# Patient Record
Sex: Male | Born: 1960 | Hispanic: No | Marital: Married | Smoking: Current every day smoker
Health system: Southern US, Community
[De-identification: ages and names within clinical notes are randomized; demographics above are authoritative.]

## PROBLEM LIST (undated history)

## (undated) DIAGNOSIS — M1990 Unspecified osteoarthritis, unspecified site: Secondary | ICD-10-CM

## (undated) DIAGNOSIS — G4733 Obstructive sleep apnea (adult) (pediatric): Secondary | ICD-10-CM

## (undated) DIAGNOSIS — M25569 Pain in unspecified knee: Secondary | ICD-10-CM

## (undated) DIAGNOSIS — E785 Hyperlipidemia, unspecified: Secondary | ICD-10-CM

## (undated) DIAGNOSIS — M48062 Spinal stenosis, lumbar region with neurogenic claudication: Secondary | ICD-10-CM

## (undated) DIAGNOSIS — M5137 Other intervertebral disc degeneration, lumbosacral region: Secondary | ICD-10-CM

## (undated) DIAGNOSIS — R7303 Prediabetes: Secondary | ICD-10-CM

## (undated) DIAGNOSIS — N200 Calculus of kidney: Secondary | ICD-10-CM

## (undated) DIAGNOSIS — G4730 Sleep apnea, unspecified: Secondary | ICD-10-CM

## (undated) DIAGNOSIS — I10 Essential (primary) hypertension: Secondary | ICD-10-CM

## (undated) DIAGNOSIS — G8929 Other chronic pain: Secondary | ICD-10-CM

## (undated) DIAGNOSIS — M51379 Other intervertebral disc degeneration, lumbosacral region without mention of lumbar back pain or lower extremity pain: Secondary | ICD-10-CM

## (undated) DIAGNOSIS — M549 Dorsalgia, unspecified: Secondary | ICD-10-CM

## (undated) DIAGNOSIS — R29898 Other symptoms and signs involving the musculoskeletal system: Secondary | ICD-10-CM

## (undated) DIAGNOSIS — F329 Major depressive disorder, single episode, unspecified: Secondary | ICD-10-CM

## (undated) DIAGNOSIS — F411 Generalized anxiety disorder: Secondary | ICD-10-CM

## (undated) DIAGNOSIS — F419 Anxiety disorder, unspecified: Secondary | ICD-10-CM

## (undated) DIAGNOSIS — Z87442 Personal history of urinary calculi: Secondary | ICD-10-CM

---

## 2006-09-02 ENCOUNTER — Ambulatory Visit: Payer: Self-pay | Admitting: Internal Medicine

## 2006-09-02 ENCOUNTER — Ambulatory Visit: Payer: Self-pay | Admitting: *Deleted

## 2011-10-18 ENCOUNTER — Emergency Department (HOSPITAL_COMMUNITY)
Admission: EM | Admit: 2011-10-18 | Discharge: 2011-10-19 | Disposition: A | Discharge status: Home / Self Care | Payer: Self-pay | Attending: Emergency Medicine | Admitting: Emergency Medicine

## 2011-10-18 ENCOUNTER — Emergency Department (HOSPITAL_COMMUNITY): Payer: Self-pay

## 2011-10-18 ENCOUNTER — Encounter (HOSPITAL_COMMUNITY): Payer: Self-pay | Admitting: *Deleted

## 2011-10-18 DIAGNOSIS — N200 Calculus of kidney: Secondary | ICD-10-CM | POA: Insufficient documentation

## 2011-10-18 DIAGNOSIS — R109 Unspecified abdominal pain: Secondary | ICD-10-CM | POA: Insufficient documentation

## 2011-10-18 DIAGNOSIS — F17200 Nicotine dependence, unspecified, uncomplicated: Secondary | ICD-10-CM | POA: Insufficient documentation

## 2011-10-18 DIAGNOSIS — I10 Essential (primary) hypertension: Secondary | ICD-10-CM | POA: Insufficient documentation

## 2011-10-18 DIAGNOSIS — Z79899 Other long term (current) drug therapy: Secondary | ICD-10-CM | POA: Insufficient documentation

## 2011-10-18 HISTORY — DX: Essential (primary) hypertension: I10

## 2011-10-18 HISTORY — DX: Pain in unspecified knee: M25.569

## 2011-10-18 HISTORY — DX: Other chronic pain: G89.29

## 2011-10-18 HISTORY — DX: Calculus of kidney: N20.0

## 2011-10-18 LAB — URINALYSIS, ROUTINE W REFLEX MICROSCOPIC
Ketones, ur: NEGATIVE mg/dL
Nitrite: NEGATIVE
Protein, ur: NEGATIVE mg/dL
pH: 5.5 (ref 5.0–8.0)

## 2011-10-18 LAB — URINE MICROSCOPIC-ADD ON

## 2011-10-18 NOTE — ED Notes (Signed)
Pt c/o R flank pain starting this morning. Pt states vomiting x 1 today. Pt denies hematuria at this time. Pt took ibuprofen at noon w/ slight relief.

## 2011-10-18 NOTE — ED Provider Notes (Signed)
History     CSN: [id number]  Arrival date & time 10/18/11  2145   First MD Initiated Contact with Patient 10/18/11 2301      Chief Complaint  Patient presents with  . Flank Pain    (Consider location/radiation/quality/duration/timing/severity/associated sxs/prior treatment) HPI Comments: Patient noticed, right flank pain.  That developed this morning.  It has waxed and waned in nature.  Since, then, at 9 PM tonight, approximately 2 hours ago.  Pain increased in severity.  Patient became nauseated and vomited times once at this time.  The pain is minimal.  He does have a history of kidney stands approximately 10 years ago.  He was hospitalized for this.  At that time.  He is unsure, if he passed the stone, but he's had no difficulty since he does have a history of high blood pressure, for which he takes atenolol and lisinopril.  He recently moved to [facility], and is looking to establish with a primary care physician  Patient is a 51 y.o. male presenting with flank pain. The history is provided by the patient.  Flank Pain This is a new problem. The current episode started today. The problem has been waxing and waning. Associated symptoms include nausea and vomiting. Pertinent negatives include no abdominal pain, change in bowel habit, coughing, fever or weakness. Nothing aggravates the symptoms. He has tried nothing for the symptoms.    Past Medical History  Diagnosis Date  . Kidney stone   . Hypertension   . Chronic knee pain     History reviewed. No pertinent past surgical history.  History reviewed. No pertinent family history.  History  Substance Use Topics  . Smoking status: Current Everyday Smoker -- 0.5 packs/day    Types: Cigarettes  . Smokeless tobacco: Not on file  . Alcohol Use: No      Review of Systems  Constitutional: Negative for fever.  Respiratory: Negative for cough.   Gastrointestinal: Positive for nausea and vomiting. Negative for abdominal pain and  change in bowel habit.  Genitourinary: Positive for flank pain. Negative for dysuria, hematuria and testicular pain.  Neurological: Negative for weakness.    Allergies  Review of patient's allergies indicates no known allergies.  Home Medications   Current Outpatient Rx  Name Route Sig Dispense Refill  . ATENOLOL 25 MG PO TABS Oral Take 25 mg by mouth 2 (two) times daily.    Marland Kitchen LISINOPRIL 20 MG PO TABS Oral Take 20 mg by mouth daily.    . MELOXICAM 15 MG PO TABS Oral Take 15 mg by mouth daily.    Marland Kitchen HYDROCODONE-ACETAMINOPHEN 5-500 MG PO TABS Oral Take 1-2 tablets by mouth every 6 (six) hours as needed for pain. 15 tablet 0  . TAMSULOSIN HCL 0.4 MG PO CAPS Oral Take 1 capsule (0.4 mg total) by mouth daily. 20 capsule 0    BP 188/74  Pulse 66  Temp 98.7 F (37.1 C) (Oral)  Resp 16  SpO2 100%  Physical Exam  Constitutional: He is oriented to person, place, and time. He appears well-developed and well-nourished.  HENT:  Head: Normocephalic.  Eyes: Pupils are equal, round, and reactive to light.  Neck: Normal range of motion.  Cardiovascular: Normal rate.   Pulmonary/Chest: Effort normal.  Abdominal: Soft. He exhibits no distension.  Genitourinary: Penis normal.  Musculoskeletal: Normal range of motion.       Arms: Neurological: He is alert and oriented to person, place, and time.  Skin: Skin is warm.  ED Course  Procedures (including critical care time)  Labs Reviewed  URINALYSIS, ROUTINE W REFLEX MICROSCOPIC - Abnormal; Notable for the following:    APPearance TURBID (*)     Hgb urine dipstick LARGE (*)     Leukocytes, UA SMALL (*)     All other components within normal limits  POCT I-STAT, CHEM 8 - Abnormal; Notable for the following:    Glucose, Bld 112 (*)     All other components within normal limits  URINE MICROSCOPIC-ADD ON   Ct Abdomen Pelvis Wo Contrast  10/18/2011  *RADIOLOGY REPORT*  Clinical Data: Right flank pain  CT ABDOMEN AND PELVIS WITHOUT  CONTRAST  Technique:  Multidetector CT imaging of the abdomen and pelvis was performed following the standard protocol without intravenous contrast.  Comparison: None.  Findings: Moderate right hydronephrosis and perinephric stranding. 3 mm calculus in the lower pole of the left kidney.  2 mm calculus at the right ureteral vesicle junction.  Normal appendix.  Decompressed gallbladder.  Liver, spleen, pancreas, adrenal glands are within normal limits.  No acute bony deformity.  IMPRESSION: Right hydronephrosis associated with a 2 mm right ureteral vesicle junction calculus.  Left nephrolithiasis.  Original Report Authenticated By: Donavan Burnet, M.D.     1. Kidney stone       MDM  Flank pain         Arman Filter, NP 10/19/11 1610  Arman Filter, NP 10/19/11 [phone]

## 2011-10-18 NOTE — ED Notes (Signed)
Report received from previous RN. Family at bedside. Pt gone to radiology.

## 2011-10-19 LAB — POCT I-STAT, CHEM 8
BUN: 21 mg/dL (ref 6–23)
Calcium, Ion: 1.16 mmol/L (ref 1.12–1.23)
Creatinine, Ser: 1.1 mg/dL (ref 0.50–1.35)
Glucose, Bld: 112 mg/dL — ABNORMAL HIGH (ref 70–99)
TCO2: 23 mmol/L (ref 0–100)

## 2011-10-19 MED ORDER — HYDROCODONE-ACETAMINOPHEN 5-500 MG PO TABS: 1.0000 | ORAL_TABLET | Freq: Four times a day (QID) | ORAL | Status: AC | PRN

## 2011-10-19 MED ORDER — TAMSULOSIN HCL 0.4 MG PO CAPS: 0.4000 mg | ORAL_CAPSULE | Freq: Every day | ORAL | Status: DC

## 2011-10-19 MED ORDER — TAMSULOSIN HCL 0.4 MG PO CAPS
0.4000 mg | ORAL_CAPSULE | Freq: Once | ORAL | Status: AC
  Administered 2011-10-19: 0.4 mg via ORAL
  Filled 2011-10-19: qty 1

## 2011-10-19 MED ORDER — KETOROLAC TROMETHAMINE 30 MG/ML IJ SOLN
30.0000 mg | Freq: Once | INTRAMUSCULAR | Status: AC
  Administered 2011-10-19: 30 mg via INTRAVENOUS
  Filled 2011-10-19: qty 1

## 2011-10-19 NOTE — ED Notes (Signed)
Pt becoming increasingly aggravated and wants to leave the ED. States he "has to be at work at [employer]." Have informed ERP of the pts status.

## 2011-10-19 NOTE — ED Notes (Signed)
Pt requesting update from ERP regarding CT scan results

## 2011-10-19 NOTE — ED Provider Notes (Signed)
Medical screening examination/treatment/procedure(s) were performed by non-physician practitioner and as supervising physician I was immediately available for consultation/collaboration.   Celene Kras, MD 10/19/11 [phone]

## 2011-10-31 ENCOUNTER — Ambulatory Visit: Payer: Self-pay | Admitting: Family Medicine

## 2011-11-07 ENCOUNTER — Ambulatory Visit (INDEPENDENT_AMBULATORY_CARE_PROVIDER_SITE_OTHER): Payer: Self-pay | Admitting: Family Medicine

## 2011-11-07 ENCOUNTER — Encounter: Payer: Self-pay | Admitting: Family Medicine

## 2011-11-07 VITALS — BP 190/83 | HR 60 | Ht 66.0 in | Wt 217.0 lb

## 2011-11-07 DIAGNOSIS — I10 Essential (primary) hypertension: Secondary | ICD-10-CM | POA: Insufficient documentation

## 2011-11-07 DIAGNOSIS — Z87442 Personal history of urinary calculi: Secondary | ICD-10-CM | POA: Insufficient documentation

## 2011-11-07 NOTE — Assessment & Plan Note (Signed)
Patient awaiting orange card - will increase Lisinopril to 40 mg daily. Continue Atenolol 25 BID. Red flags reviewed, warning signs discussed. Return to clinic after patient receives orange card.

## 2011-11-07 NOTE — Progress Notes (Signed)
  Subjective:    Christian [name] is a 51 y.o. male new patient to our practice, who presents for evaluation of elevated blood pressures.   Patient has not seen a primary care physician in over one year.  He has been taking blood pressure medications daily, goes to urgent care centers for refills. Cardiac symptoms: none. Patient denies: chest pain, chest pressure/discomfort, dyspnea, fatigue, irregular heart beat, lower extremity edema, near-syncope and syncope. Cardiovascular risk factors: male gender and smoking/ tobacco exposure. Use of agents associated with hypertension: none.  He does not adhere to a low-sodium diet, he does not exercise daily but works at [employer] and is on his feet most of the time.  Patient smokes about half a pack per day. He is interested in quitting.  I have reviewed patient's past medical history, family history, social history.   Review of Systems  Per history of present illness  Objective:    BP 190/83  Pulse 60  Ht 5\' 6"  (1.676 m)  Wt 217 lb (98.431 kg)  BMI 35.02 kg/m2 General appearance: alert, cooperative and no distress Lungs: clear to auscultation bilaterally Heart: regular rate and rhythm, S1, S2 normal, no murmur, click, rub or gallop Neuro:  no focal deficit  Assessment:     hypertension, poorly controlled.   Plan:    see problem list

## 2011-11-07 NOTE — Patient Instructions (Signed)
Take Lisinopril 2 tablets daily. Once you get orange card, schedule follow up appointment with PCP at your earliest convenience.

## 2012-11-03 ENCOUNTER — Encounter: Payer: Self-pay | Admitting: Diagnostic Neuroimaging

## 2012-11-03 ENCOUNTER — Ambulatory Visit (INDEPENDENT_AMBULATORY_CARE_PROVIDER_SITE_OTHER): Payer: No Typology Code available for payment source | Admitting: Diagnostic Neuroimaging

## 2012-11-03 VITALS — BP 131/74 | HR 57 | Temp 97.8°F | Ht 65.5 in | Wt 225.0 lb

## 2012-11-03 DIAGNOSIS — M4802 Spinal stenosis, cervical region: Secondary | ICD-10-CM | POA: Insufficient documentation

## 2012-11-03 DIAGNOSIS — M5010 Cervical disc disorder with radiculopathy, unspecified cervical region: Secondary | ICD-10-CM

## 2012-11-03 DIAGNOSIS — M5412 Radiculopathy, cervical region: Secondary | ICD-10-CM

## 2012-11-03 NOTE — Progress Notes (Signed)
[facility]  PATIENT: Christian [name] DOB: [birth year]  REFERRING CLINICIAN: Cephus Richer / Williams HISTORY FROM: patient (accompanied by wife and son) REASON FOR VISIT: new consult   HISTORICAL  CHIEF COMPLAINT:  Chief Complaint  Patient presents with  . Numbness    arms when sleeping, burning sensation  . Shaking    R hand    HISTORY OF PRESENT ILLNESS:   52 year old right-handed male here for evaluation of bilateral upper extremity numbness, weakness, burning sensation in the neck and abnormal MRI.  For past 2 years patient has developed intermittent numbness in his right and left arms, forearm region. He has noticed some progression of symptoms over time. He also feels some mild weakness in his arms. Also he notes a burning shooting pain from his mid cervical region down the length of his spine down to his feet when he tilts his head back and looks up. He had a similar sensation when he reaches his arms overhead. He denies any bowel or bladder dysfunction. No numbness or weakness in his abdomen, chest, lower extremities. No numbness or tingling in his feet or toes.  Patient was evaluated by PCP, who ordered MRI cervical, thoracic and lumbar spine, which were performed in each. Patient returns with films and reports, which mention multilevel degenerative changes with spinal cord compression in the cervical spine region mainly at C4-5, C5-6 and C6-7, with some cord signal abnormalities. Disc protrusion is also noted in the thoracic spine at T5-6, T6-7. Degenerative changes and lumbar spine also noted with multilevel lumbar spinal stenosis from L2 through the L4-5. Multilevel foraminal stenosis also noted cervical and lumbar regions.  REVIEW OF SYSTEMS: Full 14 system review of systems performed and notable only for snoring, numbness.  ALLERGIES: No Known Allergies  HOME MEDICATIONS: Prior to Admission medications   Medication Sig Start Date End Date Taking?  Authorizing Provider  atenolol (TENORMIN) 25 MG tablet Take 25 mg by mouth 2 (two) times daily.   Yes Historical Provider, MD  celecoxib (CELEBREX) 200 MG capsule Take 200 mg by mouth daily.   Yes Historical Provider, MD  lisinopril (PRINIVIL,ZESTRIL) 20 MG tablet Take 20 mg by mouth daily.   Yes Historical Provider, MD  lovastatin (MEVACOR) 40 MG tablet Take 40 mg by mouth at bedtime.   Yes Historical Provider, MD   Outpatient Prescriptions Prior to Visit  Medication Sig Dispense Refill  . atenolol (TENORMIN) 25 MG tablet Take 25 mg by mouth 2 (two) times daily.      Marland Kitchen lisinopril (PRINIVIL,ZESTRIL) 20 MG tablet Take 20 mg by mouth daily.      . meloxicam (MOBIC) 15 MG tablet Take 15 mg by mouth daily.      . Tamsulosin HCl (FLOMAX) 0.4 MG CAPS Take 1 capsule (0.4 mg total) by mouth daily.  20 capsule  0   No facility-administered medications prior to visit.    PAST MEDICAL HISTORY: Past Medical History  Diagnosis Date  . Hypertension   . Chronic knee pain   . Kidney stone August 2013    PAST SURGICAL HISTORY: History reviewed. No pertinent past surgical history.  FAMILY HISTORY: History reviewed. No pertinent family history.  SOCIAL HISTORY:  History   Social History  . Marital Status: Married    Spouse Name: [name]    Number of Children: 4  . Years of Education: College   Occupational History  .  [employer]   Social History Main Topics  . Smoking status: Current Every Day  Smoker -- 0.50 packs/day for 22 years    Types: Cigarettes  . Smokeless tobacco: Never Used  . Alcohol Use: No  . Drug Use: No  . Sexual Activity: Yes    Birth Control/ Protection: None   Other Topics Concern  . Not on file   Social History Narrative   Lives at home with family.   Caffeine Use: 1 cup daily     PHYSICAL EXAM  Filed Vitals:   11/03/12 1008  BP: 131/74  Pulse: 57  Temp: 97.8 F (36.6 C)  TempSrc: Oral  Height: 5' 5.5" (1.664 m)  Weight: 225 lb (102.059 kg)     Not recorded    Body mass index is 36.86 kg/(m^2).  GENERAL EXAM: Patient is in no distress  CARDIOVASCULAR: Regular rate and rhythm, no murmurs, no carotid bruits  NEUROLOGIC: MENTAL STATUS: awake, alert, language fluent, comprehension intact, naming intact CRANIAL NERVE: no papilledema on fundoscopic exam, pupils equal and reactive to light, visual fields full to confrontation, extraocular muscles intact, no nystagmus, facial sensation and strength symmetric, uvula midline, shoulder shrug symmetric, tongue midline. MOTOR: normal bulk and tone, BUE (TRICEPS 3-4, OTHERWISE 5). BLE 5.  SENSORY: normal and symmetric to light touch, pinprick, temperature, vibration COORDINATION: finger-nose-finger, fine finger movements normal REFLEXES: BUE 2, KNEES 3, ANKLES 2. UPGOING TOES BILATERALLY GAIT/STATION: narrow based gait; able to walk on toes, heels and tandem; romberg is negative   DIAGNOSTIC DATA (LABS, IMAGING, TESTING) - I reviewed patient records, labs, notes, testing and imaging myself where available.  Lab Results  Component Value Date   HGB 16.7 10/19/2011   HCT 49.0 10/19/2011      Component Value Date/Time   NA 138 10/19/2011 0029   K 3.5 10/19/2011 0029   CL 105 10/19/2011 0029   GLUCOSE 112* 10/19/2011 0029   BUN 21 10/19/2011 0029   CREATININE 1.10 10/19/2011 0029   No results found for this basename: CHOL, HDL, LDLCALC, LDLDIRECT, TRIG, CHOLHDL   No results found for this basename: HGBA1C   No results found for this basename: VITAMINB12   No results found for this basename: TSH    MRI cervical, thoracic and lumbar spine studies ([place], August 26, 2012): I reviewed imaging and reports myself. Posterior central disc herniations at C3-4, C4-5 and C5-6. Multilevel spinal stenosis and foraminal stenosis, most severe at C5-6 and C6-7. Cord signal abnormality is noted at C5-6. Focal disc protrusions at T5-6, T6-7, with mild distortion of the spinal cord without cord signal  abnormality. Multilevel degenerative changes in the lumbar spine L2-3 to L4-5 with mild to moderate spinal stenosis.   ASSESSMENT AND PLAN  52 y.o. year old male here with two-year history of bilateral upper extremity numbness and tingling, and one year history of intermittent Lhermitte's sign. Exam notable for bilateral triceps weakness and hyperreflexia in the knees with upgoing toes bilaterally. Imaging study confirms cervical spinal stenosis, cord compression and cord signal abnormalities, mainly at C5-6 level.   PLAN: 1. Recommend patient be evaluated by neurosurgery for possible decompression surgery 2. I reviewed imaging with patient and he agrees with plan  Orders Placed This Encounter  Procedures  . Ambulatory referral to Neurosurgery    Return in about 6 months (around 05/06/2013) for with Heide Guile or Ilaria Much.    Suanne Marker, MD 11/03/2012, 10:58 AM Certified in Neurology, Neurophysiology and Neuroimaging  [facility] [address], [address] [place], [place] [address] [phone]

## 2012-11-03 NOTE — Patient Instructions (Signed)
I will refer you to neurosurgery.

## 2012-11-15 ENCOUNTER — Other Ambulatory Visit: Payer: Self-pay | Admitting: Neurosurgery

## 2012-11-29 ENCOUNTER — Encounter (HOSPITAL_COMMUNITY): Payer: Self-pay

## 2012-11-29 ENCOUNTER — Ambulatory Visit (HOSPITAL_COMMUNITY)
Admission: RE | Admit: 2012-11-29 | Discharge: 2012-11-29 | Disposition: A | Discharge status: Home / Self Care | Payer: No Typology Code available for payment source | Source: Ambulatory Visit | Attending: Anesthesiology | Admitting: Anesthesiology

## 2012-11-29 ENCOUNTER — Encounter (HOSPITAL_COMMUNITY)
Admission: RE | Admit: 2012-11-29 | Discharge: 2012-11-29 | Disposition: A | Discharge status: Home / Self Care | Payer: No Typology Code available for payment source | Source: Ambulatory Visit | Attending: Neurosurgery | Admitting: Neurosurgery

## 2012-11-29 DIAGNOSIS — I498 Other specified cardiac arrhythmias: Secondary | ICD-10-CM | POA: Insufficient documentation

## 2012-11-29 DIAGNOSIS — Z01812 Encounter for preprocedural laboratory examination: Secondary | ICD-10-CM | POA: Insufficient documentation

## 2012-11-29 DIAGNOSIS — Z01810 Encounter for preprocedural cardiovascular examination: Secondary | ICD-10-CM | POA: Insufficient documentation

## 2012-11-29 DIAGNOSIS — I10 Essential (primary) hypertension: Secondary | ICD-10-CM | POA: Insufficient documentation

## 2012-11-29 DIAGNOSIS — Z01818 Encounter for other preprocedural examination: Secondary | ICD-10-CM | POA: Insufficient documentation

## 2012-11-29 HISTORY — DX: Personal history of urinary calculi: Z87.442

## 2012-11-29 HISTORY — DX: Anxiety disorder, unspecified: F41.9

## 2012-11-29 LAB — SURGICAL PCR SCREEN
MRSA, PCR: NEGATIVE
Staphylococcus aureus: NEGATIVE

## 2012-11-29 LAB — CBC
Hemoglobin: 15.6 g/dL (ref 13.0–17.0)
MCH: 30.4 pg (ref 26.0–34.0)
MCV: 83.5 fL (ref 78.0–100.0)
RBC: 5.14 MIL/uL (ref 4.22–5.81)
WBC: 4.8 10*3/uL (ref 4.0–10.5)

## 2012-11-29 LAB — BASIC METABOLIC PANEL: BUN: 12 mg/dL (ref 6–23)

## 2012-11-29 NOTE — Progress Notes (Signed)
11/29/12 1359  OBSTRUCTIVE SLEEP APNEA  Score 4 or greater  Results sent to PCP

## 2012-11-29 NOTE — Progress Notes (Signed)
11/29/12 1339  OBSTRUCTIVE SLEEP APNEA  Have you ever been diagnosed with sleep apnea through a sleep study? No  Do you snore loudly (loud enough to be heard through closed doors)?  0  Do you often feel tired, fatigued, or sleepy during the daytime? 0  Has anyone observed you stop breathing during your sleep? 0  Do you have, or are you being treated for high blood pressure? 1  BMI more than 35 kg/m2? 1  Age over 53 years old? 1  Neck circumference greater than 40 cm/18 inches? 0  Gender: 1  Obstructive Sleep Apnea Score 4  Score 4 or greater  Results sent to PCP

## 2012-11-29 NOTE — Progress Notes (Signed)
11/29/12 1359  OBSTRUCTIVE SLEEP APNEA  Have you ever been diagnosed with sleep apnea through a sleep study? No  Do you snore loudly (loud enough to be heard through closed doors)?  0  Do you often feel tired, fatigued, or sleepy during the daytime? 0  Has anyone observed you stop breathing during your sleep? 0  Do you have, or are you being treated for high blood pressure? 1  BMI more than 35 kg/m2? 1  Age over 53 years old? 1  Neck circumference greater than 40 cm/18 inches? 0  Gender: 1  Obstructive Sleep Apnea Score 4

## 2012-11-29 NOTE — Pre-Procedure Instructions (Addendum)
Christian [name]  11/29/2012   Your procedure is scheduled on: 12/02/12  Report to [facility] at 700AM.  Call this number if you have problems the morning of surgery: [phone]   Remember:   Do not eat food or drink liquids after midnight.   Take these medicines the morning of surgery with A SIP OF WATER: atenolol               STOP celebrex, any nsaids, aspirin, blood thinners, herbal meds now   Do not wear jewelry, make-up or nail polish.  Do not wear lotions, powders, or perfumes. You may wear deodorant.  Do not shave 48 hours prior to surgery. Men may shave face and neck.  Do not bring valuables to the hospital.  [facility] is not responsible                   for any belongings or valuables.  Contacts, dentures or bridgework may not be worn into surgery.  Leave suitcase in the car. After surgery it may be brought to your room.  For patients admitted to the hospital, checkout time is 11:00 AM the day of  discharge.   Patients discharged the day of surgery will not be allowed to drive  home.  Name and phone number of your driver:   Special Instructions: Shower using CHG 2 nights before surgery and the night before surgery.  If you shower the day of surgery use CHG.  Use special wash - you have one bottle of CHG for all showers.  You should use approximately 1/3 of the bottle for each shower.   Please read over the following fact sheets that you were given: Pain Booklet, Coughing and Deep Breathing and Surgical Site Infection Prevention

## 2012-12-01 MED ORDER — CEFAZOLIN SODIUM-DEXTROSE 2-3 GM-% IV SOLR
2.0000 g | INTRAVENOUS | Status: DC
  Filled 2012-12-01: qty 50

## 2012-12-02 ENCOUNTER — Observation Stay (HOSPITAL_COMMUNITY)
Admission: RE | Admit: 2012-12-02 | Discharge: 2012-12-03 | Disposition: A | Discharge status: Home / Self Care | Payer: No Typology Code available for payment source | Source: Ambulatory Visit | Attending: Neurosurgery | Admitting: Neurosurgery

## 2012-12-02 ENCOUNTER — Encounter (HOSPITAL_COMMUNITY): Payer: Self-pay | Admitting: Pharmacy Technician

## 2012-12-02 ENCOUNTER — Encounter (HOSPITAL_COMMUNITY)
Admission: RE | Disposition: A | Discharge status: Home / Self Care | Payer: Self-pay | Source: Ambulatory Visit | Attending: Neurosurgery

## 2012-12-02 ENCOUNTER — Encounter (HOSPITAL_COMMUNITY): Payer: Self-pay | Admitting: Anesthesiology

## 2012-12-02 ENCOUNTER — Encounter (HOSPITAL_COMMUNITY): Payer: Self-pay | Admitting: *Deleted

## 2012-12-02 ENCOUNTER — Inpatient Hospital Stay (HOSPITAL_COMMUNITY): Payer: No Typology Code available for payment source

## 2012-12-02 ENCOUNTER — Inpatient Hospital Stay (HOSPITAL_COMMUNITY): Payer: No Typology Code available for payment source | Admitting: Anesthesiology

## 2012-12-02 DIAGNOSIS — M5000 Cervical disc disorder with myelopathy, unspecified cervical region: Secondary | ICD-10-CM | POA: Insufficient documentation

## 2012-12-02 DIAGNOSIS — M4712 Other spondylosis with myelopathy, cervical region: Principal | ICD-10-CM | POA: Insufficient documentation

## 2012-12-02 DIAGNOSIS — I10 Essential (primary) hypertension: Secondary | ICD-10-CM | POA: Insufficient documentation

## 2012-12-02 HISTORY — PX: ANTERIOR CERVICAL DECOMPRESSION/DISCECTOMY FUSION 4 LEVELS: SHX5556

## 2012-12-02 SURGERY — ANTERIOR CERVICAL DECOMPRESSION/DISCECTOMY FUSION 4 LEVELS
Anesthesia: General | Site: Neck | Wound class: Clean

## 2012-12-02 MED ORDER — ZOLPIDEM TARTRATE 5 MG PO TABS: 5.0000 mg | ORAL_TABLET | Freq: Every evening | ORAL | Status: DC | PRN

## 2012-12-02 MED ORDER — PHENOL 1.4 % MT LIQD: 1.0000 | OROMUCOSAL | Status: DC | PRN

## 2012-12-02 MED ORDER — AMLODIPINE BESYLATE 10 MG PO TABS
10.0000 mg | ORAL_TABLET | Freq: Every day | ORAL | Status: DC
  Administered 2012-12-02 – 2012-12-03 (×2): 10 mg via ORAL
  Filled 2012-12-02 (×2): qty 1

## 2012-12-02 MED ORDER — ALUM & MAG HYDROXIDE-SIMETH 200-200-20 MG/5ML PO SUSP: 30.0000 mL | Freq: Four times a day (QID) | ORAL | Status: DC | PRN

## 2012-12-02 MED ORDER — DOCUSATE SODIUM 100 MG PO CAPS
100.0000 mg | ORAL_CAPSULE | Freq: Two times a day (BID) | ORAL | Status: DC
  Administered 2012-12-02 – 2012-12-03 (×2): 100 mg via ORAL
  Filled 2012-12-02 (×2): qty 1

## 2012-12-02 MED ORDER — ROCURONIUM BROMIDE 100 MG/10ML IV SOLN
INTRAVENOUS | Status: DC | PRN
  Administered 2012-12-02: 50 mg via INTRAVENOUS

## 2012-12-02 MED ORDER — NEOSTIGMINE METHYLSULFATE 1 MG/ML IJ SOLN
INTRAMUSCULAR | Status: DC | PRN
  Administered 2012-12-02: 3 mg via INTRAVENOUS

## 2012-12-02 MED ORDER — SENNA 8.6 MG PO TABS
1.0000 | ORAL_TABLET | Freq: Two times a day (BID) | ORAL | Status: DC
  Administered 2012-12-02 – 2012-12-03 (×2): 8.6 mg via ORAL
  Filled 2012-12-02 (×3): qty 1

## 2012-12-02 MED ORDER — THROMBIN 20000 UNITS EX SOLR
CUTANEOUS | Status: DC | PRN
  Administered 2012-12-02: 11:00:00 via TOPICAL

## 2012-12-02 MED ORDER — ARTIFICIAL TEARS OP OINT
TOPICAL_OINTMENT | OPHTHALMIC | Status: DC | PRN
  Administered 2012-12-02: 1 via OPHTHALMIC

## 2012-12-02 MED ORDER — SODIUM CHLORIDE 0.9 % IJ SOLN
3.0000 mL | Freq: Two times a day (BID) | INTRAMUSCULAR | Status: DC
  Administered 2012-12-02 – 2012-12-03 (×2): 3 mL via INTRAVENOUS

## 2012-12-02 MED ORDER — CEFAZOLIN SODIUM 1-5 GM-% IV SOLN
1.0000 g | Freq: Three times a day (TID) | INTRAVENOUS | Status: AC
  Administered 2012-12-02 – 2012-12-03 (×2): 1 g via INTRAVENOUS
  Filled 2012-12-02 (×2): qty 50

## 2012-12-02 MED ORDER — DEXAMETHASONE SODIUM PHOSPHATE 10 MG/ML IJ SOLN
INTRAMUSCULAR | Status: DC | PRN
  Administered 2012-12-02: 10 mg via INTRAVENOUS

## 2012-12-02 MED ORDER — FENTANYL CITRATE 0.05 MG/ML IJ SOLN
INTRAMUSCULAR | Status: DC | PRN
  Administered 2012-12-02: 25 ug via INTRAVENOUS
  Administered 2012-12-02: 125 ug via INTRAVENOUS
  Administered 2012-12-02: 50 ug via INTRAVENOUS

## 2012-12-02 MED ORDER — LIDOCAINE HCL (CARDIAC) 20 MG/ML IV SOLN
INTRAVENOUS | Status: DC | PRN
  Administered 2012-12-02: 100 mg via INTRAVENOUS

## 2012-12-02 MED ORDER — HYDROCODONE-ACETAMINOPHEN 5-325 MG PO TABS: 1.0000 | ORAL_TABLET | ORAL | Status: DC | PRN

## 2012-12-02 MED ORDER — EPHEDRINE SULFATE 50 MG/ML IJ SOLN
INTRAMUSCULAR | Status: DC | PRN
  Administered 2012-12-02 (×2): 15 mg via INTRAVENOUS
  Administered 2012-12-02: 10 mg via INTRAVENOUS

## 2012-12-02 MED ORDER — ACETAMINOPHEN 650 MG RE SUPP: 650.0000 mg | RECTAL | Status: DC | PRN

## 2012-12-02 MED ORDER — ONDANSETRON HCL 4 MG/2ML IJ SOLN
INTRAMUSCULAR | Status: DC | PRN
  Administered 2012-12-02: 4 mg via INTRAVENOUS

## 2012-12-02 MED ORDER — GLYCOPYRROLATE 0.2 MG/ML IJ SOLN
INTRAMUSCULAR | Status: DC | PRN
  Administered 2012-12-02: 0.2 mg via INTRAVENOUS
  Administered 2012-12-02: .6 mg via INTRAVENOUS

## 2012-12-02 MED ORDER — KCL IN DEXTROSE-NACL 20-5-0.45 MEQ/L-%-% IV SOLN
INTRAVENOUS | Status: DC
  Administered 2012-12-02: 19:00:00 via INTRAVENOUS
  Filled 2012-12-02 (×3): qty 1000

## 2012-12-02 MED ORDER — MENTHOL 3 MG MT LOZG
1.0000 | LOZENGE | OROMUCOSAL | Status: DC | PRN
  Administered 2012-12-03: 3 mg via ORAL
  Filled 2012-12-02: qty 9

## 2012-12-02 MED ORDER — ACETAMINOPHEN 325 MG PO TABS: 650.0000 mg | ORAL_TABLET | ORAL | Status: DC | PRN

## 2012-12-02 MED ORDER — FLEET ENEMA 7-19 GM/118ML RE ENEM
1.0000 | ENEMA | Freq: Once | RECTAL | Status: AC | PRN
  Filled 2012-12-02: qty 1

## 2012-12-02 MED ORDER — LISINOPRIL 20 MG PO TABS
20.0000 mg | ORAL_TABLET | Freq: Two times a day (BID) | ORAL | Status: DC
  Administered 2012-12-02 – 2012-12-03 (×2): 20 mg via ORAL
  Filled 2012-12-02 (×3): qty 1

## 2012-12-02 MED ORDER — SODIUM CHLORIDE 0.9 % IJ SOLN: 3.0000 mL | INTRAMUSCULAR | Status: DC | PRN

## 2012-12-02 MED ORDER — DIAZEPAM 5 MG/ML IJ SOLN
2.5000 mg | Freq: Once | INTRAMUSCULAR | Status: AC
  Administered 2012-12-02 (×2): 2.5 mg via INTRAVENOUS

## 2012-12-02 MED ORDER — LACTATED RINGERS IV SOLN
INTRAVENOUS | Status: DC
  Administered 2012-12-02: 08:00:00 via INTRAVENOUS

## 2012-12-02 MED ORDER — MIDAZOLAM HCL 5 MG/5ML IJ SOLN
INTRAMUSCULAR | Status: DC | PRN
  Administered 2012-12-02: 2 mg via INTRAVENOUS

## 2012-12-02 MED ORDER — PHENYLEPHRINE HCL 10 MG/ML IJ SOLN
INTRAMUSCULAR | Status: DC | PRN
  Administered 2012-12-02: 80 ug via INTRAVENOUS
  Administered 2012-12-02: 40 ug via INTRAVENOUS
  Administered 2012-12-02 (×4): 80 ug via INTRAVENOUS
  Administered 2012-12-02: 120 ug via INTRAVENOUS

## 2012-12-02 MED ORDER — LIDOCAINE-EPINEPHRINE 1 %-1:100000 IJ SOLN
INTRAMUSCULAR | Status: DC | PRN
  Administered 2012-12-02: 5 mL

## 2012-12-02 MED ORDER — ONDANSETRON HCL 4 MG/2ML IJ SOLN: 4.0000 mg | Freq: Once | INTRAMUSCULAR | Status: DC | PRN

## 2012-12-02 MED ORDER — ONDANSETRON HCL 4 MG/2ML IJ SOLN
4.0000 mg | INTRAMUSCULAR | Status: DC | PRN
  Administered 2012-12-02: 4 mg via INTRAVENOUS
  Filled 2012-12-02: qty 2

## 2012-12-02 MED ORDER — CEFAZOLIN SODIUM 1-5 GM-% IV SOLN
INTRAVENOUS | Status: AC
  Administered 2012-12-02: 2 g via INTRAVENOUS
  Filled 2012-12-02: qty 100

## 2012-12-02 MED ORDER — DIAZEPAM 5 MG/ML IJ SOLN
INTRAMUSCULAR | Status: AC
  Filled 2012-12-02: qty 2

## 2012-12-02 MED ORDER — DIAZEPAM 5 MG PO TABS
5.0000 mg | ORAL_TABLET | Freq: Four times a day (QID) | ORAL | Status: DC | PRN
  Administered 2012-12-03 (×2): 5 mg via ORAL
  Filled 2012-12-02 (×2): qty 1

## 2012-12-02 MED ORDER — LACTATED RINGERS IV SOLN
INTRAVENOUS | Status: DC | PRN
  Administered 2012-12-02 (×3): via INTRAVENOUS

## 2012-12-02 MED ORDER — ATENOLOL 50 MG PO TABS
50.0000 mg | ORAL_TABLET | Freq: Every day | ORAL | Status: DC
  Administered 2012-12-03: 50 mg via ORAL
  Filled 2012-12-02 (×2): qty 1

## 2012-12-02 MED ORDER — BISACODYL 10 MG RE SUPP: 10.0000 mg | Freq: Every day | RECTAL | Status: DC | PRN

## 2012-12-02 MED ORDER — BUPIVACAINE HCL (PF) 0.5 % IJ SOLN
INTRAMUSCULAR | Status: DC | PRN
  Administered 2012-12-02: 5 mL

## 2012-12-02 MED ORDER — PROPOFOL 10 MG/ML IV BOLUS
INTRAVENOUS | Status: DC | PRN
  Administered 2012-12-02: 200 mg via INTRAVENOUS

## 2012-12-02 MED ORDER — LORCASERIN HCL 10 MG PO TABS: 10.0000 mg | ORAL_TABLET | Freq: Two times a day (BID) | ORAL | Status: DC

## 2012-12-02 MED ORDER — HYDROMORPHONE HCL PF 1 MG/ML IJ SOLN
0.2500 mg | INTRAMUSCULAR | Status: DC | PRN
  Administered 2012-12-02: 0.5 mg via INTRAVENOUS

## 2012-12-02 MED ORDER — HYDROMORPHONE HCL PF 1 MG/ML IJ SOLN
INTRAMUSCULAR | Status: AC
  Administered 2012-12-02: 0.5 mg
  Filled 2012-12-02: qty 1

## 2012-12-02 MED ORDER — POLYETHYLENE GLYCOL 3350 17 G PO PACK
17.0000 g | PACK | Freq: Every day | ORAL | Status: DC | PRN
  Filled 2012-12-02: qty 1

## 2012-12-02 MED ORDER — MORPHINE SULFATE 2 MG/ML IJ SOLN
1.0000 mg | INTRAMUSCULAR | Status: DC | PRN
  Administered 2012-12-02: 4 mg via INTRAVENOUS
  Filled 2012-12-02: qty 2

## 2012-12-02 MED ORDER — 0.9 % SODIUM CHLORIDE (POUR BTL) OPTIME
TOPICAL | Status: DC | PRN
  Administered 2012-12-02: 1000 mL

## 2012-12-02 MED ORDER — SIMVASTATIN 20 MG PO TABS
20.0000 mg | ORAL_TABLET | Freq: Every day | ORAL | Status: DC
  Administered 2012-12-02: 20 mg via ORAL
  Filled 2012-12-02 (×2): qty 1

## 2012-12-02 MED ORDER — CITALOPRAM HYDROBROMIDE 20 MG PO TABS
20.0000 mg | ORAL_TABLET | Freq: Every day | ORAL | Status: DC
  Administered 2012-12-02 – 2012-12-03 (×2): 20 mg via ORAL
  Filled 2012-12-02 (×2): qty 1

## 2012-12-02 MED ORDER — PANTOPRAZOLE SODIUM 40 MG IV SOLR
40.0000 mg | Freq: Every day | INTRAVENOUS | Status: DC
  Administered 2012-12-02: 40 mg via INTRAVENOUS
  Filled 2012-12-02 (×2): qty 40

## 2012-12-02 MED ORDER — OXYCODONE-ACETAMINOPHEN 5-325 MG PO TABS
1.0000 | ORAL_TABLET | ORAL | Status: DC | PRN
  Administered 2012-12-03 (×3): 2 via ORAL
  Filled 2012-12-02 (×3): qty 2

## 2012-12-02 SURGICAL SUPPLY — 79 items
ADH SKN CLS APL DERMABOND .7 (GAUZE/BANDAGES/DRESSINGS)
ADH SKN CLS LQ APL DERMABOND (GAUZE/BANDAGES/DRESSINGS) ×1
APL SKNCLS STERI-STRIP NONHPOA (GAUZE/BANDAGES/DRESSINGS)
BAG DECANTER FOR FLEXI CONT (MISCELLANEOUS) ×2 IMPLANT
BANDAGE GAUZE ELAST BULKY 4 IN (GAUZE/BANDAGES/DRESSINGS) ×2 IMPLANT
BENZOIN TINCTURE PRP APPL 2/3 (GAUZE/BANDAGES/DRESSINGS) IMPLANT
BIT DRILL 14MM (INSTRUMENTS) IMPLANT
BIT DRILL NEURO 2X3.1 SFT TUCH (MISCELLANEOUS) ×1 IMPLANT
BLADE ULTRA TIP 2M (BLADE) ×1 IMPLANT
BUR BARREL STRAIGHT FLUTE 4.0 (BURR) ×2 IMPLANT
CANISTER SUCTION 2500CC (MISCELLANEOUS) ×2 IMPLANT
CLOTH BEACON ORANGE TIMEOUT ST (SAFETY) ×2 IMPLANT
CONT SPEC 4OZ CLIKSEAL STRL BL (MISCELLANEOUS) ×2 IMPLANT
COVER MAYO STAND STRL (DRAPES) ×2 IMPLANT
DERMABOND ADHESIVE PROPEN (GAUZE/BANDAGES/DRESSINGS) ×1
DERMABOND ADVANCED (GAUZE/BANDAGES/DRESSINGS)
DERMABOND ADVANCED .7 DNX12 (GAUZE/BANDAGES/DRESSINGS) ×1 IMPLANT
DERMABOND ADVANCED .7 DNX6 (GAUZE/BANDAGES/DRESSINGS) IMPLANT
DRAIN JACKSON PRATT 10MM FLAT (MISCELLANEOUS) ×1 IMPLANT
DRAPE LAPAROTOMY 100X72 PEDS (DRAPES) ×2 IMPLANT
DRAPE MICROSCOPE LEICA (MISCELLANEOUS) ×2 IMPLANT
DRAPE POUCH INSTRU U-SHP 10X18 (DRAPES) ×2 IMPLANT
DRAPE PROXIMA HALF (DRAPES) IMPLANT
DRESSING TELFA 8X3 (GAUZE/BANDAGES/DRESSINGS) IMPLANT
DRILL 14MM (INSTRUMENTS) ×2
DRILL NEURO 2X3.1 SOFT TOUCH (MISCELLANEOUS) ×2
DRSG OPSITE POSTOP 4X6 (GAUZE/BANDAGES/DRESSINGS) ×1 IMPLANT
DURAPREP 6ML APPLICATOR 50/CS (WOUND CARE) ×2 IMPLANT
ELECT COATED BLADE 2.86 ST (ELECTRODE) ×2 IMPLANT
ELECT REM PT RETURN 9FT ADLT (ELECTROSURGICAL) ×2
ELECTRODE REM PT RTRN 9FT ADLT (ELECTROSURGICAL) ×1 IMPLANT
EVACUATOR SILICONE 100CC (DRAIN) ×1 IMPLANT
GAUZE SPONGE 4X4 16PLY XRAY LF (GAUZE/BANDAGES/DRESSINGS) IMPLANT
GLOVE BIO SURGEON STRL SZ8 (GLOVE) ×2 IMPLANT
GLOVE BIOGEL PI IND STRL 7.0 (GLOVE) IMPLANT
GLOVE BIOGEL PI IND STRL 8 (GLOVE) ×1 IMPLANT
GLOVE BIOGEL PI IND STRL 8.5 (GLOVE) ×1 IMPLANT
GLOVE BIOGEL PI INDICATOR 7.0 (GLOVE) ×1
GLOVE BIOGEL PI INDICATOR 8 (GLOVE) ×1
GLOVE BIOGEL PI INDICATOR 8.5 (GLOVE) ×1
GLOVE ECLIPSE 7.5 STRL STRAW (GLOVE) ×6 IMPLANT
GLOVE ECLIPSE 8.0 STRL XLNG CF (GLOVE) ×1 IMPLANT
GLOVE EXAM NITRILE LRG STRL (GLOVE) IMPLANT
GLOVE EXAM NITRILE MD LF STRL (GLOVE) IMPLANT
GLOVE EXAM NITRILE XL STR (GLOVE) IMPLANT
GLOVE EXAM NITRILE XS STR PU (GLOVE) IMPLANT
GOWN BRE IMP SLV AUR LG STRL (GOWN DISPOSABLE) ×1 IMPLANT
GOWN BRE IMP SLV AUR XL STRL (GOWN DISPOSABLE) ×1 IMPLANT
GOWN STRL REIN 2XL LVL4 (GOWN DISPOSABLE) ×2 IMPLANT
HEAD HALTER (SOFTGOODS) ×2 IMPLANT
KIT BASIN OR (CUSTOM PROCEDURE TRAY) ×2 IMPLANT
KIT ROOM TURNOVER OR (KITS) ×2 IMPLANT
NDL HYPO 18GX1.5 BLUNT FILL (NEEDLE) ×1 IMPLANT
NDL HYPO 25X1 1.5 SAFETY (NEEDLE) ×1 IMPLANT
NDL SPNL 22GX3.5 QUINCKE BK (NEEDLE) ×2 IMPLANT
NEEDLE HYPO 18GX1.5 BLUNT FILL (NEEDLE) IMPLANT
NEEDLE HYPO 25X1 1.5 SAFETY (NEEDLE) ×2 IMPLANT
NEEDLE SPNL 22GX3.5 QUINCKE BK (NEEDLE) ×6 IMPLANT
NS IRRIG 1000ML POUR BTL (IV SOLUTION) ×2 IMPLANT
PACK LAMINECTOMY NEURO (CUSTOM PROCEDURE TRAY) ×2 IMPLANT
PAD ARMBOARD 7.5X6 YLW CONV (MISCELLANEOUS) ×4 IMPLANT
PIN DISTRACTION 14MM (PIN) ×4 IMPLANT
PLATE 68MM SPINAL (Plate) ×1 IMPLANT
RUBBERBAND STERILE (MISCELLANEOUS) ×4 IMPLANT
SCREW 14MM (Screw) ×10 IMPLANT
SPACER ASSEM CERV LORD 7M (Spacer) ×2 IMPLANT
SPACER ASSEM CERV LORD 8M (Spacer) ×2 IMPLANT
SPONGE GAUZE 4X4 12PLY (GAUZE/BANDAGES/DRESSINGS) IMPLANT
SPONGE INTESTINAL PEANUT (DISPOSABLE) ×2 IMPLANT
SPONGE SURGIFOAM ABS GEL 100 (HEMOSTASIS) ×1 IMPLANT
STAPLER SKIN PROX WIDE 3.9 (STAPLE) IMPLANT
STRIP CLOSURE SKIN 1/2X4 (GAUZE/BANDAGES/DRESSINGS) IMPLANT
SUT VIC AB 3-0 SH 8-18 (SUTURE) ×3 IMPLANT
SYR 20ML ECCENTRIC (SYRINGE) ×2 IMPLANT
SYR 3ML LL SCALE MARK (SYRINGE) ×1 IMPLANT
TOWEL OR 17X24 6PK STRL BLUE (TOWEL DISPOSABLE) ×2 IMPLANT
TOWEL OR 17X26 10 PK STRL BLUE (TOWEL DISPOSABLE) ×2 IMPLANT
TRAP SPECIMEN MUCOUS 40CC (MISCELLANEOUS) IMPLANT
WATER STERILE IRR 1000ML POUR (IV SOLUTION) ×2 IMPLANT

## 2012-12-02 NOTE — Brief Op Note (Addendum)
12/02/2012  2:48 PM  PATIENT:  Christian [name]  52 y.o. male  PRE-OPERATIVE DIAGNOSIS:  Cervical spondylosis with myelopathy, Cervical degenerative disc disease, Cervical stenosis with cord compression, Cervicalgia, cervical radiculopathy C 34, C 45, C 56, C 67  POST-OPERATIVE DIAGNOSIS:  Cervical spondylosis with myelopathy, Cervical degenerative disc disease, Cervical stenosis with cord compression, Cervicalgia, cervical radiculopathy C 34, C 45, C 56, C 67   PROCEDURE:  Procedure(s) with comments: Cervical three-four, Cervical four-five, Cervical five-six, Cervical six-seven Anterior cervical decompression/diskectomy/fusion (N/A) - Cervical three-four, Cervical four-five, Cervical five-six, Cervical six-seven Anterior cervical decompression/diskectomy/fusion with allograft, autograft, plate  SURGEON:  Surgeon(s) and Role:    * Maeola Harman, MD - Primary  PHYSICIAN ASSISTANT: Phoebe Perch, MD  ASSISTANTS: Poteat, RN   ANESTHESIA:   general  EBL:  Total I/O In: 2250 [I.V.:2250] Out: 100 [Blood:100]  BLOOD ADMINISTERED:none  DRAINS: (#10) Jackson-Pratt drain(s) with closed bulb suction in the prevertebral space   LOCAL MEDICATIONS USED:  LIDOCAINE   SPECIMEN:  No Specimen  DISPOSITION OF SPECIMEN:  N/A  COUNTS:  YES  TOURNIQUET:  * No tourniquets in log *  DICTATION: Patient was brought to operating room and following the smooth and uncomplicated induction of general endotracheal anesthesia his head was placed on a horseshoe head holder he was placed in 5 pounds of Holter traction and his anterior neck was prepped and draped in usual sterile fashion. An incision was made on the left side of midline after infiltrating the skin and subcutaneous tissues with local lidocaine. The platysmal layer was incised and subplatysmal dissection was performed exposing the anterior border sternocleidomastoid muscle. Using blunt dissection the carotid sheath was kept lateral and trachea and esophagus  kept medial exposing the anterior cervical spine. A bent spinal needle was placed it was felt to be the C 34 level and C 45 levels and this was confirmed on intraoperative x-ray. Longus coli muscles were taken down from the anterior cervical spine using electrocautery and key elevator and self-retaining retractor was placed. The interspaces from C 34 to C 67  were incised and a thorough discectomies were performed. Large ventral osteophytes were removed. Distraction pins were placed at the C 56 level. Uncinate spurs and central spondylitic ridges were drilled down with a high-speed drill. The spinal cord dura and both C 6 nerve roots were widely decompressed. Hemostasis was assured. After trial sizing a 7 mm machined lordotic bone wedge was selected and packed with autograft. The graft was tamped into position and countersunk appropriately.  Distraction pins were placed at the C 67 level. Uncinate spurs and central spondylitic ridges were drilled down with a high-speed drill. The spinal cord dura and both C7 nerve roots were widely decompressed. Hemostasis was assured. After trial sizing a similarly sized implant was selected and packed with autograft. The graft was tamped into position and countersunk appropriately. The retractor was moved and the interspace at C 34 was incised and a thorough discectomy was performed. Distraction pins were placed. Uncinate spurs and central spondylitic ridges were drilled down with a high-speed drill. The spinal cord dura and both C 4 nerve roots were widely decompressed. Hemostasis was assured. After trial sizing an 8 mm graft was selected and packed with autograft. The graft was tamped into position and countersunk appropriately.The interspace at C 45 was incised and a thorough discectomy was performed. Distraction pins were placed. Uncinate spurs and central spondylitic ridges were drilled down with a high-speed drill.   This interspace was overgrown with  bone and it was drilled  open with the high speed drill. The spinal cord dura and both C 5 nerve roots were widely decompressed. Hemostasis was assured. After trial sizing an 8 mm graft was selected and packed with autograft. The graft was tamped into position and countersunk appropriately.  Distraction weight was removed. A 68 mm trestle luxe anterior cervical plate was affixed to the cervical spine with 14 mm variable-angle screws 2 at C3, 2 at C4, 2 at C5,  and 2 at C6, and 2 at C7.  All screws were well-positioned and locking mechanisms were engaged. Soft tissues were inspected and found to be in good repair. The wound was irrigated. A final x-ray was obtained with good visualization at C3 through C 5 with the interbody grafts well visualized. A #10 JP drain was inserted through a separate stab incision. The platysma layer was closed with 3-0 Vicryl stitches and the skin was reapproximated with 3-0 Vicryl subcuticular stitches. The wound was dressed with benzoin and steristrips. Counts were correct at the end of the case. Patient was extubated and taken to recovery in stable and satisfactory condition.   PLAN OF CARE: Admit for overnight observation  PATIENT DISPOSITION:  PACU - hemodynamically stable.   Delay start of Pharmacological VTE agent (>24hrs) due to surgical blood loss or risk of bleeding: yes

## 2012-12-02 NOTE — H&P (Signed)
Patient ID: [id number] Patient: Christian [name] Date of Birth: 04/20/1960 Visit Type: Office Visit Date: 11/15/2012 08:30 AM Provider: Danae Orleans. Venetia Maxon  Historian: self This 52 year old male presents for Follow Up of neck pain. HISTORY OF PRESENT ILLNESS: 1. Follow Up of neck pain Review xrays. Pt reports numbness/tingling BUE for 1 year, s/s worsening. Some "burning" sensations in his back. Surgery has been recommended, but pt wanted to see Dr. Venetia Maxon. Patient was concerned that surgery been recommended and he did not understand why. He notes that he is having bilateral upper shortening numbness and tingling and feels "heat" in his neck and arms and his body. He also notes numbness in both of his arms. He complains of weakness in both of his arms and he says this has been going on for about a year. He also complains of bilateral knee pain which she says relates to cartilage issues. He uses Celebrex daily and he says helps him with his knees. He runs a [employer] and is extremely active and crux. He is a smoker. Cervical MRI was reviewed which demonstrates significant multilevel stenosis with canal diameter in the AP plane at 7.4 mm at the C3-C4 level, 6.3 mm at C4-C5, 5.7 mm at C5-C6 with increased cord signal at this level and 6.3 mm at the C6-C7 level. The C3 level appears normal at 12.6 mm. Plain radiographs were reviewed which demonstrates multilevel spondylosis at C3-C4 through C6-C7 levels without evidence of subluxation. There is a left-sided foraminal stenosis at C3-C4 C5-C6 and C6-C7 levels and right-sided foraminal stenosis at C67 and C7-T1 levels. PAST MEDICAL HISTORY, SURGICAL HISTORY, FAMILY HISTORY, SOCIAL HISTORY AND REVIEW OF SYSTEMS I have reviewed the patient's past medical, surgical, family and social history as well as the comprehensive review of systems as included on the [facility] history form dated 11/15/2012, which I have signed. FAMILY  HISTORY Patient reports there is no relevant family history. Medications(added, continued or stopped this visit):   Medication Dose Prescribed Else Ind Started Stopped  amlodipine 10 mg tablet 10 mg Y    atenolol 50 mg tablet 50 mg Y    Belviq 10 mg tablet 10 mg Y    Celebrex 200 mg capsule 200 mg Y    citalopram 20 mg tablet 20 mg Y    lisinopril 20 mg tablet 20 mg Y    lovastatin 40 mg tablet 40 mg Y    Allergies: Ingredient Reaction Medication Name Comment  NO KNOWN ALLERGIES     No known allergies. REVIEW OF SYSTEMS: System Neg/Pos Details  Constitutional Negative Chills, fatigue, fever, malaise, night sweats, weight gain and weight loss.  ENMT Negative Ear drainage, hearing loss, nasal drainage, otalgia, sinus pressure and sore throat.  Eyes Negative Eye discharge, eye pain and vision changes.  Respiratory Negative Chronic cough, cough, dyspnea, known TB exposure and wheezing.  Cardio Positive High blood pressure.  Cardio Negative Chest pain, claudication, edema and irregular heartbeat/palpitations.  GI Negative Abdominal pain, blood in stool, change in stool pattern, constipation, decreased appetite, diarrhea, heartburn, nausea and vomiting.  GU Negative Dribbling, dysuria, erectile dysfunction, hematuria, polyuria, slow stream, urinary frequency, urinary incontinence and urinary retention.  Endocrine Negative Cold intolerance, heat intolerance, polydipsia and polyphagia.  Neuro Negative Dizziness, extremity weakness, gait disturbance, headache, memory impairment, numbness in extremities, seizures and tremors.  Psych Negative Anxiety, depression and insomnia.  Integumentary Negative Brittle hair, brittle nails, change in shape/size of mole(s), hair loss, hirsutism, hives, pruritus, rash and  skin lesion.  MS Positive Back pain, Joint pain, Joint swelling, Muscle weakness, Neck pain.  Hema/Lymph Negative Easy bleeding, easy bruising and lymphadenopathy.  Allergic/Immuno Negative  Contact allergy, environmental allergies, food allergies and seasonal allergies.  Reproductive Negative Penile discharge and sexual dysfunction.  Vitals Date Temp F BP Pulse Ht In Wt Lb BMI BSA Pain Score  11/15/2012    66 210 33.89    11/15/2012  122/68 70       PHYSICAL EXAM General Level of Distress: no acute distress Overall Appearance: normal Cardiovascular Cardiac: regular rate and rhythm without murmur Right Left  Carotid Pulses: normal normal Respiratory Lungs: clear to auscultation Neurological Orientation: normal Recent and Remote Memory: normal Attention Span and Concentration: normal Language: normal Fund of Knowledge: normal Right Left Sensation: normal normal Upper Extremity Coordination: normal normal  Lower Extremity Coordination: normal normal Musculoskeletal Gait and Station: normal Right Left Upper Extremity Muscle Strength: normal normal Lower Extremity Muscle Strength: normal normal Upper Extremity Muscle Tone: normal normal Lower Extremity Muscle Tone: normal normal Motor Strength Upper and lower extremity motor strength was tested in the clinically pertinent muscles .Any abnormal findings will be noted below.. Right Left Biceps: 4+/5 Triceps: 4/5 4-/5 Wrist Extensor: 4/5 4-/5 Grip: 4-/5 4-/5 Finger Extensor: 4-/5 4-/5 Deep Tendon Reflexes Right Left Biceps: increase increase Triceps: increase increase Brachiloradialis: increase increase Patellar: increase increase Achilles: increase increase Sensory Sensation was tested at C2 to S1 .Any abnormal findings will be noted below.. Right Left C8: hyperpathic Cranial Nerves II. Optic Nerve/Visual Fields: normal III. Oculomotor: normal IV. Trochlear: normal V. Trigeminal: normal VI. Abducens: normal VII. Facial: normal VIII. Acoustic/Vestibular: normal IX. Glossopharyngeal: normal X. Vagus: normal XI. Spinal Accessory: normal XII. Hypoglossal: normal Motor and other Tests Lhermittes:  positive Rhomberg: negative Pronator drift: absent Right Left Spurlings negative negative Hoffman's: present normal Clonus: normal normal Babinski: normal normal SLR: negative negative Patrick's Pearlean Brownie): negative negative Toe Walk: normal normal Toe Lift: normal normal Heel Walk: normal normal Tinels Elbow: negative negative Tinels Wrist: negative negative Phalen: negative negative Additional Findings: Patient notes hypalgesia to pinprick in upper and lower extremities except in the left index finger which has increased sensation. DIAGNOSTIC RESULTS Cervical MRI was reviewed which demonstrates significant multilevel stenosis with canal diameter in the AP plane at 7.4 mm at the C3-C4 level, 6.3 mm at C4-C5, 5.7 mm at C5-C6 with increased cord signal at this level and 6.3 mm at the C6-C7 level. The C3 level appears normal at 12.6 mm. Plain radiographs were reviewed which demonstrates multilevel spondylosis at C3-C4 through C6-C7 levels without evidence of subluxation. There is a left-sided foraminal stenosis at C3-C4 C5-C6 and C6-C7 levels and right-sided foraminal stenosis at C67 and C7-T1 levels. IMPRESSION Patient has significant symptomatic cervical myelopathy with hyperreflexia in both upper extremities and marked hyperreflexia in both lower tremors he is with positive Hawkins sign in the right upper extremity. He has marked opportunity weakness in both hands and left greater than right triceps and biceps secondary to cervical myelopathy. He has evidence of cord compression at C3-C4 C4-C5 C5-C6 and C6-C7 levels. There is increased cord signal at the C56. Assessment/Plan # Detail Type Description  1. Assessment Pain/neck (723.1).      2. Assessment Cervical disc degeneration (722.4).      3. Assessment Cervical spinal stenosis (723.0).      4. Assessment Cervical spondylosis w/ myelopathy (721.1).      I explained to the patient that there is little choice but to  have surgery given  the severity of his cervical spinal cord compression at multiple levels. I have recommended that he undergo anterior cervical decompression and fusion at the C3-C4 C4-C5 C5-C6 and C6-C7 levels. The patient is aware of the risks and benefits of surgery and understands the needs to stop smoking. He wishes to proceed. He has been fitted for a distal cervical collar. We spent extensive time (greater than 45 minutes) discussing the details of surgery and the attendant risks and benefits and answering his questions as well as going over ligature and models. Patient wishes to proceed with surgery on 12/02/2012. Orders: Diagnostic Procedures: Assessment Procedure  721.1 ACDF - C3-C4 - C4-C5 - C5-C6 - C6-C7  Provider: Danae Orleans. Venetia Maxon 11/25/2012 08:58 PM Dictation edited by: Danae Orleans. Venetia Maxon

## 2012-12-02 NOTE — Progress Notes (Signed)
Patient awake, alert, conversant.  Full strength both upper extremities.  Having posterior neck discomfort.  Otherwise doing well.

## 2012-12-02 NOTE — Anesthesia Procedure Notes (Signed)
Procedure Name: Intubation Date/Time: 12/02/2012 10:31 AM Performed by: Carmela Rima Pre-anesthesia Checklist: Patient identified, Timeout performed, Emergency Drugs available, Suction available and Patient being monitored Patient Re-evaluated:Patient Re-evaluated prior to inductionOxygen Delivery Method: Circle system utilized Preoxygenation: Pre-oxygenation with 100% oxygen Intubation Type: IV induction Ventilation: Mask ventilation without difficulty Laryngoscope Size: Mac and 4 Grade View: Grade II Tube type: Oral Tube size: 7.5 mm Number of attempts: 2 Placement Confirmation: positive ETCO2,  breath sounds checked- equal and bilateral and ETT inserted through vocal cords under direct vision Secured at: 22 cm Tube secured with: Tape Dental Injury: Teeth and Oropharynx as per pre-operative assessment

## 2012-12-02 NOTE — Transfer of Care (Signed)
Immediate Anesthesia Transfer of Care Note  Patient: Christian [name]  Procedure(s) Performed: Procedure(s) with comments: Cervical three-four, Cervical four-five, Cervical five-six, Cervical six-seven Anterior cervical decompression/diskectomy/fusion (N/A) - Cervical three-four, Cervical four-five, Cervical five-six, Cervical six-seven Anterior cervical decompression/diskectomy/fusion  Patient Location: PACU  Anesthesia Type:General  Level of Consciousness: oriented  Airway & Oxygen Therapy: Patient Spontanous Breathing and Patient connected to nasal cannula oxygen  Post-op Assessment: Report given to PACU RN, Post -op Vital signs reviewed and stable and Patient moving all extremities X 4  Post vital signs: Reviewed and stable  Complications: No apparent anesthesia complications

## 2012-12-02 NOTE — Anesthesia Preprocedure Evaluation (Addendum)
Anesthesia Evaluation  Patient identified by MRN, date of birth, ID band Patient awake    Reviewed: Allergy & Precautions, H&P , NPO status , Patient's Chart, lab work & pertinent test results  Airway Mallampati: I TM Distance: >3 FB Neck ROM: full    Dental  (+) Dental Advidsory Given and Teeth Intact   Pulmonary Current Smoker,          Cardiovascular hypertension, Rhythm:regular Rate:Normal     Neuro/Psych    GI/Hepatic   Endo/Other    Renal/GU Renal disease     Musculoskeletal   Abdominal   Peds  Hematology   Anesthesia Other Findings   Reproductive/Obstetrics                          Anesthesia Physical Anesthesia Plan  ASA: II  Anesthesia Plan: General   Post-op Pain Management:    Induction: Intravenous  Airway Management Planned: Oral ETT  Additional Equipment:   Intra-op Plan:   Post-operative Plan: Extubation in OR  Informed Consent: I have reviewed the patients History and Physical, chart, labs and discussed the procedure including the risks, benefits and alternatives for the proposed anesthesia with the patient or authorized representative who has indicated his/her understanding and acceptance.     Plan Discussed with: CRNA, Anesthesiologist and Surgeon  Anesthesia Plan Comments:         Anesthesia Quick Evaluation

## 2012-12-02 NOTE — Progress Notes (Signed)
Orthopedic Tech Progress Note Patient Details:  Christian [name] 1960/05/04 [id number]  Ortho Devices Type of Ortho Device: Soft collar Ortho Device/Splint Interventions: Application   Shawnie Pons 12/02/2012, 4:07 PM

## 2012-12-02 NOTE — Plan of Care (Signed)
Problem: Consults Goal: Diagnosis - Spinal Surgery Outcome: Completed/Met Date Met:  12/02/12 Cervical Spine Fusion

## 2012-12-02 NOTE — Op Note (Signed)
12/02/2012  2:48 PM  PATIENT:  Christian [name]  51 y.o. male  PRE-OPERATIVE DIAGNOSIS:  Cervical spondylosis with myelopathy, Cervical degenerative disc disease, Cervical stenosis with cord compression, Cervicalgia, cervical radiculopathy C 34, C 45, C 56, C 67  POST-OPERATIVE DIAGNOSIS:  Cervical spondylosis with myelopathy, Cervical degenerative disc disease, Cervical stenosis with cord compression, Cervicalgia, cervical radiculopathy C 34, C 45, C 56, C 67   PROCEDURE:  Procedure(s) with comments: Cervical three-four, Cervical four-five, Cervical five-six, Cervical six-seven Anterior cervical decompression/diskectomy/fusion (N/A) - Cervical three-four, Cervical four-five, Cervical five-six, Cervical six-seven Anterior cervical decompression/diskectomy/fusion with allograft, autograft, plate  SURGEON:  Surgeon(s) and Role:    * Xaviera Flaten, MD - Primary  PHYSICIAN ASSISTANT: Hirsch, MD  ASSISTANTS: Poteat, RN   ANESTHESIA:   general  EBL:  Total I/O In: 2250 [I.V.:2250] Out: 100 [Blood:100]  BLOOD ADMINISTERED:none  DRAINS: (#10) Jackson-Pratt drain(s) with closed bulb suction in the prevertebral space   LOCAL MEDICATIONS USED:  LIDOCAINE   SPECIMEN:  No Specimen  DISPOSITION OF SPECIMEN:  N/A  COUNTS:  YES  TOURNIQUET:  * No tourniquets in log *  DICTATION: Patient was brought to operating room and following the smooth and uncomplicated induction of general endotracheal anesthesia his head was placed on a horseshoe head holder he was placed in 5 pounds of Holter traction and his anterior neck was prepped and draped in usual sterile fashion. An incision was made on the left side of midline after infiltrating the skin and subcutaneous tissues with local lidocaine. The platysmal layer was incised and subplatysmal dissection was performed exposing the anterior border sternocleidomastoid muscle. Using blunt dissection the carotid sheath was kept lateral and trachea and esophagus  kept medial exposing the anterior cervical spine. A bent spinal needle was placed it was felt to be the C 34 level and C 45 levels and this was confirmed on intraoperative x-ray. Longus coli muscles were taken down from the anterior cervical spine using electrocautery and key elevator and self-retaining retractor was placed. The interspaces from C 34 to C 67  were incised and a thorough discectomies were performed. Large ventral osteophytes were removed. Distraction pins were placed at the C 56 level. Uncinate spurs and central spondylitic ridges were drilled down with a high-speed drill. The spinal cord dura and both C 6 nerve roots were widely decompressed. Hemostasis was assured. After trial sizing a 7 mm machined lordotic bone wedge was selected and packed with autograft. The graft was tamped into position and countersunk appropriately.  Distraction pins were placed at the C 67 level. Uncinate spurs and central spondylitic ridges were drilled down with a high-speed drill. The spinal cord dura and both C7 nerve roots were widely decompressed. Hemostasis was assured. After trial sizing a similarly sized implant was selected and packed with autograft. The graft was tamped into position and countersunk appropriately. The retractor was moved and the interspace at C 34 was incised and a thorough discectomy was performed. Distraction pins were placed. Uncinate spurs and central spondylitic ridges were drilled down with a high-speed drill. The spinal cord dura and both C 4 nerve roots were widely decompressed. Hemostasis was assured. After trial sizing an 8 mm graft was selected and packed with autograft. The graft was tamped into position and countersunk appropriately.The interspace at C 45 was incised and a thorough discectomy was performed. Distraction pins were placed. Uncinate spurs and central spondylitic ridges were drilled down with a high-speed drill.   This interspace was overgrown with   bone and it was drilled  open with the high speed drill. The spinal cord dura and both C 5 nerve roots were widely decompressed. Hemostasis was assured. After trial sizing an 8 mm graft was selected and packed with autograft. The graft was tamped into position and countersunk appropriately.  Distraction weight was removed. A 68 mm trestle luxe anterior cervical plate was affixed to the cervical spine with 14 mm variable-angle screws 2 at C3, 2 at C4, 2 at C5,  and 2 at C6, and 2 at C7.  All screws were well-positioned and locking mechanisms were engaged. Soft tissues were inspected and found to be in good repair. The wound was irrigated. A final x-ray was obtained with good visualization at C3 through C 5 with the interbody grafts well visualized. A #10 JP drain was inserted through a separate stab incision. The platysma layer was closed with 3-0 Vicryl stitches and the skin was reapproximated with 3-0 Vicryl subcuticular stitches. The wound was dressed with benzoin and steristrips. Counts were correct at the end of the case. Patient was extubated and taken to recovery in stable and satisfactory condition.   PLAN OF CARE: Admit for overnight observation  PATIENT DISPOSITION:  PACU - hemodynamically stable.   Delay start of Pharmacological VTE agent (>24hrs) due to surgical blood loss or risk of bleeding: yes  

## 2012-12-02 NOTE — Interval H&P Note (Signed)
History and Physical Interval Note:  12/02/2012 7:08 AM  Christian [name]  has presented today for surgery, with the diagnosis of Cervical spondylosis with myelopathy, Cervical degenerative disc disease, Cervical stenosis, Cervicalgia  The various methods of treatment have been discussed with the patient and family. After consideration of risks, benefits and other options for treatment, the patient has consented to  Procedure(s) with comments: ANTERIOR CERVICAL DECOMPRESSION/DISCECTOMY FUSION 4 LEVELS (N/A) - C3-4 C4-5 C5-6 C6-7 Anterior cervical decompression/diskectomy/fusion as a surgical intervention .  The patient's history has been reviewed, patient examined, no change in status, stable for surgery.  I have reviewed the patient's chart and labs.  Questions were answered to the patient's satisfaction.     Lancelot Alyea D

## 2012-12-02 NOTE — Anesthesia Postprocedure Evaluation (Signed)
  Anesthesia Post-op Note  Patient: Christian [name]  Procedure(s) Performed: Procedure(s) with comments: Cervical three-four, Cervical four-five, Cervical five-six, Cervical six-seven Anterior cervical decompression/diskectomy/fusion (N/A) - Cervical three-four, Cervical four-five, Cervical five-six, Cervical six-seven Anterior cervical decompression/diskectomy/fusion  Patient Location: PACU  Anesthesia Type:General  Level of Consciousness: awake, oriented, sedated and patient cooperative  Airway and Oxygen Therapy: Patient Spontanous Breathing  Post-op Pain: mild  Post-op Assessment: Post-op Vital signs reviewed, Patient's Cardiovascular Status Stable, Respiratory Function Stable, Patent Airway, No signs of Nausea or vomiting and Pain level controlled  Post-op Vital Signs: stable  Complications: No apparent anesthesia complications

## 2012-12-03 ENCOUNTER — Encounter (HOSPITAL_COMMUNITY): Payer: Self-pay | Admitting: Neurosurgery

## 2012-12-03 MED ORDER — DIAZEPAM 5 MG PO TABS: 5.0000 mg | ORAL_TABLET | Freq: Four times a day (QID) | ORAL | Status: DC | PRN

## 2012-12-03 MED ORDER — OXYCODONE-ACETAMINOPHEN 5-325 MG PO TABS: 1.0000 | ORAL_TABLET | ORAL | Status: DC | PRN

## 2012-12-03 NOTE — Progress Notes (Signed)
Patient doing well.  OK to D/C home. 

## 2012-12-03 NOTE — Progress Notes (Signed)
PT Cancellation Note  Patient Details Name: Christian [name] MRN: [id number] DOB: May 11, 1960   Cancelled Treatment:    Reason Eval/Treat Not Completed: PT screened, no needs identified, will sign off   Bryahna Lesko 12/03/2012, 9:02 AM

## 2012-12-03 NOTE — Progress Notes (Signed)
Pt. discharged home accompanied by family. Prescriptions and discharge instructions given with verbalization of understanding. Incision site on neck with no s/s of infection - no swelling, redness, bleeding, and/or drainage noted. Soft collar intact. Opportunity given to ask questions but no question asked. Pt. transported out of this unit in wheelchair by the volunteer.

## 2012-12-03 NOTE — Discharge Summary (Signed)
Physician Discharge Summary  Patient ID: Christian [name] MRN: [id number] DOB/AGE: [birth year] 53 y.o.  Admit date: 12/02/2012 Discharge date: 12/03/2012  Admission Diagnoses: Cervical spondylosis with myelopathy, Cervical degenerative disc disease, Cervical stenosis with cord compression, Cervicalgia, cervical radiculopathy C 34, C 45, C 56, C 67   Discharge Diagnoses: Cervical spondylosis with myelopathy, Cervical degenerative disc disease, Cervical stenosis with cord compression, Cervicalgia, cervical radiculopathy C 34, C 45, C 56, C 67 s/p Cervical three-four, Cervical four-five, Cervical five-six, Cervical six-seven Anterior cervical decompression/diskectomy/fusion (N/A) - Cervical three-four, Cervical four-five, Cervical five-six, Cervical six-seven Anterior cervical decompression/diskectomy/fusion with allograft, autograft, plate   Active Problems:   * No active hospital problems. *   Discharged Condition: good  Hospital Course: [name] was admitted for surgery with Dx cervical spondylosis with cord compression and myelopathy. Following uncomplicated ACDF C3-4, 4-5, 5-6, 6-7, he recovered nicely in Neuro PACU & transferred to 3500 for observation. He has progressed well.  Consults: None  Significant Diagnostic Studies: radiology: X-Ray: intra-operative  Treatments: surgery: Cervical three-four, Cervical four-five, Cervical five-six, Cervical six-seven Anterior cervical decompression/diskectomy/fusion (N/A) - Cervical three-four, Cervical four-five, Cervical five-six, Cervical six-seven Anterior cervical decompression/diskectomy/fusion with allograft, autograft, plate    Discharge Exam: Blood pressure 150/82, pulse 64, temperature 98.9 F (37.2 C), temperature source Oral, resp. rate 20, SpO2 92.00%. Alert, conversant. Wife present. Soft collar in use. Reports no dysphagia. Reports some posterior cervical and bilat trap pain - reassured. Good strength BUE and hand intrinsics.  Reports mild numbness in right wrist/hand. Incision with Dermabond. No erythema, swelling, or drainage. JP o/p 30ml overnight.    Disposition: 01-Home or Self Care Discharge to home. Pt verbalized understanding of d/c instructions & agrees to call office to schedule f/u appt with Dr. Venetia Maxon for 3-4 weeks.     Future Appointments Provider Department Dept Phone   05/04/2013 10:00 AM Ronal Fear, NP [facility] [phone]       Medication List    ASK your doctor about these medications       amLODipine 10 MG tablet  Commonly known as:  NORVASC  Take 10 mg by mouth daily.     atenolol 50 MG tablet  Commonly known as:  TENORMIN  Take 50 mg by mouth daily.     BELVIQ 10 MG Tabs  Generic drug:  Lorcaserin HCl  Take 10 mg by mouth 2 (two) times daily.     celecoxib 200 MG capsule  Commonly known as:  CELEBREX  Take 200 mg by mouth daily.     citalopram 20 MG tablet  Commonly known as:  CELEXA  Take 20 mg by mouth daily.     lisinopril 20 MG tablet  Commonly known as:  PRINIVIL,ZESTRIL  Take 20 mg by mouth 2 (two) times daily.     lovastatin 40 MG tablet  Commonly known as:  MEVACOR  Take 40 mg by mouth at bedtime.         Signed: Georgiann Cocker 12/03/2012, 8:25 AM

## 2012-12-03 NOTE — Evaluation (Signed)
Occupational Therapy Evaluation and Discharge Summary Patient Details Name: Sonya Pucci MRN: [id number] DOB: 10/18/1960 Today's Date: 12/03/2012 Time: 3244-0102 OT Time Calculation (min): 18 min  OT Assessment / Plan / Recommendation History of present illness Pt is a 52 yo male who underwent a C3-C7 decompression, diskectomy and fusion for cervical radiculopathy, spondylosis and myopathy.  Pt to wear soft collar for now.   Clinical Impression   Pt admitted for cervical surgery but overall is doing well with adls and ambulation.  Pt has wife at home to assist when needed but did encourage her to let pt do for himself as much as possible.  Pt with not further OT or PT needs at this time.    OT Assessment  Patient does not need any further OT services    Follow Up Recommendations  No OT follow up    Barriers to Discharge      Equipment Recommendations  None recommended by OT    Recommendations for Other Services    Frequency       Precautions / Restrictions Precautions Precautions: Cervical Required Braces or Orthoses: Cervical Brace Cervical Brace: At all times Restrictions Weight Bearing Restrictions: No   Pertinent Vitals/Pain Pt c/o 4/10 pain in neck.  Pt received pain meds this am before therapy which has helped with pain but has made pt dizzy.  Nurse aware.    ADL  Eating/Feeding: Performed;Independent Where Assessed - Eating/Feeding: Chair Grooming: Performed;Independent Where Assessed - Grooming: Unsupported standing Upper Body Bathing: Simulated;Set up Where Assessed - Upper Body Bathing: Unsupported sitting Lower Body Bathing: Simulated;Minimal assistance Where Assessed - Lower Body Bathing: Unsupported sit to stand Upper Body Dressing: Performed;Minimal assistance Where Assessed - Upper Body Dressing: Unsupported sitting Lower Body Dressing: Performed;Minimal assistance Where Assessed - Lower Body Dressing: Unsupported sit to stand Toilet Transfer:  Performed;Modified independent Toilet Transfer Method: Other (comment) (ambulated to br) Toilet Transfer Equipment: Comfort height toilet;Grab bars Toileting - Clothing Manipulation and Hygiene: Performed;Independent Where Assessed - Toileting Clothing Manipulation and Hygiene: Standing Transfers/Ambulation Related to ADLs: Pt walked down hallway with S and up and down 2 steps with rail with distant supervision.  No LOB.  Education provided to go up and down steps with assist to begin with since neck brace prevents him from seeing the steps well. ADL Comments: Pt does well with most adls.  Educated on bathing and dressing techniques.  Wife is quick to assist.  Instructed her to let pt do as much for himself as possible.  Min assist required for donning socks at this time due to pain when reaching forward with arms.    OT Diagnosis:    OT Problem List:   OT Treatment Interventions:     OT Goals(Current goals can be found in the care plan section) Acute Rehab OT Goals Patient Stated Goal: to get rid of the numbness in my R hand.  Visit Information  Last OT Received On: 12/03/12 Assistance Needed: +1 History of Present Illness: Pt is a 52 yo male who underwent a C3-C7 decompression, diskectomy and fusion for cervical radiculopathy, spondylosis and myopathy.  Pt to wear soft collar for now.       Prior Functioning     Home Living Family/patient expects to be discharged to:: Private residence Living Arrangements: Spouse/significant other Available Help at Discharge: Family;Available 24 hours/day Type of Home: House Home Access: Stairs to enter [employer] of Steps: 2 Entrance Stairs-Rails: None Home Layout: Two level;Able to live on main level with bedroom/bathroom  Alternate Level Stairs-Number of Steps: 14 Alternate Level Stairs-Rails: Left Home Equipment: None Prior Function Level of Independence: Independent Comments: pt works in [employer].  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 Communication Communication: Prefers language other than English;Other (comment) (speaks good English) Dominant Hand: Right         Vision/Perception     Cognition  Cognition Arousal/Alertness: Awake/alert Behavior During Therapy: WFL for tasks assessed/performed Overall Cognitive Status: Within Functional Limits for tasks assessed    Extremity/Trunk Assessment Upper Extremity Assessment Upper Extremity Assessment: Overall WFL for tasks assessed (limited testing due to new cervical sugery) Lower Extremity Assessment Lower Extremity Assessment: Overall WFL for tasks assessed     Mobility Bed Mobility Bed Mobility: Supine to Sit;Sitting - Scoot to Edge of Bed Supine to Sit: 7: Independent Sitting - Scoot to [place] of Bed: 7: Independent Transfers Transfers: Sit to Stand;Stand to Sit Sit to Stand: 7: Independent Stand to Sit: 7: Independent Details for Transfer Assistance: safe with ambulation.  S provided.  Pt stated that pain meds make him a bit dizzy.     Exercise     Balance Balance Balance Assessed: Yes Dynamic Standing Balance Dynamic Standing - Balance Support: Bilateral upper extremity supported Dynamic Standing - Level of Assistance: 5: Stand by assistance   End of Session OT - End of Session Equipment Utilized During Treatment: Cervical collar Activity Tolerance: Patient tolerated treatment well Patient left: in chair;with family/visitor present;with call bell/phone within reach Nurse Communication: Mobility status;Other (comment) (Pt with no PT needs at this time.)  GO Functional Assessment Tool Used: clinical judgement Functional Limitation: Self care Self Care Current Status ([id number]): At least 1 percent but less than 20 percent impaired, limited or restricted Self Care Goal Status (W2956): At least 1 percent but less than 20 percent impaired, limited or restricted Self Care Discharge Status [phone]): At least 1 percent but less than 20 percent  impaired, limited or restricted   Hope Budds 12/03/2012, 9:07 AM [phone]

## 2012-12-03 NOTE — Progress Notes (Signed)
Subjective: Patient reports "My neck and shoulders hurt some"  Objective: Vital signs in last 24 hours: Temp:  [97.1 F (36.2 C)-99.3 F (37.4 C)] 98.9 F (37.2 C) (09/12 0806) Pulse Rate:  [63-78] 64 (09/12 0806) Resp:  [18-28] 20 (09/12 0806) BP: (143-170)/(72-84) 150/82 mmHg (09/12 0806) SpO2:  [92 %-99 %] 92 % (09/12 0806)  Intake/Output from previous day: 09/11 0701 - 09/12 0700 In: 2880 [P.O.:480; I.V.:2400] Out: 165 [Drains:65; Blood:100] Intake/Output this shift: Total I/O In: -  Out: 30 [Drains:30]  Alert, conversant. Wife present. Soft collar in use. Reports no dysphagia. Reports some posterior cervical and bilat trap pain - reassured. Good strength BUE and hand intrinsics. Reports mild numbness in right wrist/hand. Incision with Dermabond. No erythema, swelling, or drainage. JP o/p 30ml overnight.  Lab Results: No results found for this basename: WBC, HGB, HCT, PLT,  in the last 72 hours BMET No results found for this basename: NA, K, CL, CO2, GLUCOSE, BUN, CREATININE, CALCIUM,  in the last 72 hours  Studies/Results: Dg Cervical Spine 2-3 Views  12/02/2012   CLINICAL DATA:  ACDF.  EXAM: CERVICAL SPINE - 2-3 VIEW  COMPARISON:  11/15/2012  FINDINGS: 1st lateral intraoperative image demonstrates anterior localizing instruments directed at the C3-4 and C4-5 level.  Second lateral intraoperative image demonstrates anterior cervical fusion seen at C3 and C4. This extends further inferiorly, but not visualized due to overlying shoulders.  IMPRESSION: Reported four level ACDF, which can only be visualized at C3 and C4.   Electronically Signed   By: Charlett Nose M.D.   On: 12/02/2012 15:32    Assessment/Plan: Improving   LOS: 1 day  Per Dr. Venetia Maxon, continue to mobilize. JP pulled, DSD applied. Pt verbalized understanding of d/c instructions & agrees to call office to schedule f/u appt with Dr. Venetia Maxon for 3-4 weeks.   Georgiann Cocker 12/03/2012, 8:19 AM

## 2013-03-18 IMAGING — CT CT ABD-PELV W/O CM
1 series · 14 of 18 positions shown, 20 images · non-contrast
Comparison: None.

CLINICAL DATA: Right flank pain

CT ABDOMEN AND PELVIS WITHOUT CONTRAST
TECHNIQUE: Multidetector CT imaging of the abdomen and pelvis was
performed following the standard protocol without intravenous
contrast.

[Series 4: lung · axial · 0.78mm/px · z∈[+1316,+1390]mm · 14 of 18 slices shown, 20 images]
[im 2/18  soft-tissue]
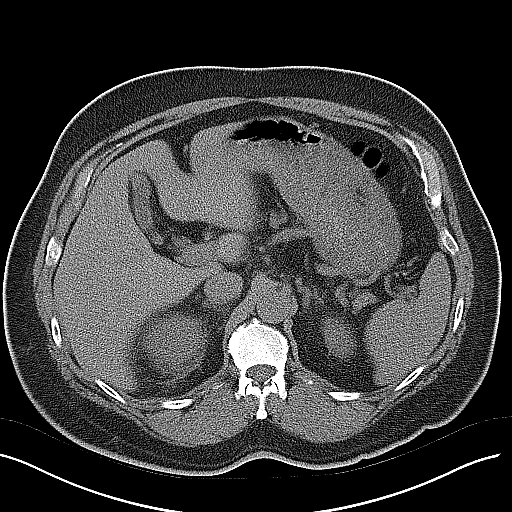
[im 2/18  bone]
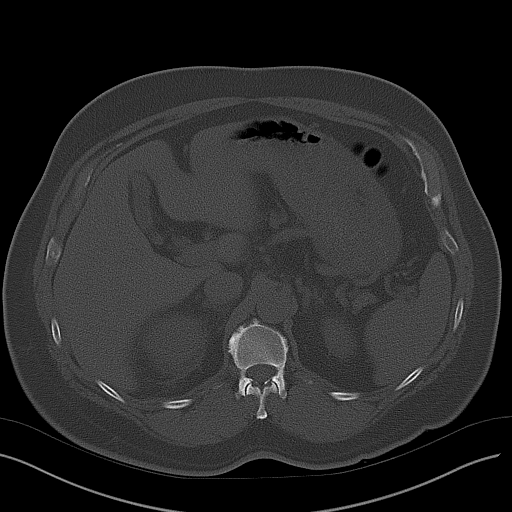
[im 3/18  soft-tissue]
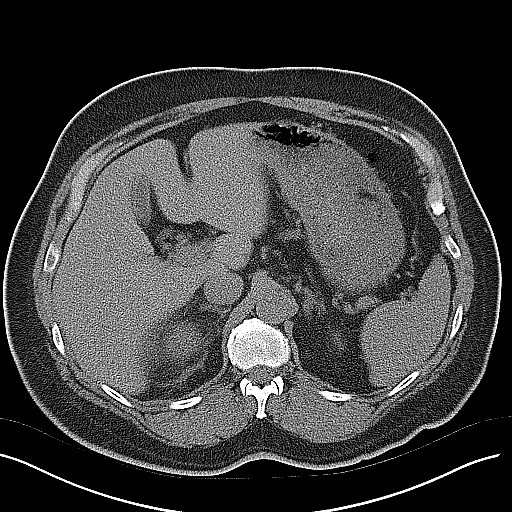
[im 4/18  soft-tissue]
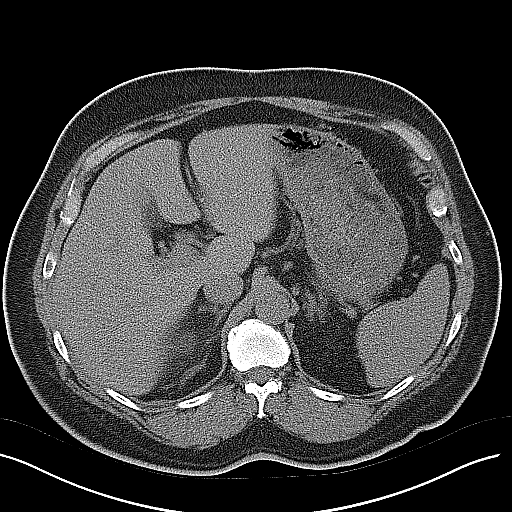
[im 6/18  soft-tissue]
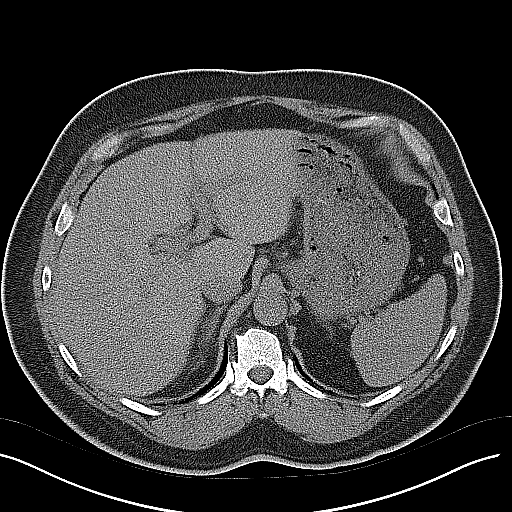
[im 7/18  soft-tissue]
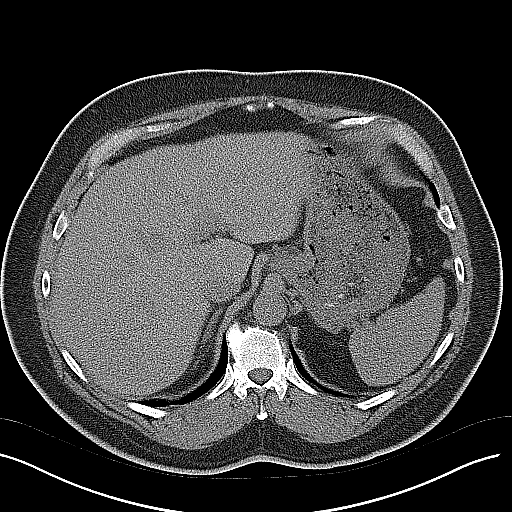
[im 8/18  soft-tissue]
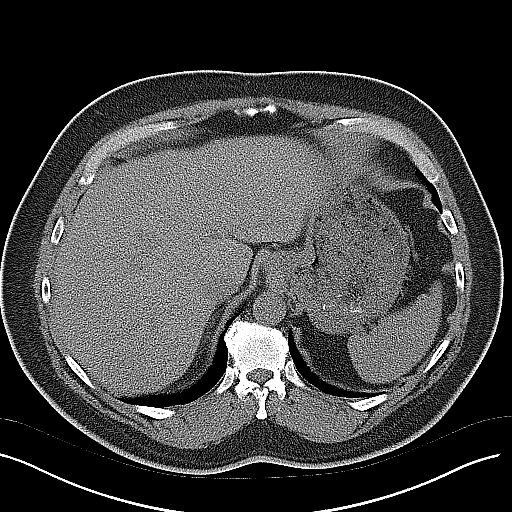
[im 9/18  soft-tissue]
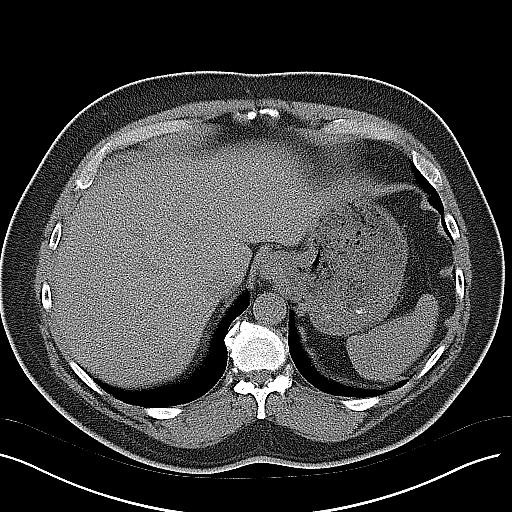
[im 10/18  soft-tissue]
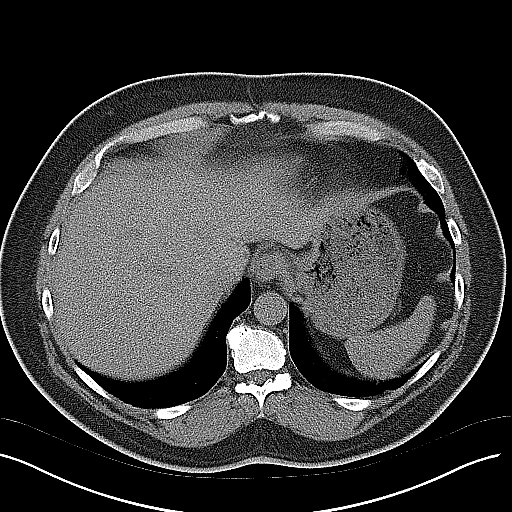
[im 11/18  soft-tissue]
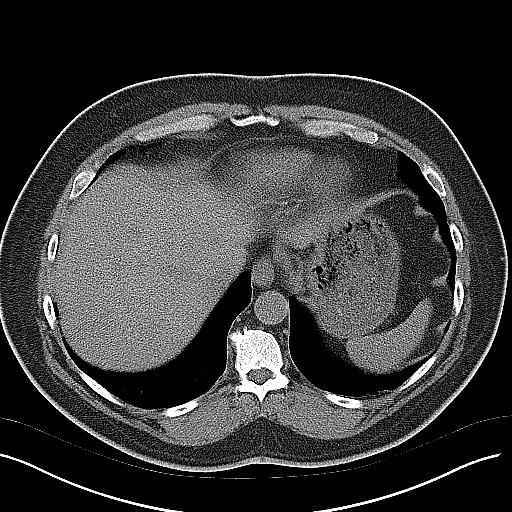
[im 11/18  bone]
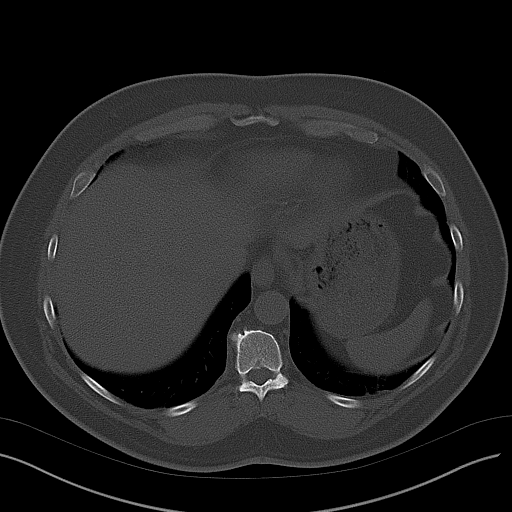
[im 12/18  soft-tissue]
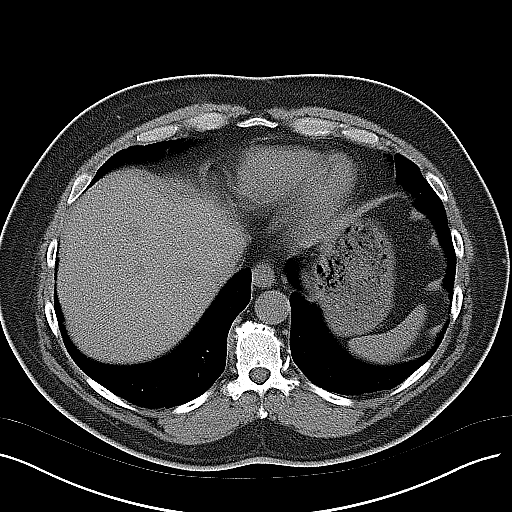
[im 14/18  soft-tissue]
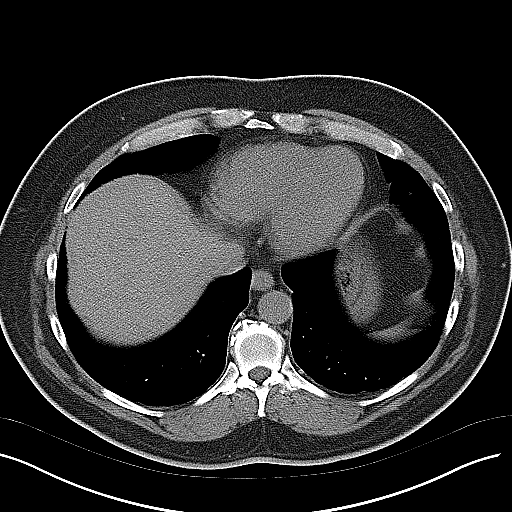
[im 14/18  lung]
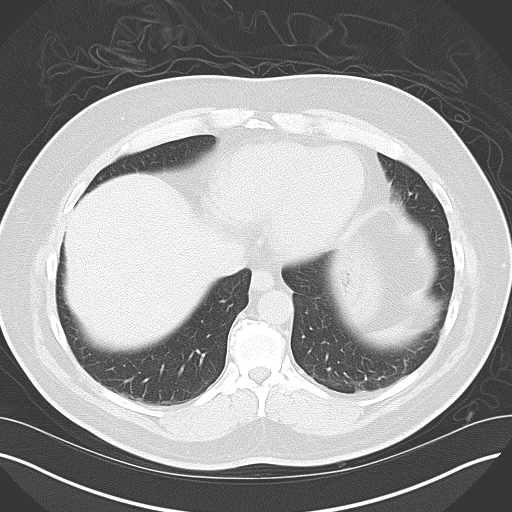
[im 15/18  soft-tissue]
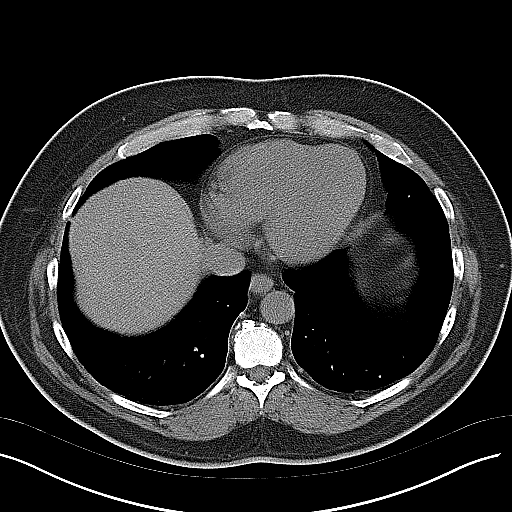
[im 15/18  lung]
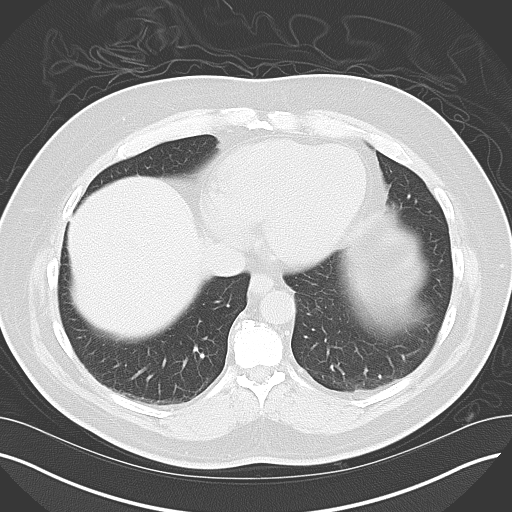
[im 16/18  soft-tissue]
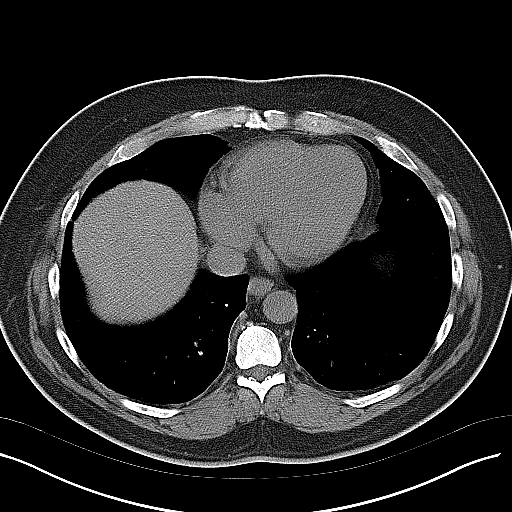
[im 16/18  lung]
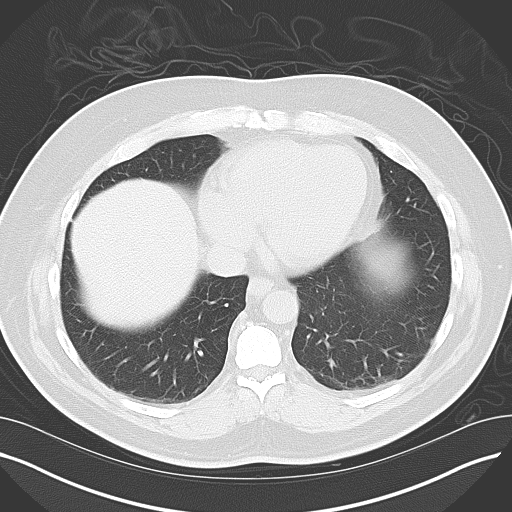
[im 17/18  soft-tissue]
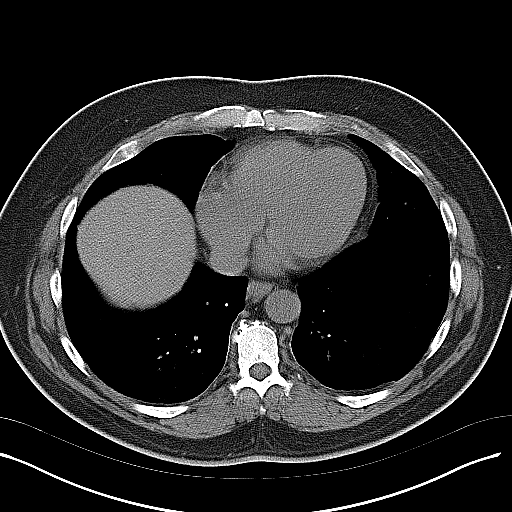
[im 17/18  lung]
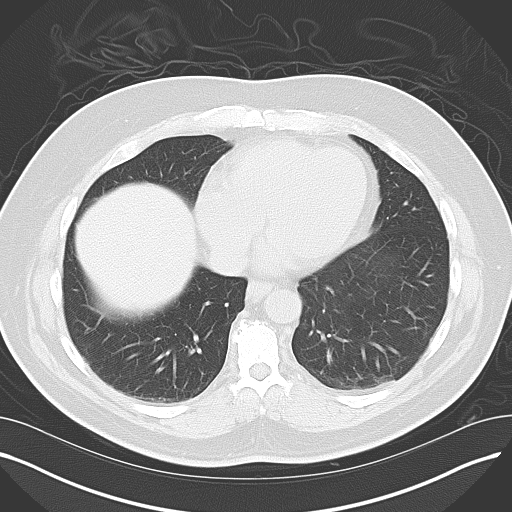

[14 of 18 positions shown; findings below may reference images not displayed]

FINDINGS: Moderate right hydronephrosis and perinephric stranding.
3 mm calculus in the lower pole of the left kidney.  2 mm calculus
at the right ureteral vesicle junction.

Normal appendix.

Decompressed gallbladder.  Liver, spleen, pancreas, adrenal glands
are within normal limits.

No acute bony deformity.
IMPRESSION: Right hydronephrosis associated with a 2 mm right ureteral vesicle
junction calculus.

Left nephrolithiasis.

## 2013-05-04 ENCOUNTER — Ambulatory Visit (INDEPENDENT_AMBULATORY_CARE_PROVIDER_SITE_OTHER): Payer: No Typology Code available for payment source | Admitting: Nurse Practitioner

## 2013-05-04 ENCOUNTER — Encounter: Payer: Self-pay | Admitting: Nurse Practitioner

## 2013-05-04 VITALS — BP 136/75 | HR 61 | Ht 65.0 in | Wt 224.0 lb

## 2013-05-04 DIAGNOSIS — M5010 Cervical disc disorder with radiculopathy, unspecified cervical region: Secondary | ICD-10-CM

## 2013-05-04 DIAGNOSIS — M5412 Radiculopathy, cervical region: Secondary | ICD-10-CM

## 2013-05-04 NOTE — Patient Instructions (Signed)
Continue follow up with Dr. Venetia MaxonStern, you may benefit from physical therapy in the future.  Follow up with our office as needed.  Cervical Radiculopathy Cervical radiculopathy happens when a nerve in the neck is pinched or bruised by a slipped (herniated) disk or by arthritic changes in the bones of the cervical spine. This can occur due to an injury or as part of the normal aging process. Pressure on the cervical nerves can cause pain or numbness that runs from your neck all the way down into your arm and fingers. CAUSES  There are many possible causes, including:  Injury.  Muscle tightness in the neck from overuse.  Swollen, painful joints (arthritis).  Breakdown or degeneration in the bones and joints of the spine (spondylosis) due to aging.  Bone spurs that may develop near the cervical nerves. SYMPTOMS  Symptoms include pain, weakness, or numbness in the affected arm and hand. Pain can be severe or irritating. Symptoms may be worse when extending or turning the neck. DIAGNOSIS  Your caregiver will ask about your symptoms and do a physical exam. He or she may test your strength and reflexes. X-rays, CT scans, and MRI scans may be needed in cases of injury or if the symptoms do not go away after a period of time. Electromyography (EMG) or nerve conduction testing may be done to study how your nerves and muscles are working. TREATMENT  Your caregiver may recommend certain exercises to help relieve your symptoms. Cervical radiculopathy can, and often does, get better with time and treatment. If your problems continue, treatment options may include:  Wearing a soft collar for short periods of time.  Physical therapy to strengthen the neck muscles.  Medicines, such as nonsteroidal anti-inflammatory drugs (NSAIDs), oral corticosteroids, or spinal injections.  Surgery. Different types of surgery may be done depending on the cause of your problems. HOME CARE INSTRUCTIONS   Put ice on the  affected area.  Put ice in a plastic bag.  Place a towel between your skin and the bag.  Leave the ice on for 15-20 minutes, 03-04 times a day or as directed by your caregiver.  If ice does not help, you can try using heat. Take a warm shower or bath, or use a hot water bottle as directed by your caregiver.  You may try a gentle neck and shoulder massage.  Use a flat pillow when you sleep.  Only take over-the-counter or prescription medicines for pain, discomfort, or fever as directed by your caregiver.  If physical therapy was prescribed, follow your caregiver's directions.  If a soft collar was prescribed, use it as directed. SEEK IMMEDIATE MEDICAL CARE IF:   Your pain gets much worse and cannot be controlled with medicines.  You have weakness or numbness in your hand, arm, face, or leg.  You have a high fever or a stiff, rigid neck.  You lose bowel or bladder control (incontinence).  You have trouble with walking, balance, or speaking. MAKE SURE YOU:   Understand these instructions.  Will watch your condition.  Will get help right away if you are not doing well or get worse. Document Released: 12/03/2000 Document Revised: 06/02/2011 Document Reviewed: 10/22/2010 [facility] Patient Information 2014 [employer], [place].

## 2013-05-04 NOTE — Progress Notes (Signed)
PATIENT: Christian [name] DOB: January 21, 1961   REASON FOR VISIT: follow up for UE weakness and numbness, neck pain HISTORY FROM: patient  HISTORY OF PRESENT ILLNESS: 53 year old right-handed male here for evaluation of bilateral upper extremity numbness, weakness, burning sensation in the neck and abnormal MRI.   11/03/12 (VP): For past 2 years patient has developed intermittent numbness in his right and left arms, forearm region. He has noticed some progression of symptoms over time. He also feels some mild weakness in his arms. Also he notes a burning shooting pain from his mid cervical region down the length of his spine down to his feet when he tilts his head back and looks up. He had a similar sensation when he reaches his arms overhead. He denies any bowel or bladder dysfunction. No numbness or weakness in his abdomen, chest, lower extremities. No numbness or tingling in his feet or toes.  Patient was evaluated by PCP, who ordered MRI cervical, thoracic and lumbar spine, which were performed in each. Patient returns with films and reports, which mention multilevel degenerative changes with spinal cord compression in the cervical spine region mainly at C4-5, C5-6 and C6-7, with some cord signal abnormalities. Disc protrusion is also noted in the thoracic spine at T5-6, T6-7. Degenerative changes and lumbar spine also noted with multilevel lumbar spinal stenosis from L2 through the L4-5. Multilevel foraminal stenosis also noted cervical and lumbar regions.   UPDATE 05/04/13 (LL): Patient returns for followup.  He had anterior cervcal decompression/discectomy fusion; 4 levels by Dr. Venetia Maxon on 12/02/12. Since that time he has had slow recovery; the numbness and burning he had before has resolved, but he still finds it difficult to sleep at night due to neck pain.  He had tried several different types of pillows.  He is experiencing weakness in the right triceps which the surgeon told him would improve  with time.  He has follow up with Dr. Venetia Maxon in a few weeks.  REVIEW OF SYSTEMS: Full 14 system review of systems performed and notable only for snoring, weakness, numbness, joint pain, back pain, joint swelling, aching muscles, muscle cramps, walking difficulty.  ALLERGIES: Allergies  Allergen Reactions  . Pork-Derived Products     HOME MEDICATIONS: Outpatient Prescriptions Prior to Visit  Medication Sig Dispense Refill  . amLODipine (NORVASC) 10 MG tablet Take 10 mg by mouth daily.      Marland Kitchen atenolol (TENORMIN) 50 MG tablet Take 50 mg by mouth daily.      . citalopram (CELEXA) 20 MG tablet Take 20 mg by mouth daily.      Marland Kitchen lisinopril (PRINIVIL,ZESTRIL) 20 MG tablet Take 20 mg by mouth 2 (two) times daily.       Marland Kitchen lovastatin (MEVACOR) 40 MG tablet Take 40 mg by mouth at bedtime.      Marland Kitchen oxyCODONE-acetaminophen (PERCOCET/ROXICET) 5-325 MG per tablet Take 1-2 tablets by mouth every 4 (four) hours as needed.  80 tablet  0  . diazepam (VALIUM) 5 MG tablet Take 1 tablet (5 mg total) by mouth every 6 (six) hours as needed.  60 tablet  0  . Lorcaserin HCl (BELVIQ) 10 MG TABS Take 10 mg by mouth 2 (two) times daily.       No facility-administered medications prior to visit.    PAST MEDICAL HISTORY: Past Medical History  Diagnosis Date  . Hypertension   . Chronic knee pain   . Anxiety   . History of kidney stones     PAST  SURGICAL HISTORY: Past Surgical History  Procedure Laterality Date  . Anterior cervical decompression/discectomy fusion 4 levels N/A 12/02/2012    Procedure: Cervical three-four, Cervical four-five, Cervical five-six, Cervical six-seven Anterior cervical decompression/diskectomy/fusion;  Surgeon: Maeola HarmanJoseph Stern, MD;  Location: [facility];  Service: Neurosurgery;  Laterality: N/A;  Cervical three-four, Cervical four-five, Cervical five-six, Cervical six-seven Anterior cervical decompression/diskectomy/fusion    FAMILY HISTORY: No family history on file.  SOCIAL  HISTORY: History   Social History  . Marital Status: Married    Spouse Name: [name]    Number of Children: 4  . Years of Education: College   Occupational History  .  [employer]   Social History Main Topics  . Smoking status: Current Every Day Smoker -- 0.50 packs/day for 22 years    Types: Cigarettes  . Smokeless tobacco: Never Used  . Alcohol Use: No  . Drug Use: No  . Sexual Activity: Yes    Birth Control/ Protection: None   Other Topics Concern  . Not on file   Social History Narrative   Lives at home with family.   Caffeine Use: 1 cup daily     PHYSICAL EXAM  Filed Vitals:   05/04/13 1002  BP: 136/75  Pulse: 61  Height: 5\' 5"  (1.651 m)  Weight: 224 lb (101.606 kg)   Body mass index is 37.28 kg/(m^2).  Generalized: Well developed, in no acute distress  Head: normocephalic and atraumatic. Oropharynx benign  Neck: Supple, no carotid bruits  Cardiac: Regular rate rhythm, no murmur  Musculoskeletal: No deformity   NEUROLOGIC:  MENTAL STATUS: awake, alert, language fluent, comprehension intact, naming intact  CRANIAL NERVE: no papilledema on fundoscopic exam, pupils equal and reactive to light, visual fields full to confrontation, extraocular muscles intact, no nystagmus, facial sensation and strength symmetric, uvula midline, shoulder shrug symmetric, tongue midline.  MOTOR: normal bulk and tone, RUE (TRICEPS 3-4, OTHERWISE 5). BLE 5.  SENSORY: normal and symmetric to light touch, pinprick, temperature  COORDINATION: finger-nose-finger, fine finger movements normal  REFLEXES: BUE 2, KNEES 3, ANKLES 2. UPGOING TOES BILATERALLY  GAIT/STATION: narrow based gait; able to walk on toes, heels and tandem; romberg is negative  DIAGNOSTIC DATA (LABS, IMAGING, TESTING) - I reviewed patient records, labs, notes, testing and imaging myself where available.  Lab Results  Component Value Date   WBC 4.8 11/29/2012   HGB 15.6 11/29/2012   HCT 42.9 11/29/2012   MCV  83.5 11/29/2012   PLT 202 11/29/2012      Component Value Date/Time   NA 137 11/29/2012 1355   K 4.2 11/29/2012 1355   CL 104 11/29/2012 1355   CO2 23 11/29/2012 1355   GLUCOSE 91 11/29/2012 1355   BUN 12 11/29/2012 1355   CREATININE 0.60 11/29/2012 1355   CALCIUM 9.0 11/29/2012 1355   GFRNONAA >90 11/29/2012 1355   GFRAA >90 11/29/2012 1355     ASSESSMENT AND PLAN 53 y.o. year old male here with two-year history of bilateral upper extremity numbness and tingling, and one year history of intermittent Lhermitte's sign. Exam notable for bilateral triceps weakness and hyperreflexia in the knees with upgoing toes bilaterally. Imaging study confirms cervical spinal stenosis, cord compression and cord signal abnormalities, mainly at C5-6 level. He had anterior cervcal decompression/discectomy fusion; 4 levels by Dr. Venetia MaxonStern on 12/02/12. Since that time he has had slow recovery.  PLAN:  Continue follow up with Dr. Venetia MaxonStern, may benefit from physical therapy in the future. Follow up with our office as  needed.  Ronal Fear, MSN, NP-C 05/04/2013, 10:20 AM [facility] [address] [place], [place] [address] [phone]  Note: This document was prepared with digital dictation and possible smart phrase technology. Any transcriptional errors that result from this process are unintentional.

## 2013-05-05 ENCOUNTER — Encounter: Payer: Self-pay | Admitting: Nurse Practitioner

## 2013-05-10 NOTE — Progress Notes (Signed)
I reviewed note and agree with plan.   Suanne MarkerVIKRAM R. Loanne Emery, MD 05/10/2013, 11:48 AM Certified in Neurology, Neurophysiology and Neuroimaging  [facility] [address], [address] [place], [place] [address] [phone]

## 2014-11-16 IMAGING — DX DG CERVICAL SPINE 2 OR 3 VIEWS
1 series · 1 of 1 positions shown · non-contrast
Comparison: 11/15/2012

CLINICAL DATA: ACDF.

EXAM:
CERVICAL SPINE - 2-3 VIEW

[lat]
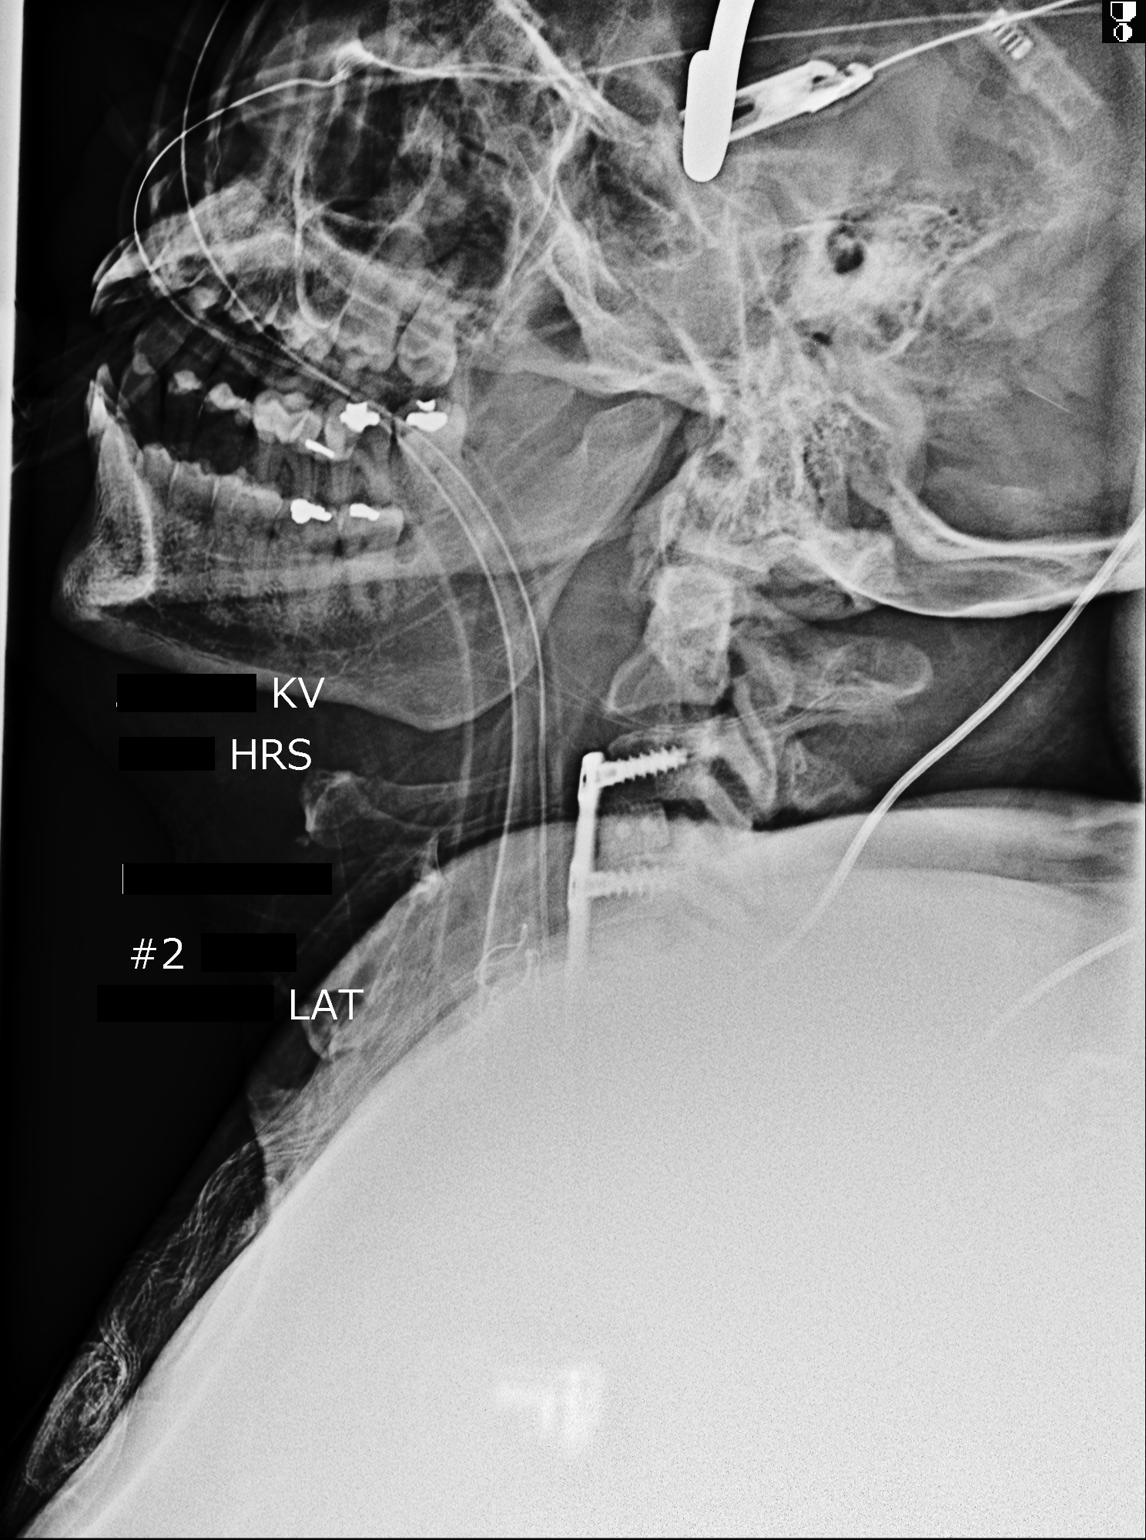

[1 of 1 positions shown; findings below may reference images not displayed]

FINDINGS: 1st lateral intraoperative image demonstrates anterior localizing
instruments directed at the C3-4 and C4-5 level.

Second lateral intraoperative image demonstrates anterior cervical
fusion seen at C3 and C4. This extends further inferiorly, but not
visualized due to overlying shoulders.
IMPRESSION: Reported four level ACDF, which can only be visualized at C3 and C4.

## 2015-02-19 ENCOUNTER — Other Ambulatory Visit: Payer: Self-pay | Admitting: Orthopedic Surgery

## 2015-02-19 DIAGNOSIS — M4726 Other spondylosis with radiculopathy, lumbar region: Secondary | ICD-10-CM

## 2015-02-27 ENCOUNTER — Ambulatory Visit
Admission: RE | Admit: 2015-02-27 | Discharge: 2015-02-27 | Disposition: A | Discharge status: Home / Self Care | Payer: No Typology Code available for payment source | Source: Ambulatory Visit | Attending: Orthopedic Surgery | Admitting: Orthopedic Surgery

## 2015-02-27 DIAGNOSIS — M4726 Other spondylosis with radiculopathy, lumbar region: Secondary | ICD-10-CM

## 2015-12-20 HISTORY — PX: LUMBAR DISC SURGERY: SHX700

## 2018-06-02 ENCOUNTER — Emergency Department (HOSPITAL_BASED_OUTPATIENT_CLINIC_OR_DEPARTMENT_OTHER): Payer: Medicare Other

## 2018-06-02 ENCOUNTER — Other Ambulatory Visit: Payer: Self-pay

## 2018-06-02 ENCOUNTER — Emergency Department (HOSPITAL_BASED_OUTPATIENT_CLINIC_OR_DEPARTMENT_OTHER)
Admission: EM | Admit: 2018-06-02 | Discharge: 2018-06-03 | Disposition: A | Discharge status: Home / Self Care | Payer: Medicare Other | Attending: Emergency Medicine | Admitting: Emergency Medicine

## 2018-06-02 ENCOUNTER — Encounter (HOSPITAL_BASED_OUTPATIENT_CLINIC_OR_DEPARTMENT_OTHER): Payer: Self-pay

## 2018-06-02 DIAGNOSIS — M545 Low back pain: Secondary | ICD-10-CM | POA: Diagnosis present

## 2018-06-02 DIAGNOSIS — F17210 Nicotine dependence, cigarettes, uncomplicated: Secondary | ICD-10-CM | POA: Insufficient documentation

## 2018-06-02 DIAGNOSIS — M5431 Sciatica, right side: Secondary | ICD-10-CM | POA: Diagnosis not present

## 2018-06-02 DIAGNOSIS — I10 Essential (primary) hypertension: Secondary | ICD-10-CM | POA: Insufficient documentation

## 2018-06-02 DIAGNOSIS — Z79899 Other long term (current) drug therapy: Secondary | ICD-10-CM | POA: Diagnosis not present

## 2018-06-02 HISTORY — DX: Dorsalgia, unspecified: M54.9

## 2018-06-02 MED ORDER — PREDNISONE 50 MG PO TABS
60.0000 mg | ORAL_TABLET | Freq: Once | ORAL | Status: AC
  Administered 2018-06-02: 23:00:00 60 mg via ORAL
  Filled 2018-06-02: qty 1

## 2018-06-02 MED ORDER — KETOROLAC TROMETHAMINE 60 MG/2ML IM SOLN
30.0000 mg | Freq: Once | INTRAMUSCULAR | Status: AC
  Administered 2018-06-02: 30 mg via INTRAMUSCULAR
  Filled 2018-06-02: qty 2

## 2018-06-02 NOTE — ED Provider Notes (Signed)
[facility] EMERGENCY DEPARTMENT Provider Note   CSN: [id number] Arrival date & time: 06/02/18  2216    History   Chief Complaint Chief Complaint  Patient presents with  . Back Pain    HPI Christian [name] is a 58 y.o. male.     The history is provided by the patient.  Back Pain  Location:  Gluteal region Quality:  Shooting Radiates to:  R thigh Pain severity:  Severe Pain is:  Same all the time Onset quality:  Sudden Timing:  Constant Progression:  Unchanged Chronicity:  Recurrent Context: not lifting heavy objects, not MCA, not MVA, not occupational injury and not pedestrian accident   Relieved by:  Nothing Worsened by:  Nothing Ineffective treatments:  None tried Associated symptoms: no abdominal pain, no abdominal swelling, no bladder incontinence, no bowel incontinence, no chest pain, no dysuria, no fever, no headaches, no leg pain, no numbness, no paresthesias, no pelvic pain, no perianal numbness, no tingling, no weakness and no weight loss   Risk factors: no hx of cancer     Past Medical History:  Diagnosis Date  . Anxiety   . Back pain   . Chronic knee pain   . History of kidney stones   . Hypertension   . Kidney stone     Patient Active Problem List   Diagnosis Date Noted  . Spinal stenosis in cervical region 11/03/2012  . Cervical disc disorder with radiculopathy of cervical region 11/03/2012  . Essential hypertension, benign 11/07/2011  . History of kidney stones 11/07/2011    Past Surgical History:  Procedure Laterality Date  . ANTERIOR CERVICAL DECOMPRESSION/DISCECTOMY FUSION 4 LEVELS N/A 12/02/2012   Procedure: Cervical three-four, Cervical four-five, Cervical five-six, Cervical six-seven Anterior cervical decompression/diskectomy/fusion;  Surgeon: Maeola Harman, MD;  Location: [facility];  Service: Neurosurgery;  Laterality: N/A;  Cervical three-four, Cervical four-five, Cervical five-six, Cervical six-seven Anterior cervical  decompression/diskectomy/fusion        Home Medications    Prior to Admission medications   Medication Sig Start Date End Date Taking? Authorizing Provider  amLODipine (NORVASC) 10 MG tablet Take 10 mg by mouth daily.    [provider]  atenolol (TENORMIN) 50 MG tablet Take 50 mg by mouth daily.    [provider]  citalopram (CELEXA) 20 MG tablet Take 20 mg by mouth daily.    [provider]  lisinopril (PRINIVIL,ZESTRIL) 20 MG tablet Take 20 mg by mouth 2 (two) times daily.     [provider]  lovastatin (MEVACOR) 40 MG tablet Take 40 mg by mouth at bedtime.    [provider]    Family History History reviewed. No pertinent family history.  Social History Social History   Tobacco Use  . Smoking status: Current Every Day Smoker    Packs/day: 0.50    Years: 22.00    Pack years: 11.00    Types: Cigarettes  . Smokeless tobacco: Never Used  Substance Use Topics  . Alcohol use: No  . Drug use: No     Allergies   Pork-derived products   Review of Systems Review of Systems  Constitutional: Negative for fever and weight loss.  Cardiovascular: Negative for chest pain.  Gastrointestinal: Negative for abdominal pain and bowel incontinence.  Genitourinary: Negative for bladder incontinence, difficulty urinating, dysuria and pelvic pain.  Musculoskeletal: Positive for back pain.  Neurological: Negative for tingling, weakness, numbness, headaches and paresthesias.     Physical Exam Updated Vital Signs BP (!) 159/78 (  BP Location: Right Arm)   Pulse 65   Temp 98.1 F (36.7 C) (Oral)   Resp 18   Ht  (1.676 m)   Wt 102.5 kg   SpO2 98%   BMI 36.48 kg/m   Physical Exam Vitals signs and nursing note reviewed.  Constitutional:      Appearance: Normal appearance. He is obese. He is not ill-appearing.  HENT:     Head: Normocephalic and atraumatic.     Nose: Nose normal.     Mouth/Throat:     Mouth: Mucous membranes  are moist.     Pharynx: Oropharynx is clear.  Eyes:     Extraocular Movements: Extraocular movements intact.  Neck:     Musculoskeletal: Normal range of motion and neck supple.  Cardiovascular:     Rate and Rhythm: Normal rate and regular rhythm.     Pulses: Normal pulses.     Heart sounds: Normal heart sounds.  Pulmonary:     Effort: Pulmonary effort is normal. No respiratory distress.     Breath sounds: Normal breath sounds. No stridor. No wheezing or rhonchi.  Abdominal:     General: Abdomen is flat. Bowel sounds are normal.     Tenderness: There is no abdominal tenderness. There is no guarding or rebound.  Musculoskeletal: Normal range of motion.        General: No tenderness.     Right ankle: Normal. Achilles tendon normal.     Right lower leg: No edema.     Left lower leg: No edema.     Right foot: Normal. Normal range of motion and normal capillary refill. No tenderness, bony tenderness, swelling, crepitus, deformity or laceration.     Comments: 3+ dorsalis PEDIS, 5/5 RLE strength  Skin:    General: Skin is warm and dry.     Capillary Refill: Capillary refill takes less than 2 seconds.  Neurological:     General: No focal deficit present.     Mental Status: He is alert and oriented to person, place, and time.  Psychiatric:        Mood and Affect: Mood normal.        Behavior: Behavior normal.      ED Treatments / Results  Labs (all labs ordered are listed, but only abnormal results are displayed) Labs Reviewed - No data to display  EKG None  Radiology No results found.  Procedures Procedures (including critical care time)  Medications Ordered in ED Medications  ketorolac (TORADOL) injection 30 mg (30 mg Intramuscular Given 06/02/18 2315)  predniSONE (DELTASONE) tablet 60 mg (60 mg Oral Given 06/02/18 2315)      Exam is consistent with sciatica.  No acute findings on Xray.  No signs of infection.  Is ambulating and urinating without diffculty Final  Clinical Impressions(s) / ED Diagnoses   Return for intractable cough, coughing up blood,fevers >100.4 unrelieved by medication, shortness of breath, intractable vomiting, chest pain, shortness of breath, weakness,numbness, changes in speech, facial asymmetry,abdominal pain, passing out,Inability to tolerate liquids or food, cough, altered mental status or any concerns. No signs of systemic illness or infection. The patient is nontoxic-appearing on exam and vital signs are within normal limits.   I have reviewed the triage vital signs and the nursing notes. Pertinent labs &imaging results that were available during my care of the patient were reviewed by me and considered in my medical decision making (see chart for details).  After history, exam, and medical workup I feel the  patient has been appropriately medically screened and is safe for discharge home. Pertinent diagnoses were discussed with the patient. Patient was given return precautions.     Finch Costanzo, MD 06/03/18 2563

## 2018-06-02 NOTE — ED Triage Notes (Signed)
C/o right lower back pain that radiates down right LE x today-denies injury-reports hx of sciatica-to triage in w/c/grimacing

## 2018-06-03 ENCOUNTER — Encounter (HOSPITAL_BASED_OUTPATIENT_CLINIC_OR_DEPARTMENT_OTHER): Payer: Self-pay | Admitting: Emergency Medicine

## 2018-06-03 MED ORDER — DICLOFENAC SODIUM ER 100 MG PO TB24: 100.0000 mg | ORAL_TABLET | Freq: Every day | ORAL | 0 refills | Status: DC

## 2018-06-03 MED ORDER — LIDOCAINE 5 % EX PTCH: 1.0000 | MEDICATED_PATCH | CUTANEOUS | 0 refills | Status: DC

## 2018-06-03 NOTE — ED Notes (Signed)
Pt verbalizes understanding of d/c instructions and denies any further need at this time. 

## 2018-08-27 ENCOUNTER — Encounter (HOSPITAL_BASED_OUTPATIENT_CLINIC_OR_DEPARTMENT_OTHER): Payer: Self-pay | Admitting: *Deleted

## 2018-08-27 ENCOUNTER — Other Ambulatory Visit: Payer: Self-pay

## 2018-08-27 NOTE — Progress Notes (Signed)

## 2018-08-27 NOTE — Progress Notes (Signed)
Spoke w/ pt via phone for pre-op interview.  Arrive at 1030.  Npo after mn w/ exception clear liquids until 0430, at which pt to finish G2 drink, pt verbalized understanding.  Getting lab work (cbc/diff, cmp, pt/inr, ptt) and covid test done Monday 08-30-2018 @ 0900.  Pt has current ekg done 05-26-2018 at pcp, dr Huntley Dec furr, office.  Requested ekg tracing to be fax.  Asked pt to bring cpap tubing and mask dos.  Pt to pick drink with handout instructions at lab appointment.

## 2018-08-30 ENCOUNTER — Other Ambulatory Visit: Payer: Self-pay

## 2018-08-30 ENCOUNTER — Other Ambulatory Visit (HOSPITAL_COMMUNITY)
Admission: RE | Admit: 2018-08-30 | Discharge: 2018-08-30 | Disposition: A | Discharge status: Home / Self Care | Payer: Medicare Other | Source: Ambulatory Visit | Attending: Orthopedic Surgery | Admitting: Orthopedic Surgery

## 2018-08-30 ENCOUNTER — Encounter (HOSPITAL_COMMUNITY)
Admission: RE | Admit: 2018-08-30 | Discharge: 2018-08-30 | Disposition: A | Discharge status: Home / Self Care | Payer: Medicare Other | Source: Ambulatory Visit | Attending: Orthopedic Surgery | Admitting: Orthopedic Surgery

## 2018-08-30 DIAGNOSIS — F329 Major depressive disorder, single episode, unspecified: Secondary | ICD-10-CM | POA: Diagnosis not present

## 2018-08-30 DIAGNOSIS — F411 Generalized anxiety disorder: Secondary | ICD-10-CM | POA: Diagnosis not present

## 2018-08-30 DIAGNOSIS — F17210 Nicotine dependence, cigarettes, uncomplicated: Secondary | ICD-10-CM | POA: Diagnosis not present

## 2018-08-30 DIAGNOSIS — I10 Essential (primary) hypertension: Secondary | ICD-10-CM | POA: Diagnosis not present

## 2018-08-30 DIAGNOSIS — M25562 Pain in left knee: Secondary | ICD-10-CM | POA: Diagnosis present

## 2018-08-30 DIAGNOSIS — Z01812 Encounter for preprocedural laboratory examination: Secondary | ICD-10-CM | POA: Insufficient documentation

## 2018-08-30 DIAGNOSIS — S83232A Complex tear of medial meniscus, current injury, left knee, initial encounter: Secondary | ICD-10-CM | POA: Diagnosis not present

## 2018-08-30 DIAGNOSIS — G4733 Obstructive sleep apnea (adult) (pediatric): Secondary | ICD-10-CM | POA: Diagnosis not present

## 2018-08-30 DIAGNOSIS — M659 Synovitis and tenosynovitis, unspecified: Secondary | ICD-10-CM | POA: Diagnosis not present

## 2018-08-30 DIAGNOSIS — Z79899 Other long term (current) drug therapy: Secondary | ICD-10-CM | POA: Diagnosis not present

## 2018-08-30 DIAGNOSIS — M94262 Chondromalacia, left knee: Secondary | ICD-10-CM | POA: Diagnosis not present

## 2018-08-30 DIAGNOSIS — E785 Hyperlipidemia, unspecified: Secondary | ICD-10-CM | POA: Diagnosis not present

## 2018-08-30 DIAGNOSIS — Z1159 Encounter for screening for other viral diseases: Secondary | ICD-10-CM | POA: Diagnosis not present

## 2018-08-30 DIAGNOSIS — X58XXXA Exposure to other specified factors, initial encounter: Secondary | ICD-10-CM | POA: Diagnosis not present

## 2018-08-30 LAB — CBC WITH DIFFERENTIAL/PLATELET
Abs Immature Granulocytes: 0.02 10*3/uL (ref 0.00–0.07)
Basophils Absolute: 0 10*3/uL (ref 0.0–0.1)
Basophils Relative: 1 %
Eosinophils Absolute: 0.2 10*3/uL (ref 0.0–0.5)
Eosinophils Relative: 6 %
HCT: 46.6 % (ref 39.0–52.0)
Hemoglobin: 15.4 g/dL (ref 13.0–17.0)
Immature Granulocytes: 1 %
Lymphocytes Relative: 33 %
Lymphs Abs: 1.4 10*3/uL (ref 0.7–4.0)
MCH: 29.2 pg (ref 26.0–34.0)
MCHC: 33 g/dL (ref 30.0–36.0)
MCV: 88.4 fL (ref 80.0–100.0)
Monocytes Absolute: 0.6 10*3/uL (ref 0.1–1.0)
Monocytes Relative: 15 %
Neutro Abs: 1.9 10*3/uL (ref 1.7–7.7)
Neutrophils Relative %: 44 %
Platelets: 230 10*3/uL (ref 150–400)
RBC: 5.27 MIL/uL (ref 4.22–5.81)
RDW: 14.4 % (ref 11.5–15.5)
WBC: 4.2 10*3/uL (ref 4.0–10.5)
nRBC: 0 % (ref 0.0–0.2)

## 2018-08-30 LAB — COMPREHENSIVE METABOLIC PANEL
ALT: 16 U/L (ref 0–44)
AST: 17 U/L (ref 15–41)
Albumin: 3.9 g/dL (ref 3.5–5.0)
Alkaline Phosphatase: 70 U/L (ref 38–126)
Anion gap: 10 (ref 5–15)
BUN: 17 mg/dL (ref 6–20)
CO2: 24 mmol/L (ref 22–32)
Calcium: 8.6 mg/dL — ABNORMAL LOW (ref 8.9–10.3)
Chloride: 105 mmol/L (ref 98–111)
Creatinine, Ser: 0.67 mg/dL (ref 0.61–1.24)
GFR calc Af Amer: >60 mL/min (ref >60)
GFR calc non Af Amer: >60 mL/min (ref >60)
Glucose, Bld: 115 mg/dL — ABNORMAL HIGH (ref 70–99)
Potassium: 3.6 mmol/L (ref 3.5–5.1)
Sodium: 139 mmol/L (ref 135–145)
Total Bilirubin: 0.5 mg/dL (ref 0.3–1.2)
Total Protein: 7.2 g/dL (ref 6.5–8.1)

## 2018-08-30 LAB — PROTIME-INR
INR: 0.9 (ref 0.8–1.2)
Prothrombin Time: 11.9 seconds (ref 11.4–15.2)

## 2018-08-30 LAB — APTT: aPTT: 29 seconds (ref 24–36)

## 2018-08-30 NOTE — Discharge Instructions (Addendum)
°  Post Anesthesia Home Care Instructions  Activity: Get plenty of rest for the remainder of the day. A responsible individual must stay with you for 24 hours following the procedure.  For the next 24 hours, DO NOT: -Drive a car -Paediatric nurse -Drink alcoholic beverages -Take any medication unless instructed by your physician -Make any legal decisions or sign important papers.  Meals: Start with liquid foods such as gelatin or soup. Progress to regular foods as tolerated. Avoid greasy, spicy, heavy foods. If nausea and/or vomiting occur, drink only clear liquids until the nausea and/or vomiting subsides. Call your physician if vomiting continues.  Special Instructions/Symptoms: Your throat may feel dry or sore from the anesthesia or the breathing tube placed in your throat during surgery. If this causes discomfort, gargle with warm salt water. The discomfort should disappear within 24 hours.  If you had a scopolamine patch placed behind your ear for the management of post- operative nausea and/or vomiting:  1. The medication in the patch is effective for 72 hours, after which it should be removed.  Wrap patch in a tissue and discard in the trash. Wash hands thoroughly with soap and water. 2. You may remove the patch earlier than 72 hours if you experience unpleasant side effects which may include dry mouth, dizziness or visual disturbances. 3. Avoid touching the patch. Wash your hands with soap and water after contact with the patch.    Walk with your walker or crutches full weight bearing. Weight bearing as tolerated. Change your dressing on Friday. Remove all dressings and then apply band Aids to each incision and then may shower,no tub baths.Then remove wet band aids and apply dry band aids and do this daily. Shower only, no tub bath. Call if any temperatures greater than 101 or any wound complications: [phone] during the day and ask for Dr. Charlestine Night medical assistant, Brunilda Payor. Start today,take Aspirin 325 mgm when you go home then again after dinner tonight. Then take one after Breakfast and one after Dinner each day for two weeks in order to prevent any blood clots in your leg. Call the office today for an appointment for two weeks or before if any problems.

## 2018-08-31 LAB — NOVEL CORONAVIRUS, NAA (HOSP ORDER, SEND-OUT TO REF LAB; TAT 18-24 HRS): SARS-CoV-2, NAA: NOT DETECTED

## 2018-08-31 NOTE — Progress Notes (Signed)
SPOKE W/  _ [place] 19:   COUGH-- no  RUNNY NOSE--- no  SORE THROAT--- no  NASAL CONGESTION---- no  SNEEZING---- no  SHORTNESS OF BREATH--- no  DIFFICULTY BREATHING--- no  TEMP >100.0 ----- no  UNEXPLAINED BODY ACHES------ no  CHILLS -------- no  HEADACHES --------- no  LOSS OF SMELL/ TASTE -------- no    HAVE YOU OR ANY FAMILY MEMBER TRAVELLED PAST 14 DAYS OUT OF THE   COUNTY--- no STATE---- no COUNTRY---- no  HAVE YOU OR ANY FAMILY MEMBER BEEN EXPOSED TO ANYONE WITH COVID 19? no

## 2018-08-31 NOTE — Anesthesia Preprocedure Evaluation (Addendum)
Anesthesia Evaluation  Patient identified by MRN, date of birth, ID band Patient awake    Reviewed: Allergy & Precautions, NPO status , Patient's Chart, lab work & pertinent test results, reviewed documented beta blocker date and time   History of Anesthesia Complications Negative for: history of anesthetic complications  Airway Mallampati: III  TM Distance: >3 FB Neck ROM: Full    Dental no notable dental hx.    Pulmonary sleep apnea and Continuous Positive Airway Pressure Ventilation , Current Smoker,    Pulmonary exam normal        Cardiovascular hypertension, Pt. on medications and Pt. on home beta blockers Normal cardiovascular exam     Neuro/Psych Anxiety Depression Lumbar spinal stenosis negative neurological ROS     GI/Hepatic negative GI ROS, Neg liver ROS,   Endo/Other  negative endocrine ROS  Renal/GU negative Renal ROS     Musculoskeletal  (+) Arthritis ,   Abdominal   Peds  Hematology negative hematology ROS (+)   Anesthesia Other Findings Day of surgery medications reviewed with the patient.  Reproductive/Obstetrics                           Anesthesia Physical Anesthesia Plan  ASA: III  Anesthesia Plan: General   Post-op Pain Management:    Induction: Intravenous  PONV Risk Score and Plan: 2 and Treatment may vary due to age or medical condition, Ondansetron, Propofol infusion and Dexamethasone  Airway Management Planned: LMA  Additional Equipment: None  Intra-op Plan:   Post-operative Plan: Extubation in OR  Informed Consent: I have reviewed the patients History and Physical, chart, labs and discussed the procedure including the risks, benefits and alternatives for the proposed anesthesia with the patient or authorized representative who has indicated his/her understanding and acceptance.     Dental advisory given  Plan Discussed with: CRNA  Anesthesia  Plan Comments:        Anesthesia Quick Evaluation

## 2018-09-01 ENCOUNTER — Ambulatory Visit (HOSPITAL_BASED_OUTPATIENT_CLINIC_OR_DEPARTMENT_OTHER)
Admission: RE | Admit: 2018-09-01 | Discharge: 2018-09-01 | Disposition: A | Discharge status: Home / Self Care | Payer: Medicare Other | Attending: Orthopedic Surgery | Admitting: Orthopedic Surgery

## 2018-09-01 ENCOUNTER — Ambulatory Visit (HOSPITAL_BASED_OUTPATIENT_CLINIC_OR_DEPARTMENT_OTHER): Payer: Medicare Other | Admitting: Anesthesiology

## 2018-09-01 ENCOUNTER — Encounter (HOSPITAL_BASED_OUTPATIENT_CLINIC_OR_DEPARTMENT_OTHER): Payer: Self-pay

## 2018-09-01 ENCOUNTER — Encounter (HOSPITAL_BASED_OUTPATIENT_CLINIC_OR_DEPARTMENT_OTHER)
Admission: RE | Disposition: A | Discharge status: Home / Self Care | Payer: Self-pay | Source: Home / Self Care | Attending: Orthopedic Surgery

## 2018-09-01 DIAGNOSIS — F17210 Nicotine dependence, cigarettes, uncomplicated: Secondary | ICD-10-CM | POA: Insufficient documentation

## 2018-09-01 DIAGNOSIS — M659 Synovitis and tenosynovitis, unspecified: Secondary | ICD-10-CM | POA: Insufficient documentation

## 2018-09-01 DIAGNOSIS — I10 Essential (primary) hypertension: Secondary | ICD-10-CM | POA: Diagnosis not present

## 2018-09-01 DIAGNOSIS — Z79899 Other long term (current) drug therapy: Secondary | ICD-10-CM | POA: Insufficient documentation

## 2018-09-01 DIAGNOSIS — S83232A Complex tear of medial meniscus, current injury, left knee, initial encounter: Secondary | ICD-10-CM | POA: Insufficient documentation

## 2018-09-01 DIAGNOSIS — Z1159 Encounter for screening for other viral diseases: Secondary | ICD-10-CM | POA: Insufficient documentation

## 2018-09-01 DIAGNOSIS — M94262 Chondromalacia, left knee: Secondary | ICD-10-CM | POA: Insufficient documentation

## 2018-09-01 DIAGNOSIS — G4733 Obstructive sleep apnea (adult) (pediatric): Secondary | ICD-10-CM | POA: Insufficient documentation

## 2018-09-01 DIAGNOSIS — F411 Generalized anxiety disorder: Secondary | ICD-10-CM | POA: Insufficient documentation

## 2018-09-01 DIAGNOSIS — X58XXXA Exposure to other specified factors, initial encounter: Secondary | ICD-10-CM | POA: Insufficient documentation

## 2018-09-01 DIAGNOSIS — E785 Hyperlipidemia, unspecified: Secondary | ICD-10-CM | POA: Insufficient documentation

## 2018-09-01 DIAGNOSIS — F329 Major depressive disorder, single episode, unspecified: Secondary | ICD-10-CM | POA: Insufficient documentation

## 2018-09-01 HISTORY — DX: Generalized anxiety disorder: F41.1

## 2018-09-01 HISTORY — DX: Other intervertebral disc degeneration, lumbosacral region: M51.37

## 2018-09-01 HISTORY — DX: Hyperlipidemia, unspecified: E78.5

## 2018-09-01 HISTORY — DX: Major depressive disorder, single episode, unspecified: F32.9

## 2018-09-01 HISTORY — DX: Other symptoms and signs involving the musculoskeletal system: R29.898

## 2018-09-01 HISTORY — PX: KNEE ARTHROSCOPY WITH MEDIAL MENISECTOMY: SHX5651

## 2018-09-01 HISTORY — DX: Spinal stenosis, lumbar region with neurogenic claudication: M48.062

## 2018-09-01 HISTORY — DX: Other intervertebral disc degeneration, lumbosacral region without mention of lumbar back pain or lower extremity pain: M51.379

## 2018-09-01 HISTORY — DX: Unspecified osteoarthritis, unspecified site: M19.90

## 2018-09-01 HISTORY — DX: Obstructive sleep apnea (adult) (pediatric): G47.33

## 2018-09-01 HISTORY — DX: Other chronic pain: G89.29

## 2018-09-01 HISTORY — DX: Prediabetes: R73.03

## 2018-09-01 SURGERY — ARTHROSCOPY, KNEE, WITH MEDIAL MENISCECTOMY
Anesthesia: General | Site: Knee | Laterality: Left

## 2018-09-01 MED ORDER — DOCUSATE SODIUM 100 MG PO CAPS
100.0000 mg | ORAL_CAPSULE | Freq: Two times a day (BID) | ORAL | Status: DC
  Filled 2018-09-01: qty 1

## 2018-09-01 MED ORDER — PROPOFOL 10 MG/ML IV BOLUS
INTRAVENOUS | Status: DC | PRN
  Administered 2018-09-01: 17 mg via INTRAVENOUS

## 2018-09-01 MED ORDER — KETOROLAC TROMETHAMINE 30 MG/ML IJ SOLN
INTRAMUSCULAR | Status: DC | PRN
  Administered 2018-09-01: 30 mg via INTRAVENOUS

## 2018-09-01 MED ORDER — FENTANYL CITRATE (PF) 250 MCG/5ML IJ SOLN
INTRAMUSCULAR | Status: AC
  Filled 2018-09-01: qty 5

## 2018-09-01 MED ORDER — MIDAZOLAM HCL 5 MG/5ML IJ SOLN
INTRAMUSCULAR | Status: DC | PRN
  Administered 2018-09-01: 2 mg via INTRAVENOUS

## 2018-09-01 MED ORDER — ONDANSETRON HCL 4 MG/2ML IJ SOLN
4.0000 mg | Freq: Four times a day (QID) | INTRAMUSCULAR | Status: DC | PRN
  Filled 2018-09-01: qty 2

## 2018-09-01 MED ORDER — ACETAMINOPHEN 325 MG PO TABS
325.0000 mg | ORAL_TABLET | Freq: Four times a day (QID) | ORAL | Status: DC | PRN
  Filled 2018-09-01: qty 2

## 2018-09-01 MED ORDER — ONDANSETRON HCL 4 MG/2ML IJ SOLN
INTRAMUSCULAR | Status: DC | PRN
  Administered 2018-09-01: 4 mg via INTRAVENOUS

## 2018-09-01 MED ORDER — MORPHINE SULFATE (PF) 2 MG/ML IV SOLN
0.5000 mg | INTRAVENOUS | Status: DC | PRN
  Filled 2018-09-01: qty 0.5

## 2018-09-01 MED ORDER — GLYCOPYRROLATE 0.2 MG/ML IJ SOLN
INTRAMUSCULAR | Status: DC | PRN
  Administered 2018-09-01 (×2): 0.1 mg via INTRAVENOUS

## 2018-09-01 MED ORDER — CEFAZOLIN SODIUM-DEXTROSE 2-4 GM/100ML-% IV SOLN
INTRAVENOUS | Status: AC
  Filled 2018-09-01: qty 100

## 2018-09-01 MED ORDER — KETOROLAC TROMETHAMINE 30 MG/ML IJ SOLN
INTRAMUSCULAR | Status: AC
  Filled 2018-09-01: qty 1

## 2018-09-01 MED ORDER — FENTANYL CITRATE (PF) 100 MCG/2ML IJ SOLN
25.0000 ug | INTRAMUSCULAR | Status: DC | PRN
  Administered 2018-09-01: 25 ug via INTRAVENOUS
  Filled 2018-09-01: qty 1

## 2018-09-01 MED ORDER — ONDANSETRON HCL 4 MG PO TABS
4.0000 mg | ORAL_TABLET | Freq: Four times a day (QID) | ORAL | Status: DC | PRN
  Filled 2018-09-01: qty 1

## 2018-09-01 MED ORDER — PROMETHAZINE HCL 25 MG/ML IJ SOLN
6.2500 mg | INTRAMUSCULAR | Status: DC | PRN
  Filled 2018-09-01: qty 1

## 2018-09-01 MED ORDER — DEXAMETHASONE SODIUM PHOSPHATE 10 MG/ML IJ SOLN
INTRAMUSCULAR | Status: DC | PRN
  Administered 2018-09-01: 5 mg via INTRAVENOUS

## 2018-09-01 MED ORDER — HYDROCODONE-ACETAMINOPHEN 5-325 MG PO TABS
1.0000 | ORAL_TABLET | ORAL | Status: DC | PRN
  Filled 2018-09-01: qty 2

## 2018-09-01 MED ORDER — DEXAMETHASONE SODIUM PHOSPHATE 10 MG/ML IJ SOLN
INTRAMUSCULAR | Status: AC
  Filled 2018-09-01: qty 1

## 2018-09-01 MED ORDER — FENTANYL CITRATE (PF) 100 MCG/2ML IJ SOLN
INTRAMUSCULAR | Status: DC | PRN
  Administered 2018-09-01: 100 ug via INTRAVENOUS
  Administered 2018-09-01: 50 ug via INTRAVENOUS

## 2018-09-01 MED ORDER — FENTANYL CITRATE (PF) 100 MCG/2ML IJ SOLN
25.0000 ug | Freq: Once | INTRAMUSCULAR | Status: AC
  Administered 2018-09-01: 25 ug via INTRAVENOUS
  Filled 2018-09-01: qty 0.5

## 2018-09-01 MED ORDER — HYDROCODONE-ACETAMINOPHEN 7.5-325 MG PO TABS
1.0000 | ORAL_TABLET | ORAL | Status: DC | PRN
  Filled 2018-09-01: qty 2

## 2018-09-01 MED ORDER — LACTATED RINGERS IV SOLN
INTRAVENOUS | Status: DC
  Filled 2018-09-01: qty 1000

## 2018-09-01 MED ORDER — BACITRACIN-NEOMYCIN-POLYMYXIN 400-5-5000 EX OINT
TOPICAL_OINTMENT | CUTANEOUS | Status: DC | PRN
  Administered 2018-09-01: 1 via TOPICAL

## 2018-09-01 MED ORDER — ACETAMINOPHEN 500 MG PO TABS
500.0000 mg | ORAL_TABLET | Freq: Four times a day (QID) | ORAL | Status: DC
  Filled 2018-09-01: qty 1

## 2018-09-01 MED ORDER — OXYCODONE HCL 5 MG PO TABS
5.0000 mg | ORAL_TABLET | Freq: Once | ORAL | Status: DC | PRN
  Filled 2018-09-01: qty 1

## 2018-09-01 MED ORDER — LIDOCAINE 2% (20 MG/ML) 5 ML SYRINGE
INTRAMUSCULAR | Status: DC | PRN
  Administered 2018-09-01: 100 mg via INTRAVENOUS

## 2018-09-01 MED ORDER — ONDANSETRON HCL 4 MG/2ML IJ SOLN
INTRAMUSCULAR | Status: AC
  Filled 2018-09-01: qty 2

## 2018-09-01 MED ORDER — CEFAZOLIN SODIUM-DEXTROSE 2-4 GM/100ML-% IV SOLN
2.0000 g | INTRAVENOUS | Status: AC
  Administered 2018-09-01: 2 g via INTRAVENOUS
  Filled 2018-09-01: qty 100

## 2018-09-01 MED ORDER — CHLORHEXIDINE GLUCONATE 4 % EX LIQD
60.0000 mL | Freq: Once | CUTANEOUS | Status: DC
  Filled 2018-09-01: qty 118

## 2018-09-01 MED ORDER — FENTANYL CITRATE (PF) 100 MCG/2ML IJ SOLN
INTRAMUSCULAR | Status: AC
  Filled 2018-09-01: qty 2

## 2018-09-01 MED ORDER — POLYETHYLENE GLYCOL 3350 17 G PO PACK
17.0000 g | PACK | Freq: Every day | ORAL | Status: DC | PRN
  Filled 2018-09-01: qty 1

## 2018-09-01 MED ORDER — OXYCODONE HCL 5 MG/5ML PO SOLN
5.0000 mg | Freq: Once | ORAL | Status: DC | PRN
  Filled 2018-09-01: qty 5

## 2018-09-01 MED ORDER — GLYCOPYRROLATE PF 0.2 MG/ML IJ SOSY
PREFILLED_SYRINGE | INTRAMUSCULAR | Status: AC
  Filled 2018-09-01: qty 1

## 2018-09-01 MED ORDER — LACTATED RINGERS IV SOLN
INTRAVENOUS | Status: DC
  Administered 2018-09-01: 11:00:00 via INTRAVENOUS
  Filled 2018-09-01: qty 1000

## 2018-09-01 MED ORDER — ACETAMINOPHEN 10 MG/ML IV SOLN
1000.0000 mg | Freq: Once | INTRAVENOUS | Status: DC | PRN
  Filled 2018-09-01: qty 100

## 2018-09-01 MED ORDER — METHOCARBAMOL 500 MG PO TABS
500.0000 mg | ORAL_TABLET | Freq: Four times a day (QID) | ORAL | Status: DC | PRN
  Filled 2018-09-01: qty 1

## 2018-09-01 MED ORDER — METHOCARBAMOL 1000 MG/10ML IJ SOLN
500.0000 mg | Freq: Four times a day (QID) | INTRAVENOUS | Status: DC | PRN
  Filled 2018-09-01: qty 5

## 2018-09-01 MED ORDER — LACTATED RINGERS IV SOLN
INTRAVENOUS | Status: DC
  Administered 2018-09-01: 14:00:00 75 mL/h via INTRAVENOUS
  Filled 2018-09-01: qty 1000

## 2018-09-01 MED ORDER — METOCLOPRAMIDE HCL 5 MG/ML IJ SOLN
5.0000 mg | Freq: Three times a day (TID) | INTRAMUSCULAR | Status: DC | PRN
  Filled 2018-09-01: qty 2

## 2018-09-01 MED ORDER — MIDAZOLAM HCL 2 MG/2ML IJ SOLN
INTRAMUSCULAR | Status: AC
  Filled 2018-09-01: qty 2

## 2018-09-01 MED ORDER — BUPIVACAINE-EPINEPHRINE 0.25% -1:200000 IJ SOLN
INTRAMUSCULAR | Status: DC | PRN
  Administered 2018-09-01: 20 mL

## 2018-09-01 MED ORDER — METOCLOPRAMIDE HCL 5 MG PO TABS
5.0000 mg | ORAL_TABLET | Freq: Three times a day (TID) | ORAL | Status: DC | PRN
  Filled 2018-09-01: qty 2

## 2018-09-01 MED ORDER — LIDOCAINE 2% (20 MG/ML) 5 ML SYRINGE
INTRAMUSCULAR | Status: AC
  Filled 2018-09-01: qty 5

## 2018-09-01 MED ORDER — SODIUM CHLORIDE 0.9 % IR SOLN
Status: DC | PRN
  Administered 2018-09-01: 6000 mL

## 2018-09-01 MED ORDER — HYDROCODONE-ACETAMINOPHEN 5-325 MG PO TABS
1.0000 | ORAL_TABLET | Freq: Four times a day (QID) | ORAL | Status: DC | PRN
  Filled 2018-09-01: qty 1

## 2018-09-01 MED ORDER — POVIDONE-IODINE 10 % EX SWAB
2.0000 "application " | Freq: Once | CUTANEOUS | Status: DC
  Filled 2018-09-01: qty 2

## 2018-09-01 SURGICAL SUPPLY — 52 items
BANDAGE ELASTIC 4 VELCRO ST LF (GAUZE/BANDAGES/DRESSINGS) ×3 IMPLANT
BANDAGE ELASTIC 6 VELCRO ST LF (GAUZE/BANDAGES/DRESSINGS) ×3 IMPLANT
BLADE CUDA 5.5 (BLADE) IMPLANT
BLADE CUTTER GATOR 3.5 (BLADE) IMPLANT
BLADE CUTTER MENIS 5.5 (BLADE) IMPLANT
BLADE GREAT WHITE 4.2 (BLADE) IMPLANT
BLADE GREAT WHITE 4.2MM (BLADE)
BLADE SURG 15 STRL LF DISP TIS (BLADE) IMPLANT
BLADE SURG 15 STRL SS (BLADE)
BNDG COHESIVE 6X5 TAN NS LF (GAUZE/BANDAGES/DRESSINGS) ×3 IMPLANT
CANISTER SUCT 3000ML PPV (MISCELLANEOUS) ×1 IMPLANT
COVER WAND RF STERILE (DRAPES) ×3 IMPLANT
DRAPE ARTHROSCOPY W/POUCH 114 (DRAPES) ×3 IMPLANT
DRAPE SHEET LG 3/4 BI-LAMINATE (DRAPES) ×3 IMPLANT
DRSG EMULSION OIL 3X3 NADH (GAUZE/BANDAGES/DRESSINGS) ×3 IMPLANT
DURAPREP 26ML APPLICATOR (WOUND CARE) ×3 IMPLANT
ELECT MENISCUS 165MM 90D (ELECTRODE) IMPLANT
ELECT REM PT RETURN 9FT ADLT (ELECTROSURGICAL) ×3
ELECTRODE REM PT RTRN 9FT ADLT (ELECTROSURGICAL) IMPLANT
GAUZE SPONGE 4X4 12PLY STRL (GAUZE/BANDAGES/DRESSINGS) ×3 IMPLANT
GLOVE BIOGEL PI IND STRL 7.5 (GLOVE) IMPLANT
GLOVE BIOGEL PI INDICATOR 7.5 (GLOVE) ×2
GLOVE ECLIPSE 8.0 STRL XLNG CF (GLOVE) ×3 IMPLANT
GLOVE INDICATOR 8.5 STRL (GLOVE) ×3 IMPLANT
GLOVE SURG SS PI 7.5 STRL IVOR (GLOVE) ×2 IMPLANT
GOWN STRL REUS W/ TWL LRG LVL3 (GOWN DISPOSABLE) ×1 IMPLANT
GOWN STRL REUS W/ TWL XL LVL3 (GOWN DISPOSABLE) ×1 IMPLANT
GOWN STRL REUS W/TWL LRG LVL3 (GOWN DISPOSABLE) ×3
GOWN STRL REUS W/TWL XL LVL3 (GOWN DISPOSABLE) ×3
KIT TURNOVER CYSTO (KITS) ×3 IMPLANT
KNEE WRAP E Z 3 GEL PACK (MISCELLANEOUS) ×3 IMPLANT
MANIFOLD NEPTUNE II (INSTRUMENTS) ×2 IMPLANT
PACK ARTHROSCOPY DSU (CUSTOM PROCEDURE TRAY) ×3 IMPLANT
PACK BASIN DAY SURGERY FS (CUSTOM PROCEDURE TRAY) ×3 IMPLANT
PAD ABD 8X10 STRL (GAUZE/BANDAGES/DRESSINGS) ×4 IMPLANT
PAD CAST 4YDX4 CTTN HI CHSV (CAST SUPPLIES) IMPLANT
PADDING CAST COTTON 4X4 STRL (CAST SUPPLIES) ×3
PADDING CAST COTTON 6X4 STRL (CAST SUPPLIES) ×3 IMPLANT
PENCIL BUTTON HOLSTER BLD 10FT (ELECTRODE) IMPLANT
PORT APPOLLO RF 90DEGREE MULTI (SURGICAL WAND) ×2 IMPLANT
PROBE BIPOLAR ATHRO 135MM 90D (MISCELLANEOUS) ×1 IMPLANT
SET ARTHROSCOPY TUBING (MISCELLANEOUS)
SET ARTHROSCOPY TUBING LN (MISCELLANEOUS) ×1 IMPLANT
SPONGE SURGIFOAM ABS GEL 12-7 (HEMOSTASIS) IMPLANT
SUT ETHILON 3 0 PS 1 (SUTURE) ×3 IMPLANT
SYR CONTROL 10ML LL (SYRINGE) ×2 IMPLANT
TOWEL OR 17X26 10 PK STRL BLUE (TOWEL DISPOSABLE) ×3 IMPLANT
TUBE CONNECTING 12'X1/4 (SUCTIONS) ×1
TUBE CONNECTING 12X1/4 (SUCTIONS) ×1 IMPLANT
TUBING ARTHROSCOPY IRRIG 16FT (MISCELLANEOUS) ×2 IMPLANT
WATER STERILE IRR 500ML POUR (IV SOLUTION) ×3 IMPLANT
WRAP KNEE MAXI GEL POST OP (GAUZE/BANDAGES/DRESSINGS) ×2 IMPLANT

## 2018-09-01 NOTE — Interval H&P Note (Signed)
History and Physical Interval Note:  09/01/2018 10:46 AM  Christian [name]  has presented today for surgery, with the diagnosis of left knee medial meniscus tear.  The various methods of treatment have been discussed with the patient and family. After consideration of risks, benefits and other options for treatment, the patient has consented to  Procedure(s) with comments: KNEE ARTHROSCOPY WITH MEDIAL MENISECTOMY (Left) - 38min as a surgical intervention.  The patient's history has been reviewed, patient examined, no change in status, stable for surgery.  I have reviewed the patient's chart and labs.  Questions were answered to the patient's satisfaction.     Latanya Maudlin

## 2018-09-01 NOTE — Transfer of Care (Signed)
Immediate Anesthesia Transfer of Care Note  Patient: Christian [name]  Procedure(s) Performed: KNEE ARTHROSCOPY WITH MEDIAL MENISECTOMY (Left Knee)  Patient Location: PACU  Anesthesia Type:General  Level of Consciousness: drowsy  Airway & Oxygen Therapy: Patient Spontanous Breathing and Patient connected to face mask oxygen  Post-op Assessment: Report given to RN and Post -op Vital signs reviewed and stable  Post vital signs: Reviewed and stable  Last Vitals:  Vitals Value Taken Time  BP 126/77 09/01/2018 12:20 PM  Temp    Pulse 66 09/01/2018 12:24 PM  Resp 16 09/01/2018 12:24 PM  SpO2 94 % 09/01/2018 12:24 PM  Vitals shown include unvalidated device data.  Last Pain:  Vitals:   09/01/18 1017  TempSrc: Oral  PainSc: 0-No pain      Patients Stated Pain Goal: 6 (75/10/25 8527)  Complications: No apparent anesthesia complications

## 2018-09-01 NOTE — Anesthesia Postprocedure Evaluation (Signed)
Anesthesia Post Note  Patient: Christian [name]  Procedure(s) Performed: KNEE ARTHROSCOPY WITH MEDIAL MENISECTOMY (Left Knee)     Patient location during evaluation: PACU Anesthesia Type: General Level of consciousness: awake and alert Pain management: pain level controlled Vital Signs Assessment: post-procedure vital signs reviewed and stable Respiratory status: spontaneous breathing, nonlabored ventilation and respiratory function stable Cardiovascular status: blood pressure returned to baseline and stable Postop Assessment: no apparent nausea or vomiting Anesthetic complications: no    Last Vitals:  Vitals:   09/01/18 1300 09/01/18 1315  BP: (!) 150/81 (!) 155/77  Pulse: 61 68  Resp: 12 (!) 22  Temp:    SpO2: 93% 94%    Last Pain:  Vitals:   09/01/18 1300  TempSrc:   PainSc: [place]

## 2018-09-01 NOTE — Op Note (Signed)
Christian [name], DOLLAR MEDICAL RECORD [id number] ACCOUNT [id number] DATE OF BIRTH:31-Aug-1960 FACILITY: [facility] LOCATION: [facility] PHYSICIAN:Fredrich Cory Fransico Setters, MD  OPERATIVE REPORT  DATE OF PROCEDURE:  09/01/2018  SURGEON:  Latanya Maudlin, MD  ASSISTANT:  OR tech.  PREOPERATIVE DIAGNOSES: 1.  Chondromalacia, medial femoral condyle, left knee. 2.  Large complex tear of the posterior horn of the medial meniscus, left knee.  POSTOPERATIVE DIAGNOSES: 1.  Chondromalacia, medial femoral condyle, left knee. 2.  Large complex tear of the posterior horn of the medial meniscus, left knee.  OPERATION: 1.  Diagnostic arthroscopy, left knee. 2.  Medial meniscectomy, left knee. 3.  Abrasion chondroplasty, medial femoral condyle, left knee.  ESTIMATED BLOOD LOSS:  5 mL.  ANESTHESIA:  General.  DESCRIPTION OF PROCEDURE:  The routine orthopedic prep and draping of the left lower extremity was carried out with the left leg in a knee holder.  He had 2 g of IV Ancef.  At this time, I marked the appropriate left leg in the holding area.  I signed  the consent, and the timeout was carried out in the operating room for left knee.  A small punctate incision was made in the suprapatellar pouch.  Inflow cannula was inserted.  Knee was distended with saline.  Another small punctate incision was made in  the lateral joint, and arthroscope was entered from a lateral approach.  The only pathology noted going from lateral to medial was the medial side of the knee.  Basically, he had some mild synovitis over the lateral side.  The medial side was the main  issue.  I did another small incision medially and inserted the Wand 90 degree.  I did an abrasion chondroplasty of the medial femoral condyle.  At this time, no microfracture was necessary.  I then went back and noted a complex tear, very irregular, of  the medial meniscus.  I did a partial medial meniscectomy.  Also in exploring the knee on this side, he had  almost complete loss of the articular cartilage of the tibial plateau as well.  No procedure was necessary on that plateau.  I thoroughly  irrigated out the knee, reinspected it, and brought the scope out laterally and just saw a little synovitis on the lateral side.  Nothing else was needed to be done.  We thoroughly irrigated out the knee.  The remaining part of the knee was in good  condition.  I think he eventually will need a uni knee on that left side.  I was happy that we arthroscoped his knee first in order to evaluate this as well.  So after irrigating out the knee, I closed all 3 punctate incisions with 3-0 nylon suture.  I  injected 20 mL of 0.25% Marcaine with epinephrine into the knee joint, and a sterile Neosporin bundle dressing was applied.  So the findings were chondromalacia of the tibial plateau, chondromalacia of the medial femoral condyle (this was all medially),  and a complex tear of the medial meniscus posterior horn.  Postop, he will be on aspirin 325 mg b.i.d. starting today and for 2 weeks as an anticoagulant.  He had 2 g of IV Ancef to prevent infection.  He will be on hydrocodone 5/325 one every 4 hours  p.r.n. for pain.  He will ambulate partial to full weightbearing as tolerated with his cane or crutches.  He will see me in the office in about 2 weeks or prior to that if there is any problem.  We also  gave a full set of instructions to his wife.  LN/NUANCE  D:09/01/2018 T:09/01/2018 JOB:[id number]

## 2018-09-01 NOTE — Brief Op Note (Signed)
09/01/2018  12:14 PM  PATIENT:  Christian [name]  58 y.o. male  PRE-OPERATIVE DIAGNOSIS:  left knee medial meniscus tear and Chondromalacia of the medial Femoral Condyle,Left Knee.  POST-OPERATIVE DIAGNOSIS: Same as Pre-Op  PROCEDURE:  Procedure(s) with comments: KNEE ARTHROSCOPY WITH MEDIAL MENISECTOMY (Left) - 51min  SURGEON:  Surgeon(s) and Role:    * Latanya Maudlin, MD - Primary  PHYSICIAN ASSISTANT: OR Tech  ASSISTANTS: OR Tech   ANESTHESIA:   general  EBL:  5 mL   BLOOD ADMINISTERED:none  DRAINS: none   LOCAL MEDICATIONS USED:  MARCAINE 20ccof 0.25% with Epinephrine.    SPECIMEN:  No Specimen  DISPOSITION OF SPECIMEN:  N/A  COUNTS:  YES  TOURNIQUET:  * No tourniquets in log *  DICTATION: .Other Dictation: Dictation Number [id number]  PLAN OF CARE: Discharge to home after PACU  PATIENT DISPOSITION:  PACU - hemodynamically stable.   Delay start of Pharmacological VTE agent (>24hrs) due to surgical blood loss or risk of bleeding: yes

## 2018-09-01 NOTE — H&P (Signed)
Christian [name] is an 58 y.o. male.   Chief Complaint: pain in his Left Knee. HPI: He developed progressive pain in his Left Knee.  Past Medical History:  Diagnosis Date  . Chronic back pain   . Chronic knee pain   . DDD (degenerative disc disease), lumbosacral   . GAD (generalized anxiety disorder)   . History of kidney stones   . Hyperlipidemia   . Hypertension   . Kidney stone   . MDD (major depressive disorder)   . OA (osteoarthritis)    knees  . OSA on CPAP   . Pre-diabetes    documented by pcp at annual visit 05-26-2018 in epic , dr Clarise Cruz furr (pt denies)  . Spinal stenosis, lumbar region with neurogenic claudication   . Weakness of extremity    per pt upper and lower extermities due to back issues    Past Surgical History:  Procedure Laterality Date  . ANTERIOR CERVICAL DECOMPRESSION/DISCECTOMY FUSION 4 LEVELS N/A 12/02/2012   Procedure: Cervical three-four, Cervical four-five, Cervical five-six, Cervical six-seven Anterior cervical decompression/diskectomy/fusion;  Surgeon: Erline Levine, MD;  Location: [place] [facility];  Service: Neurosurgery;  Laterality: N/A;  Cervical three-four, Cervical four-five, Cervical five-six, Cervical six-seven Anterior cervical decompression/diskectomy/fusion  . LUMBAR DISC SURGERY  12/20/2015   L4-5    History reviewed. No pertinent family history. Social History:  reports that he has been smoking cigarettes. He has a 15.00 pack-year smoking history. He has never used smokeless tobacco. He reports that he does not drink alcohol or use drugs.  Allergies:  Allergies  Allergen Reactions  . Pork-Derived Products     PT DOES NOT EAT PORK    Medications Prior to Admission  Medication Sig Dispense Refill  . amLODipine (NORVASC) 10 MG tablet Take 10 mg by mouth daily.     . carvedilol (COREG) 12.5 MG tablet Take 12.5 mg by mouth 2 (two) times daily with a meal.    . Diclofenac Sodium CR 100 MG 24 hr tablet Take 1 tablet (100 mg total) by mouth  daily. (Patient taking differently: Take 100 mg by mouth 2 (two) times a day. ) 7 tablet 0  . DULoxetine (CYMBALTA) 30 MG capsule Take 30 mg by mouth 3 (three) times daily.    Marland Kitchen gabapentin (NEURONTIN) 800 MG tablet Take 800 mg by mouth 3 (three) times daily.    Marland Kitchen HYDROcodone-acetaminophen (NORCO/VICODIN) 5-325 MG tablet Take 1 tablet by mouth every 6 (six) hours as needed for moderate pain.    Marland Kitchen lovastatin (MEVACOR) 40 MG tablet Take 40 mg by mouth at bedtime.      No results found for this or any previous visit (from the past 48 hour(s)). No results found.  Review of Systems  Constitutional: Negative.   HENT: Negative.   Eyes: Negative.   Respiratory: Negative.   Cardiovascular: Negative.   Gastrointestinal: Negative.   Genitourinary: Negative.   Musculoskeletal: Positive for joint pain.  Skin: Negative.   Neurological: Negative.   Endo/Heme/Allergies: Negative.   Psychiatric/Behavioral: Negative.     Blood pressure 139/75, pulse 64, temperature 98.2 F (36.8 C), temperature source Oral, resp. rate 18, height 5\' 6"  (1.676 m), weight 104.5 kg, SpO2 96 %. Physical Exam  Constitutional: He appears well-developed.  HENT:  Head: Normocephalic.  Eyes: Pupils are equal, round, and reactive to light.  Neck: Normal range of motion.  Cardiovascular: Normal rate.  Respiratory: Effort normal.  GI: Soft.  Musculoskeletal:     Comments: Painful Range of Motion in  his Left Knee.  Neurological: He is alert.  Skin: Skin is warm.  Psychiatric: He has a normal mood and affect.     Assessment/Plan Arthroscopic Medial Meniscectomy of his Left Knee  Ranee Gosselinonald Lora Chavers, MD 09/01/2018, 10:37 AM

## 2018-09-01 NOTE — Anesthesia Procedure Notes (Signed)
Procedure Name: LMA Insertion Date/Time: 09/01/2018 11:30 AM Performed by: Gwyndolyn Saxon, CRNA Pre-anesthesia Checklist: Patient identified, Emergency Drugs available, Suction available and Patient being monitored Patient Re-evaluated:Patient Re-evaluated prior to induction Oxygen Delivery Method: Circle system utilized Preoxygenation: Pre-oxygenation with 100% oxygen Induction Type: IV induction Ventilation: Mask ventilation without difficulty LMA: LMA inserted LMA Size: 4.0 Number of attempts: 1 Airway Equipment and Method: Patient positioned with wedge pillow Placement Confirmation: positive ETCO2 and breath sounds checked- equal and bilateral Tube secured with: Tape Dental Injury: Teeth and Oropharynx as per pre-operative assessment

## 2018-09-02 ENCOUNTER — Encounter (HOSPITAL_BASED_OUTPATIENT_CLINIC_OR_DEPARTMENT_OTHER): Payer: Self-pay | Admitting: Orthopedic Surgery

## 2018-09-08 ENCOUNTER — Telehealth: Payer: Self-pay | Admitting: Vascular Surgery

## 2018-10-14 ENCOUNTER — Other Ambulatory Visit: Payer: Self-pay | Admitting: *Deleted

## 2020-08-02 ENCOUNTER — Other Ambulatory Visit (HOSPITAL_COMMUNITY): Payer: Medicare Other

## 2020-08-02 ENCOUNTER — Encounter (HOSPITAL_COMMUNITY): Admission: RE | Admit: 2020-08-02 | Payer: Medicare Other | Source: Ambulatory Visit

## 2020-08-06 ENCOUNTER — Encounter (HOSPITAL_COMMUNITY): Admission: RE | Payer: Self-pay | Source: Ambulatory Visit

## 2020-08-06 ENCOUNTER — Ambulatory Visit (HOSPITAL_COMMUNITY): Admission: RE | Admit: 2020-08-06 | Payer: Medicare Other | Source: Ambulatory Visit | Admitting: Orthopedic Surgery

## 2020-08-06 SURGERY — ARTHROPLASTY, KNEE, TOTAL
Anesthesia: Choice | Site: Knee | Laterality: Right

## 2020-09-06 ENCOUNTER — Other Ambulatory Visit (HOSPITAL_COMMUNITY): Payer: Medicare Other

## 2021-01-07 NOTE — Progress Notes (Signed)
Sent message, via epic in basket, requesting orders in epic from surgeon.  

## 2021-01-08 NOTE — H&P (Addendum)
TOTAL KNEE ADMISSION H&P  Patient is being admitted for right total knee arthroplasty.  Subjective:  Chief Complaint: Right knee pain.  HPI: Christian [name], 60 y.o. male has a history of pain and functional disability in the right knee due to arthritis and has failed non-surgical conservative treatments for greater than 12 weeks to include corticosteriod injections and activity modification. Onset of symptoms was gradual, starting  several  years ago with gradually worsening course since that time. The patient noted prior procedures on the knee to include  arthroscopy and menisectomy on the right knee.  Patient currently rates pain in the right knee at 7 out of 10 with activity. Patient has night pain, worsening of pain with activity and weight bearing, pain with passive range of motion, and crepitus. Patient has evidence of  bone-on-bone arthritis in the medial and patellofemoral compartments of the right knee with significant varus deformity  by imaging studies. There is no active infection.  Patient Active Problem List   Diagnosis Date Noted   Spinal stenosis in cervical region 11/03/2012   Cervical disc disorder with radiculopathy of cervical region 11/03/2012   Essential hypertension, benign 11/07/2011   History of kidney stones 11/07/2011    Past Medical History:  Diagnosis Date   Chronic back pain    Chronic knee pain    DDD (degenerative disc disease), lumbosacral    GAD (generalized anxiety disorder)    History of kidney stones    Hyperlipidemia    Hypertension    Kidney stone    MDD (major depressive disorder)    OA (osteoarthritis)    knees   OSA on CPAP    Pre-diabetes    documented by pcp at annual visit 05-26-2018 in epic , dr Huntley Dec furr (pt denies)   Spinal stenosis, lumbar region with neurogenic claudication    Weakness of extremity    per pt upper and lower extermities due to back issues    Past Surgical History:  Procedure Laterality Date   ANTERIOR CERVICAL  DECOMPRESSION/DISCECTOMY FUSION 4 LEVELS N/A 12/02/2012   Procedure: Cervical three-four, Cervical four-five, Cervical five-six, Cervical six-seven Anterior cervical decompression/diskectomy/fusion;  Surgeon: Maeola Harman, MD;  Location: [facility];  Service: Neurosurgery;  Laterality: N/A;  Cervical three-four, Cervical four-five, Cervical five-six, Cervical six-seven Anterior cervical decompression/diskectomy/fusion   KNEE ARTHROSCOPY WITH MEDIAL MENISECTOMY Left 09/01/2018   Procedure: KNEE ARTHROSCOPY WITH MEDIAL MENISECTOMY;  Surgeon: Ranee Gosselin, MD;  Location: [facility];  Service: Orthopedics;  Laterality: Left;    LUMBAR DISC SURGERY  12/20/2015   L4-5    Prior to Admission medications   Medication Sig Start Date End Date Taking? Authorizing Provider  amLODipine (NORVASC) 10 MG tablet Take 10 mg by mouth daily.     [provider]  baclofen (LIORESAL) 20 MG tablet Take 20 mg by mouth 3 (three) times daily.    [provider]  carvedilol (COREG) 25 MG tablet Take 25 mg by mouth 2 (two) times daily with a meal.    [provider]  DULoxetine (CYMBALTA) 30 MG capsule Take 30 mg by mouth 3 (three) times daily.    [provider]  gabapentin (NEURONTIN) 800 MG tablet Take 800 mg by mouth 3 (three) times daily.    [provider]  hydrochlorothiazide (HYDRODIURIL) 25 MG tablet Take 25 mg by mouth daily.    [provider]  HYDROcodone-acetaminophen (NORCO/VICODIN) 5-325 MG tablet Take 1 tablet by mouth in the morning, at noon,  and at bedtime.    [provider]  losartan (COZAAR) 100 MG tablet Take 100 mg by mouth daily.    [provider]  lovastatin (MEVACOR) 40 MG tablet Take 40 mg by mouth at bedtime.    [provider]  meloxicam (MOBIC) 15 MG tablet Take 15 mg by mouth daily.    [provider]  pantoprazole (PROTONIX) 40 MG tablet Take 40 mg by mouth daily.    [provider]  tiZANidine (ZANAFLEX) 4 MG capsule Take 4 mg by mouth 3 (three) times daily.    [provider]  triamcinolone cream (KENALOG) 0.1 % Apply 1 application topically 2 (two) times daily.    [provider]  Vitamin D, Ergocalciferol, (DRISDOL) 1.25 MG (50000 UNIT) CAPS capsule Take 50,000 Units by mouth every Tuesday.    [provider]    Allergies  Allergen Reactions   Pork-Derived Products     PT DOES NOT EAT PORK    Social History   Socioeconomic History   Marital status: Married    Spouse name: [name]   Number of children: 4   Years of education: College   Highest education level: Not on file  Occupational History    Employer: [employer]  Tobacco Use   Smoking status: Every Day    Packs/day: 0.50    Years: 30.00    Pack years: 15.00    Types: Cigarettes   Smokeless tobacco: Never  Vaping Use   Vaping Use: Never used  Substance and Sexual Activity   Alcohol use: No   Drug use: No   Sexual activity: Not on file  Other Topics Concern   Not on file  Social History Narrative   Lives at home with family.   Caffeine Use: 1 cup daily   Social Determinants of Health   Financial Resource Strain: Not on file  Food Insecurity: Not on file  Transportation Needs: Not on file  Physical Activity: Not on file  Stress: Not on file  Social Connections: Not on file  Intimate Partner Violence: Not on file    Tobacco Use: Not on file   Social History   Substance and Sexual Activity  Alcohol Use No    No family history on file.  Review of Systems  Constitutional:  Negative for chills and fever.  HENT:  Negative for congestion, sore throat and tinnitus.   Eyes:  Negative for double vision, photophobia and pain.  Respiratory:  Negative for cough, shortness of breath and wheezing.   Cardiovascular:  Negative for chest pain, palpitations and orthopnea.  Gastrointestinal:  Negative for heartburn, nausea and vomiting.   Genitourinary:  Negative for dysuria, frequency and urgency.  Musculoskeletal:  Positive for joint pain.  Neurological:  Negative for dizziness, weakness and headaches.   Objective:  Physical Exam: Well nourished and well developed.  General: Alert and oriented x3, cooperative and pleasant, no acute distress.  Head: normocephalic, atraumatic, neck supple.  Eyes: EOMI.  Respiratory: breath sounds clear in all fields, no wheezing, rales, or rhonchi. Cardiovascular: Regular rate and rhythm, no murmurs, gallops or rubs.  Abdomen: non-tender to palpation and soft, normoactive bowel sounds. Musculoskeletal:  Right Knee Exam:  No effusion.  Significant varus deformity.  Range of motion is 5-120 degrees.  Marked crepitus on range of motion of the knee.  Medial greater than lateral joint line tenderness.  Stable knee.   Calves soft and nontender. Motor function intact in LE. Strength 5/5 LE bilaterally.  Neuro: Distal pulses 2+. Sensation to light touch intact in LE.  Imaging Review Plain radiographs demonstrate severe degenerative joint disease of the right knee. The overall alignment is mild varus. The bone quality appears to be adequate for age and reported activity level.  Assessment/Plan:  End stage arthritis, right knee   The patient history, physical examination, clinical judgment of the provider and imaging studies are consistent with end stage degenerative joint disease of the right knee and total knee arthroplasty is deemed medically necessary. The treatment options including medical management, injection therapy arthroscopy and arthroplasty were discussed at length. The risks and benefits of total knee arthroplasty were presented and reviewed. The risks due to aseptic loosening, infection, stiffness, patella tracking problems, thromboembolic complications and other imponderables were discussed. The patient acknowledged the explanation, agreed to proceed with the plan and consent  was signed. Patient is being admitted for inpatient treatment for surgery, pain control, PT, OT, prophylactic antibiotics, VTE prophylaxis, progressive ambulation and ADLs and discharge planning. The patient is planning to be discharged  home .   Patient's anticipated LOS is less than 2 midnights, meeting these requirements: - Lives within 1 hour of care - Has a competent adult at home to recover with post-op recover - NO history of  - Coronary Artery Disease  - Heart failure  - Heart attack  - DVT/VTE  - Cardiac arrhythmia  - Respiratory Failure/COPD  - Renal failure  - Anemia  - Advanced Liver disease  Therapy Plans: [facility] Disposition: Home with wife Planned DVT Prophylaxis: Xarelto 10 mg QD DME Needed: None PCP: Ninetta Lights, MD Neurologist: Jeri Cos, MD (clearance received) TXA: IV Allergies: NKDA Anesthesia Concerns: None BMI: 36 Last HgbA1c: 6.3% as of 04/2020. Rechecking with preop labs.  Pharmacy: [facility] [address])  Other: - Patient with TIA discovery in 07/2020 - Norco 5-325 TID. Discussed oxycodone postoperatively.    - Patient was instructed on what medications to stop prior to surgery. - Follow-up visit in 2 weeks with Dr. Lequita Halt - Begin physical therapy following surgery - Pre-operative lab work as pre-surgical testing - Prescriptions will be provided in hospital at time of discharge  Arther Abbott, PA-C Orthopedic Surgery [facility] [place]

## 2021-01-14 NOTE — Progress Notes (Addendum)
Anesthesia Review:  PCP: Ninetta Lights- LOV 09/04/20  Preop clearance by Gardiner Fanti, PA on  01/14/21  Clearance on chart from DR Lirim Tonuzi dated 01/11/21 on chart.  Cardiologist : none  Chest x-ray : EKG : have requested 12 lead ekg tracing by fax. From 01/14/21.   On chart  10/29/20- Monitor  Echo : Stress test: Cardiac Cath :  Activity level:  can do a flight of stiars without difficulty  Sleep Study/ CPAP : n longer uses cpap  Fasting Blood Sugar :      / Checks Blood Sugar -- times a day:   Blood Thinner/ Instructions /Last Dose: ASA / Instructions/ Last Dose :   Hx of prediabetes no longer prediabetic per pt  Hgba1c-01/16/21- 5.6  Covid test 01/24/21

## 2021-01-14 NOTE — Progress Notes (Signed)
DUE TO COVID-19 ONLY ONE VISITOR IS ALLOWED TO COME WITH YOU AND STAY IN THE WAITING ROOM ONLY DURING PRE OP AND PROCEDURE DAY OF SURGERY.  2 VISITOR  MAY VISIT WITH YOU AFTER SURGERY IN YOUR PRIVATE ROOM DURING VISITING HOURS ONLY!  YOU NEED TO HAVE A COVID 19 TEST ON__11/05/2020 @_  @_from  8am-3pm _____, THIS TEST MUST BE DONE BEFORE SURGERY,  Covid test is done at [address] [place], [address].  This is a drive thru.  No appt required. Please see map.                 Your procedure is scheduled on:  01/28/2021   Report to [facility] Main  Entrance   Report to admitting at    [phone]     Call this number if you have problems the morning of surgery [phone]    REMEMBER: NO  SOLID FOOD CANDY OR GUM AFTER MIDNIGHT. CLEAR LIQUIDS UNTIL         . NOTHING BY MOUTH EXCEPT CL  0830am EAR LIQUIDS UNTIL  0830am   . PLEASE FINISH ENSURE DRINK PER SURGEON ORDER  WHICH NEEDS TO BE COMPLETED AT   0830am    .      CLEAR LIQUID DIET   Foods Allowed                                                                    Coffee and tea, regular and decaf                            Fruit ices (not with fruit pulp)                                      Iced Popsicles                                    Carbonated beverages, regular and diet                                    Cranberry, grape and apple juices Sports drinks like Gatorade Lightly seasoned clear broth or consume(fat free) Sugar, honey syrup ___________________________________________________________________      BRUSH YOUR TEETH MORNING OF SURGERY AND RINSE YOUR MOUTH OUT, NO CHEWING GUM CANDY OR MINTS.     Take these medicines the morning of surgery with A SIP OF WATER:   amlodipine, coreg, cymbalta, gabapentin, protonix   DO NOT TAKE ANY DIABETIC MEDICATIONS DAY OF YOUR SURGERY                               You may not have any metal on your body including hair pins and              piercings  Do not wear  jewelry, make-up, lotions, powders or perfumes, deodorant  Do not wear nail polish on your fingernails.  Do not shave  48 hours prior to surgery.              Men may shave face and neck.   Do not bring valuables to the hospital. [place].  Contacts, dentures or bridgework may not be worn into surgery.  Leave suitcase in the car. After surgery it may be brought to your room.     Patients discharged the day of surgery will not be allowed to drive home. IF YOU ARE HAVING SURGERY AND GOING HOME THE SAME DAY, YOU MUST HAVE AN ADULT TO DRIVE YOU HOME AND BE WITH YOU FOR 24 HOURS. YOU MAY GO HOME BY TAXI OR UBER OR ORTHERWISE, BUT AN ADULT MUST ACCOMPANY YOU HOME AND STAY WITH YOU FOR 24 HOURS.  Name and phone number of your driver:  Special Instructions: N/A              Please read over the following fact sheets you were given: _____________________________________________________________________  [facility] - Preparing for Surgery Before surgery, you can play an important role.  Because skin is not sterile, your skin needs to be as free of germs as possible.  You can reduce the number of germs on your skin by washing with CHG (chlorahexidine gluconate) soap before surgery.  CHG is an antiseptic cleaner which kills germs and bonds with the skin to continue killing germs even after washing. Please DO NOT use if you have an allergy to CHG or antibacterial soaps.  If your skin becomes reddened/irritated stop using the CHG and inform your nurse when you arrive at Short Stay. Do not shave (including legs and underarms) for at least 48 hours prior to the first CHG shower.  You may shave your face/neck. Please follow these instructions carefully:  1.  Shower with CHG Soap the night before surgery and the  morning of Surgery.  2.  If you choose to wash your hair, wash your hair first as usual with your  normal  shampoo.  3.  After you shampoo,  rinse your hair and body thoroughly to remove the  shampoo.                           4.  Use CHG as you would any other liquid soap.  You can apply chg directly  to the skin and wash                       Gently with a scrungie or clean washcloth.  5.  Apply the CHG Soap to your body ONLY FROM THE NECK DOWN.   Do not use on face/ open                           Wound or open sores. Avoid contact with eyes, ears mouth and genitals (private parts).                       Wash face,  Genitals (private parts) with your normal soap.             6.  Wash thoroughly, paying special attention to the area where your surgery  will be performed.  7.  Thoroughly rinse your body with  warm water from the neck down.  8.  DO NOT shower/wash with your normal soap after using and rinsing off  the CHG Soap.                9.  Pat yourself dry with a clean towel.            10.  Wear clean pajamas.            11.  Place clean sheets on your bed the night of your first shower and do not  sleep with pets. Day of Surgery : Do not apply any lotions/deodorants the morning of surgery.  Please wear clean clothes to the hospital/surgery center.  FAILURE TO FOLLOW THESE INSTRUCTIONS MAY RESULT IN THE CANCELLATION OF YOUR SURGERY PATIENT SIGNATURE_________________________________  NURSE SIGNATURE__________________________________  ________________________________________________________________________

## 2021-01-15 NOTE — Progress Notes (Signed)
DUE TO COVID-19 ONLY ONE VISITOR IS ALLOWED TO COME WITH YOU AND STAY IN THE WAITING ROOM ONLY DURING PRE OP AND PROCEDURE DAY OF SURGERY.  2 VISITOR  MAY VISIT WITH YOU AFTER SURGERY IN YOUR PRIVATE ROOM DURING VISITING HOURS ONLY!  YOU NEED TO HAVE A COVID 19 TEST ON__11/05/2020 @_  @_from  8am-3pm _____, THIS TEST MUST BE DONE BEFORE SURGERY,  Covid test is done at [address] [place], [address].  This is a drive thru.  No appt required. Please see map.                 Your procedure is scheduled on:    01/28/2021   Report to [facility] Main  Entrance   Report to admitting at   [phone]     Call this number if you have problems the morning of surgery [phone]    REMEMBER: NO  SOLID FOOD CANDY OR GUM AFTER MIDNIGHT. CLEAR LIQUIDS UNTIL    0830am        . NOTHING BY MOUTH EXCEPT CLEAR LIQUIDS UNTIL  0830am   . PLEASE FINISH ENSURE DRINK PER SURGEON ORDER  WHICH NEEDS TO BE COMPLETED AT    0830am   .      CLEAR LIQUID DIET   Foods Allowed                                                                    Coffee and tea, regular and decaf                            Fruit ices (not with fruit pulp)                                      Iced Popsicles                                    Carbonated beverages, regular and diet                                    Cranberry, grape and apple juices Sports drinks like Gatorade Lightly seasoned clear broth or consume(fat free) Sugar, honey syrup ___________________________________________________________________      BRUSH YOUR TEETH MORNING OF SURGERY AND RINSE YOUR MOUTH OUT, NO CHEWING GUM CANDY OR MINTS.     Take these medicines the morning of surgery with A SIP OF WATER:   amlodipine, coreg, cymbalta, gabapentin, protonix   DO NOT TAKE ANY DIABETIC MEDICATIONS DAY OF YOUR SURGERY                               You may not have any metal on your body including hair pins and              piercings  Do not wear  jewelry, make-up, lotions, powders or perfumes, deodorant  Do not wear nail polish on your fingernails.  Do not shave  48 hours prior to surgery.              Men may shave face and neck.   Do not bring valuables to the hospital. [place].  Contacts, dentures or bridgework may not be worn into surgery.  Leave suitcase in the car. After surgery it may be brought to your room.     Patients discharged the day of surgery will not be allowed to drive home. IF YOU ARE HAVING SURGERY AND GOING HOME THE SAME DAY, YOU MUST HAVE AN ADULT TO DRIVE YOU HOME AND BE WITH YOU FOR 24 HOURS. YOU MAY GO HOME BY TAXI OR UBER OR ORTHERWISE, BUT AN ADULT MUST ACCOMPANY YOU HOME AND STAY WITH YOU FOR 24 HOURS.  Name and phone number of your driver:  Special Instructions: N/A              Please read over the following fact sheets you were given: _____________________________________________________________________  [facility] - Preparing for Surgery Before surgery, you can play an important role.  Because skin is not sterile, your skin needs to be as free of germs as possible.  You can reduce the number of germs on your skin by washing with CHG (chlorahexidine gluconate) soap before surgery.  CHG is an antiseptic cleaner which kills germs and bonds with the skin to continue killing germs even after washing. Please DO NOT use if you have an allergy to CHG or antibacterial soaps.  If your skin becomes reddened/irritated stop using the CHG and inform your nurse when you arrive at Short Stay. Do not shave (including legs and underarms) for at least 48 hours prior to the first CHG shower.  You may shave your face/neck. Please follow these instructions carefully:  1.  Shower with CHG Soap the night before surgery and the  morning of Surgery.  2.  If you choose to wash your hair, wash your hair first as usual with your  normal  shampoo.  3.  After you shampoo,  rinse your hair and body thoroughly to remove the  shampoo.                           4.  Use CHG as you would any other liquid soap.  You can apply chg directly  to the skin and wash                       Gently with a scrungie or clean washcloth.  5.  Apply the CHG Soap to your body ONLY FROM THE NECK DOWN.   Do not use on face/ open                           Wound or open sores. Avoid contact with eyes, ears mouth and genitals (private parts).                       Wash face,  Genitals (private parts) with your normal soap.             6.  Wash thoroughly, paying special attention to the area where your surgery  will be performed.  7.  Thoroughly rinse your body with  warm water from the neck down.  8.  DO NOT shower/wash with your normal soap after using and rinsing off  the CHG Soap.                9.  Pat yourself dry with a clean towel.            10.  Wear clean pajamas.            11.  Place clean sheets on your bed the night of your first shower and do not  sleep with pets. Day of Surgery : Do not apply any lotions/deodorants the morning of surgery.  Please wear clean clothes to the hospital/surgery center.  FAILURE TO FOLLOW THESE INSTRUCTIONS MAY RESULT IN THE CANCELLATION OF YOUR SURGERY PATIENT SIGNATURE_________________________________  NURSE SIGNATURE__________________________________  ________________________________________________________________________

## 2021-01-16 ENCOUNTER — Encounter (HOSPITAL_COMMUNITY): Payer: Self-pay

## 2021-01-16 ENCOUNTER — Encounter (HOSPITAL_COMMUNITY)
Admission: RE | Admit: 2021-01-16 | Discharge: 2021-01-16 | Disposition: A | Discharge status: Home / Self Care | Payer: Medicare Other | Source: Ambulatory Visit | Attending: Orthopedic Surgery | Admitting: Orthopedic Surgery

## 2021-01-16 ENCOUNTER — Other Ambulatory Visit: Payer: Self-pay

## 2021-01-16 VITALS — BP 116/67 | HR 77 | Temp 98.4°F | Resp 16 | Ht 65.0 in | Wt 211.0 lb

## 2021-01-16 DIAGNOSIS — M1711 Unilateral primary osteoarthritis, right knee: Secondary | ICD-10-CM | POA: Diagnosis not present

## 2021-01-16 DIAGNOSIS — I10 Essential (primary) hypertension: Secondary | ICD-10-CM | POA: Diagnosis not present

## 2021-01-16 DIAGNOSIS — R7303 Prediabetes: Secondary | ICD-10-CM

## 2021-01-16 DIAGNOSIS — Z01812 Encounter for preprocedural laboratory examination: Secondary | ICD-10-CM | POA: Diagnosis present

## 2021-01-16 DIAGNOSIS — F17200 Nicotine dependence, unspecified, uncomplicated: Secondary | ICD-10-CM | POA: Diagnosis not present

## 2021-01-16 DIAGNOSIS — I6782 Cerebral ischemia: Secondary | ICD-10-CM | POA: Insufficient documentation

## 2021-01-16 DIAGNOSIS — Z79899 Other long term (current) drug therapy: Secondary | ICD-10-CM | POA: Insufficient documentation

## 2021-01-16 DIAGNOSIS — G4730 Sleep apnea, unspecified: Secondary | ICD-10-CM | POA: Insufficient documentation

## 2021-01-16 DIAGNOSIS — Z01818 Encounter for other preprocedural examination: Secondary | ICD-10-CM

## 2021-01-16 HISTORY — DX: Sleep apnea, unspecified: G47.30

## 2021-01-16 LAB — COMPREHENSIVE METABOLIC PANEL
ALT: 13 U/L (ref 0–44)
AST: 17 U/L (ref 15–41)
Albumin: 3.8 g/dL (ref 3.5–5.0)
Alkaline Phosphatase: 71 U/L (ref 38–126)
Anion gap: 8 (ref 5–15)
BUN: 14 mg/dL (ref 8–23)
CO2: 24 mmol/L (ref 22–32)
Calcium: 8.9 mg/dL (ref 8.9–10.3)
Chloride: 105 mmol/L (ref 98–111)
Creatinine, Ser: 0.86 mg/dL (ref 0.61–1.24)
GFR, Estimated: >60 mL/min (ref >60)
Glucose, Bld: 150 mg/dL — ABNORMAL HIGH (ref 70–99)
Potassium: 4 mmol/L (ref 3.5–5.1)
Sodium: 137 mmol/L (ref 135–145)
Total Bilirubin: 0.8 mg/dL (ref 0.3–1.2)
Total Protein: 7.3 g/dL (ref 6.5–8.1)

## 2021-01-16 LAB — CBC
HCT: 46.9 % (ref 39.0–52.0)
Hemoglobin: 15.7 g/dL (ref 13.0–17.0)
MCH: 28.4 pg (ref 26.0–34.0)
MCHC: 33.5 g/dL (ref 30.0–36.0)
MCV: 85 fL (ref 80.0–100.0)
Platelets: 208 10*3/uL (ref 150–400)
RBC: 5.52 MIL/uL (ref 4.22–5.81)
RDW: 13.9 % (ref 11.5–15.5)
WBC: 3.7 10*3/uL — ABNORMAL LOW (ref 4.0–10.5)
nRBC: 0 % (ref 0.0–0.2)

## 2021-01-16 LAB — HEMOGLOBIN A1C
Hgb A1c MFr Bld: 5.6 % (ref 4.8–5.6)
Mean Plasma Glucose: 114.02 mg/dL

## 2021-01-16 LAB — SURGICAL PCR SCREEN
MRSA, PCR: NEGATIVE
Staphylococcus aureus: NEGATIVE

## 2021-01-16 LAB — PROTIME-INR
INR: 1 (ref 0.8–1.2)
Prothrombin Time: 13.1 seconds (ref 11.4–15.2)

## 2021-01-17 NOTE — Progress Notes (Signed)
Anesthesia Chart Review   Case: [id number] Date/Time: 01/28/21 1122   Procedure: TOTAL KNEE ARTHROPLASTY (Right: Knee)   Anesthesia type: Choice   Pre-op diagnosis: right knee osteoarthritis   Location: [facility] / [facility]   Surgeons: Ollen Gross, MD       DISCUSSION:60 y.o. every day smoker with h/o HTN, sleep apnea, right knee OA scheduled for above procedure 01/28/2021 with Dr. Ollen Gross.   Pt seen by PCP for preoperative evaluation 01/14/2021. Per OV note, "He is moderate medical risk for moderate risk surgical procedure; stable w/ his clinical baseline and optimized for surgery."  Evaluated by neurology for incidental intracranial microvascular ischemic changes on imaging. Clearance from neurologist on chart which states pt is low risk for planned procedure.   Anticipate pt can proceed with planned procedure barring acute status change.   VS: BP 116/67   Pulse 77   Temp 36.9 C (Oral)   Resp 16   Ht 5\' 5"  (1.651 m)   Wt 95.7 kg   SpO2 96%   BMI 35.11 kg/m   PROVIDERS: ., MD is PCP    LABS: Labs reviewed: Acceptable for surgery. (all labs ordered are listed, but only abnormal results are displayed)  Labs Reviewed  CBC - Abnormal; Notable for the following components:      Result Value   WBC 3.7 (*)    All other components within normal limits  COMPREHENSIVE METABOLIC PANEL - Abnormal; Notable for the following components:   Glucose, Bld 150 (*)    All other components within normal limits  SURGICAL PCR SCREEN  PROTIME-INR  HEMOGLOBIN A1C     IMAGES:   EKG: On chart   CV: Echo 08/29/2020 SUMMARY  The left ventricular size is normal.  LV ejection fraction = >70%.  The left ventricle is hyperdynamic.  Left ventricular filling pattern is indeterminate.  The right ventricle is normal in size and function.  There was insufficient TR detected to calculate RV systolic pressure.  Estimated right atrial pressure is 5 mmHg.10/29/2020  There is no  significant valvular stenosis or regurgitation.  There is no pericardial effusion.  There is no comparison study available.  Past Medical History:  Diagnosis Date   Chronic back pain    Chronic knee pain    DDD (degenerative disc disease), lumbosacral    History of kidney stones    Hyperlipidemia    Hypertension    Kidney stone    OA (osteoarthritis)    knees   Sleep apnea    Spinal stenosis, lumbar region with neurogenic claudication    Weakness of extremity    per pt upper and lower extermities due to back issues    Past Surgical History:  Procedure Laterality Date   ANTERIOR CERVICAL DECOMPRESSION/DISCECTOMY FUSION 4 LEVELS N/A 12/02/2012   Procedure: Cervical three-four, Cervical four-five, Cervical five-six, Cervical six-seven Anterior cervical decompression/diskectomy/fusion;  Surgeon: 02/01/2013, MD;  Location: [facility];  Service: Neurosurgery;  Laterality: N/A;  Cervical three-four, Cervical four-five, Cervical five-six, Cervical six-seven Anterior cervical decompression/diskectomy/fusion   KNEE ARTHROSCOPY WITH MEDIAL MENISECTOMY Left 09/01/2018   Procedure: KNEE ARTHROSCOPY WITH MEDIAL MENISECTOMY;  Surgeon: 11/01/2018, MD;  Location: [facility];  Service: Orthopedics;  Laterality: Left;  [facility]   LUMBAR DISC SURGERY  12/20/2015   L4-5    MEDICATIONS:  amLODipine (NORVASC) 10 MG tablet   baclofen (LIORESAL) 20 MG tablet   carvedilol (COREG) 25 MG tablet   DULoxetine (CYMBALTA) 30  MG capsule   gabapentin (NEURONTIN) 800 MG tablet   hydrochlorothiazide (HYDRODIURIL) 25 MG tablet   HYDROcodone-acetaminophen (NORCO/VICODIN) 5-325 MG tablet   losartan (COZAAR) 100 MG tablet   lovastatin (MEVACOR) 40 MG tablet   meloxicam (MOBIC) 15 MG tablet   pantoprazole (PROTONIX) 40 MG tablet   tiZANidine (ZANAFLEX) 4 MG capsule   triamcinolone cream (KENALOG) 0.1 %   Vitamin D, Ergocalciferol, (DRISDOL) 1.25 MG (50000 UNIT) CAPS capsule   No current  facility-administered medications for this encounter.    Jodell Cipro Ward, PA-C [facility]-Surgical Testing [phone]

## 2021-01-17 NOTE — Anesthesia Preprocedure Evaluation (Addendum)
Anesthesia Evaluation  Patient identified by MRN, date of birth, ID band Patient awake    Reviewed: Allergy & Precautions, NPO status , Patient's Chart, lab work & pertinent test results  Airway Mallampati: II  TM Distance: >3 FB Neck ROM: Full    Dental  (+) Missing, Implants,    Pulmonary sleep apnea , Current Smoker and Patient abstained from smoking.,    Pulmonary exam normal breath sounds clear to auscultation       Cardiovascular Exercise Tolerance: Good hypertension, Pt. on medications and Pt. on home beta blockers Normal cardiovascular exam Rhythm:Regular Rate:Normal     Neuro/Psych  Neuromuscular disease negative psych ROS   GI/Hepatic negative GI ROS, Neg liver ROS,   Endo/Other  negative endocrine ROS  Renal/GU Renal disease  negative genitourinary   Musculoskeletal  (+) Arthritis ,   Abdominal   Peds negative pediatric ROS (+)  Hematology negative hematology ROS (+)   Anesthesia Other Findings   Reproductive/Obstetrics negative OB ROS                           Anesthesia Physical Anesthesia Plan  ASA: 3  Anesthesia Plan: General   Post-op Pain Management:    Induction: Intravenous  PONV Risk Score and Plan: 1 and Treatment may vary due to age or medical condition and Ondansetron  Airway Management Planned: Oral ETT  Additional Equipment: None  Intra-op Plan:   Post-operative Plan: Extubation in OR  Informed Consent: I have reviewed the patients History and Physical, chart, labs and discussed the procedure including the risks, benefits and alternatives for the proposed anesthesia with the patient or authorized representative who has indicated his/her understanding and acceptance.     Dental advisory given  Plan Discussed with: CRNA and Anesthesiologist  Anesthesia Plan Comments:     Anesthesia Quick Evaluation

## 2021-01-24 ENCOUNTER — Other Ambulatory Visit: Payer: Self-pay | Admitting: Orthopedic Surgery

## 2021-01-24 LAB — SARS CORONAVIRUS 2 (TAT 6-24 HRS): SARS Coronavirus 2: NEGATIVE

## 2021-01-28 ENCOUNTER — Encounter (HOSPITAL_COMMUNITY)
Admission: RE | Disposition: A | Discharge status: Home / Self Care | Payer: Self-pay | Source: Ambulatory Visit | Attending: Orthopedic Surgery

## 2021-01-28 ENCOUNTER — Ambulatory Visit (HOSPITAL_COMMUNITY): Payer: Medicare Other | Admitting: Anesthesiology

## 2021-01-28 ENCOUNTER — Ambulatory Visit (HOSPITAL_COMMUNITY): Payer: Medicare Other | Admitting: Physician Assistant

## 2021-01-28 ENCOUNTER — Other Ambulatory Visit: Payer: Self-pay

## 2021-01-28 ENCOUNTER — Observation Stay (HOSPITAL_COMMUNITY)
Admission: RE | Admit: 2021-01-28 | Discharge: 2021-01-29 | Disposition: A | Discharge status: Home / Self Care | Payer: Medicare Other | Source: Ambulatory Visit | Attending: Orthopedic Surgery | Admitting: Orthopedic Surgery

## 2021-01-28 ENCOUNTER — Encounter (HOSPITAL_COMMUNITY): Payer: Self-pay | Admitting: Orthopedic Surgery

## 2021-01-28 DIAGNOSIS — M1711 Unilateral primary osteoarthritis, right knee: Secondary | ICD-10-CM | POA: Diagnosis not present

## 2021-01-28 DIAGNOSIS — R7303 Prediabetes: Secondary | ICD-10-CM | POA: Diagnosis not present

## 2021-01-28 DIAGNOSIS — F17210 Nicotine dependence, cigarettes, uncomplicated: Secondary | ICD-10-CM | POA: Diagnosis not present

## 2021-01-28 DIAGNOSIS — M179 Osteoarthritis of knee, unspecified: Secondary | ICD-10-CM

## 2021-01-28 DIAGNOSIS — I10 Essential (primary) hypertension: Secondary | ICD-10-CM | POA: Insufficient documentation

## 2021-01-28 DIAGNOSIS — Z79899 Other long term (current) drug therapy: Secondary | ICD-10-CM | POA: Insufficient documentation

## 2021-01-28 HISTORY — PX: TOTAL KNEE ARTHROPLASTY: SHX125

## 2021-01-28 LAB — GLUCOSE, CAPILLARY: Glucose-Capillary: 132 mg/dL — ABNORMAL HIGH (ref 70–99)

## 2021-01-28 SURGERY — ARTHROPLASTY, KNEE, TOTAL
Anesthesia: General | Site: Knee | Laterality: Right

## 2021-01-28 MED ORDER — AMISULPRIDE (ANTIEMETIC) 5 MG/2ML IV SOLN: 10.0000 mg | Freq: Once | INTRAVENOUS | Status: DC | PRN

## 2021-01-28 MED ORDER — ROCURONIUM BROMIDE 10 MG/ML (PF) SYRINGE
PREFILLED_SYRINGE | INTRAVENOUS | Status: DC | PRN
Start: 2021-01-28 — End: 2021-01-28
  Administered 2021-01-28: 80 mg via INTRAVENOUS

## 2021-01-28 MED ORDER — DEXAMETHASONE SODIUM PHOSPHATE 10 MG/ML IJ SOLN
INTRAMUSCULAR | Status: AC
  Filled 2021-01-28: qty 1

## 2021-01-28 MED ORDER — SUGAMMADEX SODIUM 500 MG/5ML IV SOLN
INTRAVENOUS | Status: DC | PRN
  Administered 2021-01-28: 200 mg via INTRAVENOUS

## 2021-01-28 MED ORDER — LACTATED RINGERS IV SOLN: INTRAVENOUS | Status: DC

## 2021-01-28 MED ORDER — BUPIVACAINE LIPOSOME 1.3 % IJ SUSP: 20.0000 mL | Freq: Once | INTRAMUSCULAR | Status: DC

## 2021-01-28 MED ORDER — INSULIN ASPART 100 UNIT/ML IJ SOLN
0.0000 [IU] | Freq: Three times a day (TID) | INTRAMUSCULAR | Status: DC
  Administered 2021-01-28 – 2021-01-29 (×3): 2 [IU] via SUBCUTANEOUS

## 2021-01-28 MED ORDER — FENTANYL CITRATE (PF) 100 MCG/2ML IJ SOLN
INTRAMUSCULAR | Status: AC
  Filled 2021-01-28: qty 2

## 2021-01-28 MED ORDER — SODIUM CHLORIDE (PF) 0.9 % IJ SOLN
INTRAMUSCULAR | Status: DC | PRN
  Administered 2021-01-28: 60 mL

## 2021-01-28 MED ORDER — DOCUSATE SODIUM 100 MG PO CAPS
100.0000 mg | ORAL_CAPSULE | Freq: Two times a day (BID) | ORAL | Status: DC
  Administered 2021-01-28 – 2021-01-29 (×2): 100 mg via ORAL
  Filled 2021-01-28 (×2): qty 1

## 2021-01-28 MED ORDER — MIDAZOLAM HCL 5 MG/5ML IJ SOLN
INTRAMUSCULAR | Status: DC | PRN
  Administered 2021-01-28: 2 mg via INTRAVENOUS

## 2021-01-28 MED ORDER — ACETAMINOPHEN 500 MG PO TABS
1000.0000 mg | ORAL_TABLET | Freq: Four times a day (QID) | ORAL | Status: DC
  Administered 2021-01-29 (×3): 1000 mg via ORAL
  Filled 2021-01-28 (×4): qty 2

## 2021-01-28 MED ORDER — HYDROMORPHONE HCL 1 MG/ML IJ SOLN
INTRAMUSCULAR | Status: AC
  Filled 2021-01-28: qty 1

## 2021-01-28 MED ORDER — ACETAMINOPHEN 10 MG/ML IV SOLN
1000.0000 mg | Freq: Once | INTRAVENOUS | Status: AC
  Administered 2021-01-28: 1000 mg via INTRAVENOUS
  Filled 2021-01-28: qty 100

## 2021-01-28 MED ORDER — CEFAZOLIN SODIUM-DEXTROSE 2-4 GM/100ML-% IV SOLN
2.0000 g | INTRAVENOUS | Status: AC
  Administered 2021-01-28: 2 g via INTRAVENOUS
  Filled 2021-01-28: qty 100

## 2021-01-28 MED ORDER — HYDROMORPHONE HCL 1 MG/ML IJ SOLN
0.2500 mg | INTRAMUSCULAR | Status: DC | PRN
  Administered 2021-01-28 (×2): 0.5 mg via INTRAVENOUS

## 2021-01-28 MED ORDER — SODIUM CHLORIDE (PF) 0.9 % IJ SOLN
INTRAMUSCULAR | Status: AC
  Filled 2021-01-28: qty 10

## 2021-01-28 MED ORDER — FENTANYL CITRATE (PF) 100 MCG/2ML IJ SOLN
INTRAMUSCULAR | Status: DC | PRN
  Administered 2021-01-28: 50 ug via INTRAVENOUS
  Administered 2021-01-28: 75 ug via INTRAVENOUS
  Administered 2021-01-28: 100 ug via INTRAVENOUS

## 2021-01-28 MED ORDER — BUPIVACAINE LIPOSOME 1.3 % IJ SUSP
INTRAMUSCULAR | Status: AC
  Filled 2021-01-28: qty 20

## 2021-01-28 MED ORDER — FENTANYL CITRATE PF 50 MCG/ML IJ SOSY
50.0000 ug | PREFILLED_SYRINGE | INTRAMUSCULAR | Status: DC
Start: 2021-01-28 — End: 2021-01-28
  Administered 2021-01-28: 50 ug via INTRAVENOUS
  Filled 2021-01-28: qty 2

## 2021-01-28 MED ORDER — LIRAGLUTIDE 18 MG/3ML ~~LOC~~ SOPN
1.8000 mg | PEN_INJECTOR | Freq: Every day | SUBCUTANEOUS | Status: DC
Start: 2021-01-29 — End: 2021-01-29

## 2021-01-28 MED ORDER — ACETAMINOPHEN 500 MG PO TABS: 1000.0000 mg | ORAL_TABLET | Freq: Once | ORAL | Status: DC

## 2021-01-28 MED ORDER — MORPHINE SULFATE (PF) 2 MG/ML IV SOLN
0.5000 mg | INTRAVENOUS | Status: DC | PRN
Start: 2021-01-28 — End: 2021-01-29
  Administered 2021-01-28: 1 mg via INTRAVENOUS
  Filled 2021-01-28: qty 1

## 2021-01-28 MED ORDER — CHLORHEXIDINE GLUCONATE 0.12 % MT SOLN
15.0000 mL | Freq: Once | OROMUCOSAL | Status: AC
  Administered 2021-01-28: 15 mL via OROMUCOSAL

## 2021-01-28 MED ORDER — FENTANYL CITRATE PF 50 MCG/ML IJ SOSY
25.0000 ug | PREFILLED_SYRINGE | INTRAMUSCULAR | Status: DC | PRN
  Administered 2021-01-28 (×3): 50 ug via INTRAVENOUS

## 2021-01-28 MED ORDER — FENTANYL CITRATE (PF) 250 MCG/5ML IJ SOLN
INTRAMUSCULAR | Status: AC
  Filled 2021-01-28: qty 5

## 2021-01-28 MED ORDER — OXYCODONE HCL 5 MG PO TABS
10.0000 mg | ORAL_TABLET | ORAL | Status: DC | PRN
  Administered 2021-01-28: 10 mg via ORAL
  Administered 2021-01-29 (×3): 15 mg via ORAL
  Filled 2021-01-28 (×3): qty 3

## 2021-01-28 MED ORDER — OXYCODONE HCL 5 MG PO TABS
5.0000 mg | ORAL_TABLET | ORAL | Status: DC | PRN
  Administered 2021-01-28: 5 mg via ORAL
  Filled 2021-01-28: qty 1
  Filled 2021-01-28: qty 2

## 2021-01-28 MED ORDER — DIPHENHYDRAMINE HCL 12.5 MG/5ML PO ELIX: 12.5000 mg | ORAL_SOLUTION | ORAL | Status: DC | PRN

## 2021-01-28 MED ORDER — MENTHOL 3 MG MT LOZG: 1.0000 | LOZENGE | OROMUCOSAL | Status: DC | PRN

## 2021-01-28 MED ORDER — AMLODIPINE BESYLATE 10 MG PO TABS
10.0000 mg | ORAL_TABLET | Freq: Every day | ORAL | Status: DC
  Administered 2021-01-29: 10 mg via ORAL
  Filled 2021-01-28: qty 1

## 2021-01-28 MED ORDER — PROPOFOL 10 MG/ML IV BOLUS
INTRAVENOUS | Status: DC | PRN
  Administered 2021-01-28: 150 mg via INTRAVENOUS

## 2021-01-28 MED ORDER — RIVAROXABAN 10 MG PO TABS
10.0000 mg | ORAL_TABLET | Freq: Every day | ORAL | Status: DC
  Administered 2021-01-29: 10 mg via ORAL
  Filled 2021-01-28: qty 1

## 2021-01-28 MED ORDER — FENTANYL CITRATE PF 50 MCG/ML IJ SOSY
PREFILLED_SYRINGE | INTRAMUSCULAR | Status: AC
  Filled 2021-01-28: qty 2

## 2021-01-28 MED ORDER — PHENYLEPHRINE 40 MCG/ML (10ML) SYRINGE FOR IV PUSH (FOR BLOOD PRESSURE SUPPORT)
PREFILLED_SYRINGE | INTRAVENOUS | Status: AC
  Filled 2021-01-28: qty 10

## 2021-01-28 MED ORDER — SODIUM CHLORIDE 0.9 % IR SOLN
Status: DC | PRN
  Administered 2021-01-28: 1000 mL

## 2021-01-28 MED ORDER — ONDANSETRON HCL 4 MG PO TABS: 4.0000 mg | ORAL_TABLET | Freq: Four times a day (QID) | ORAL | Status: DC | PRN

## 2021-01-28 MED ORDER — OXYCODONE HCL 5 MG/5ML PO SOLN: 5.0000 mg | Freq: Once | ORAL | Status: AC | PRN

## 2021-01-28 MED ORDER — DEXAMETHASONE SODIUM PHOSPHATE 10 MG/ML IJ SOLN
8.0000 mg | Freq: Once | INTRAMUSCULAR | Status: AC
  Administered 2021-01-28: 8 mg via INTRAVENOUS

## 2021-01-28 MED ORDER — LOSARTAN POTASSIUM 50 MG PO TABS
100.0000 mg | ORAL_TABLET | Freq: Every day | ORAL | Status: DC
  Administered 2021-01-29: 100 mg via ORAL
  Filled 2021-01-28: qty 2

## 2021-01-28 MED ORDER — GABAPENTIN 400 MG PO CAPS
800.0000 mg | ORAL_CAPSULE | Freq: Four times a day (QID) | ORAL | Status: DC
  Administered 2021-01-28 – 2021-01-29 (×3): 800 mg via ORAL
  Filled 2021-01-28 (×3): qty 2

## 2021-01-28 MED ORDER — GABAPENTIN 400 MG PO CAPS
800.0000 mg | ORAL_CAPSULE | Freq: Four times a day (QID) | ORAL | Status: DC
  Filled 2021-01-28 (×2): qty 2

## 2021-01-28 MED ORDER — OXYCODONE HCL 5 MG PO TABS
ORAL_TABLET | ORAL | Status: AC
  Filled 2021-01-28: qty 1

## 2021-01-28 MED ORDER — PRAVASTATIN SODIUM 20 MG PO TABS: 40.0000 mg | ORAL_TABLET | Freq: Every day | ORAL | Status: DC

## 2021-01-28 MED ORDER — METOCLOPRAMIDE HCL 5 MG PO TABS: 5.0000 mg | ORAL_TABLET | Freq: Three times a day (TID) | ORAL | Status: DC | PRN

## 2021-01-28 MED ORDER — BUPIVACAINE HCL (PF) 0.5 % IJ SOLN
INTRAMUSCULAR | Status: DC | PRN
  Administered 2021-01-28: 30 mL

## 2021-01-28 MED ORDER — MIDAZOLAM HCL 2 MG/2ML IJ SOLN
INTRAMUSCULAR | Status: AC
  Filled 2021-01-28: qty 2

## 2021-01-28 MED ORDER — PHENYLEPHRINE 40 MCG/ML (10ML) SYRINGE FOR IV PUSH (FOR BLOOD PRESSURE SUPPORT)
PREFILLED_SYRINGE | INTRAVENOUS | Status: DC | PRN
  Administered 2021-01-28: 80 ug via INTRAVENOUS
  Administered 2021-01-28 (×2): 120 ug via INTRAVENOUS

## 2021-01-28 MED ORDER — FENTANYL CITRATE PF 50 MCG/ML IJ SOSY
PREFILLED_SYRINGE | INTRAMUSCULAR | Status: AC
  Filled 2021-01-28: qty 1

## 2021-01-28 MED ORDER — ONDANSETRON HCL 4 MG/2ML IJ SOLN: 4.0000 mg | Freq: Four times a day (QID) | INTRAMUSCULAR | Status: DC | PRN

## 2021-01-28 MED ORDER — MIDAZOLAM HCL 2 MG/2ML IJ SOLN
1.0000 mg | INTRAMUSCULAR | Status: DC
  Administered 2021-01-28: 1 mg via INTRAVENOUS
  Filled 2021-01-28: qty 2

## 2021-01-28 MED ORDER — INSULIN ASPART 100 UNIT/ML IJ SOLN: 0.0000 [IU] | Freq: Every day | INTRAMUSCULAR | Status: DC

## 2021-01-28 MED ORDER — EPHEDRINE SULFATE-NACL 50-0.9 MG/10ML-% IV SOSY
PREFILLED_SYRINGE | INTRAVENOUS | Status: DC | PRN
  Administered 2021-01-28: 10 mg via INTRAVENOUS
  Administered 2021-01-28: 5 mg via INTRAVENOUS
  Administered 2021-01-28: 10 mg via INTRAVENOUS

## 2021-01-28 MED ORDER — PROPOFOL 10 MG/ML IV BOLUS
INTRAVENOUS | Status: AC
  Filled 2021-01-28: qty 20

## 2021-01-28 MED ORDER — DULOXETINE HCL 30 MG PO CPEP
30.0000 mg | ORAL_CAPSULE | Freq: Three times a day (TID) | ORAL | Status: DC
  Administered 2021-01-28 – 2021-01-29 (×2): 30 mg via ORAL
  Filled 2021-01-28 (×2): qty 1

## 2021-01-28 MED ORDER — TRANEXAMIC ACID-NACL 1000-0.7 MG/100ML-% IV SOLN
1000.0000 mg | INTRAVENOUS | Status: AC
  Administered 2021-01-28: 1000 mg via INTRAVENOUS
  Filled 2021-01-28: qty 100

## 2021-01-28 MED ORDER — CEFAZOLIN SODIUM-DEXTROSE 2-4 GM/100ML-% IV SOLN
2.0000 g | Freq: Four times a day (QID) | INTRAVENOUS | Status: AC
  Administered 2021-01-28 – 2021-01-29 (×2): 2 g via INTRAVENOUS
  Filled 2021-01-28 (×2): qty 100

## 2021-01-28 MED ORDER — ORAL CARE MOUTH RINSE: 15.0000 mL | Freq: Once | OROMUCOSAL | Status: AC

## 2021-01-28 MED ORDER — PROMETHAZINE HCL 25 MG/ML IJ SOLN: 6.2500 mg | INTRAMUSCULAR | Status: DC | PRN

## 2021-01-28 MED ORDER — CARVEDILOL 25 MG PO TABS
25.0000 mg | ORAL_TABLET | Freq: Two times a day (BID) | ORAL | Status: DC
  Administered 2021-01-28 – 2021-01-29 (×2): 25 mg via ORAL
  Filled 2021-01-28 (×2): qty 1

## 2021-01-28 MED ORDER — 0.9 % SODIUM CHLORIDE (POUR BTL) OPTIME
TOPICAL | Status: DC | PRN
  Administered 2021-01-28: 1000 mL

## 2021-01-28 MED ORDER — ONDANSETRON HCL 4 MG/2ML IJ SOLN
INTRAMUSCULAR | Status: DC | PRN
  Administered 2021-01-28: 4 mg via INTRAVENOUS

## 2021-01-28 MED ORDER — POLYETHYLENE GLYCOL 3350 17 G PO PACK: 17.0000 g | PACK | Freq: Every day | ORAL | Status: DC | PRN

## 2021-01-28 MED ORDER — BISACODYL 10 MG RE SUPP: 10.0000 mg | Freq: Every day | RECTAL | Status: DC | PRN

## 2021-01-28 MED ORDER — POVIDONE-IODINE 10 % EX SWAB
2.0000 "application " | Freq: Once | CUTANEOUS | Status: AC
  Administered 2021-01-28: 2 via TOPICAL

## 2021-01-28 MED ORDER — PANTOPRAZOLE SODIUM 40 MG PO TBEC
40.0000 mg | DELAYED_RELEASE_TABLET | Freq: Every day | ORAL | Status: DC
  Administered 2021-01-28 – 2021-01-29 (×2): 40 mg via ORAL
  Filled 2021-01-28 (×2): qty 1

## 2021-01-28 MED ORDER — PHENOL 1.4 % MT LIQD: 1.0000 | OROMUCOSAL | Status: DC | PRN

## 2021-01-28 MED ORDER — TIZANIDINE HCL 4 MG PO TABS
4.0000 mg | ORAL_TABLET | Freq: Four times a day (QID) | ORAL | Status: DC | PRN
  Administered 2021-01-28 – 2021-01-29 (×3): 4 mg via ORAL
  Filled 2021-01-28 (×3): qty 1

## 2021-01-28 MED ORDER — OXYCODONE HCL 5 MG PO TABS
5.0000 mg | ORAL_TABLET | Freq: Once | ORAL | Status: AC | PRN
  Administered 2021-01-28: 5 mg via ORAL

## 2021-01-28 MED ORDER — METOCLOPRAMIDE HCL 5 MG/ML IJ SOLN: 5.0000 mg | Freq: Three times a day (TID) | INTRAMUSCULAR | Status: DC | PRN

## 2021-01-28 MED ORDER — HYDROCHLOROTHIAZIDE 25 MG PO TABS
25.0000 mg | ORAL_TABLET | Freq: Every day | ORAL | Status: DC
  Administered 2021-01-29: 25 mg via ORAL
  Filled 2021-01-28: qty 1

## 2021-01-28 MED ORDER — FLEET ENEMA 7-19 GM/118ML RE ENEM: 1.0000 | ENEMA | Freq: Once | RECTAL | Status: DC | PRN

## 2021-01-28 MED ORDER — LIDOCAINE 2% (20 MG/ML) 5 ML SYRINGE
INTRAMUSCULAR | Status: DC | PRN
  Administered 2021-01-28: 60 mg via INTRAVENOUS

## 2021-01-28 MED ORDER — STERILE WATER FOR IRRIGATION IR SOLN
Status: DC | PRN
  Administered 2021-01-28: 2000 mL

## 2021-01-28 MED ORDER — ONDANSETRON HCL 4 MG/2ML IJ SOLN
INTRAMUSCULAR | Status: AC
  Filled 2021-01-28: qty 2

## 2021-01-28 MED ORDER — SODIUM CHLORIDE 0.9 % IV SOLN: INTRAVENOUS | Status: DC

## 2021-01-28 MED ORDER — ACETAMINOPHEN 10 MG/ML IV SOLN
1000.0000 mg | Freq: Four times a day (QID) | INTRAVENOUS | Status: DC
Start: 2021-01-28 — End: 2021-01-28

## 2021-01-28 MED ORDER — BUPIVACAINE LIPOSOME 1.3 % IJ SUSP
INTRAMUSCULAR | Status: DC | PRN
  Administered 2021-01-28: 20 mL

## 2021-01-28 SURGICAL SUPPLY — 59 items
ATTUNE MED DOME PAT 38 KNEE (Knees) ×1 IMPLANT
ATTUNE MED DOME PAT 38MM KNEE (Knees) ×1 IMPLANT
ATTUNE PS FEM RT SZ 6 CEM KNEE (Femur) ×2 IMPLANT
ATTUNE PSRP INSR SZ6 10 KNEE (Insert) ×1 IMPLANT
ATTUNE PSRP INSR SZ6 10MM KNEE (Insert) ×1 IMPLANT
BAG COUNTER SPONGE SURGICOUNT (BAG) IMPLANT
BAG SPEC THK2 15X12 ZIP CLS (MISCELLANEOUS) ×1
BAG SPNG CNTER NS LX DISP (BAG)
BAG SURGICOUNT SPONGE COUNTING (BAG)
BAG ZIPLOCK 12X15 (MISCELLANEOUS) ×3 IMPLANT
BASE TIBIA ATTUNE KNEE SYS SZ6 (Knees) IMPLANT
BLADE SAG 18X100X1.27 (BLADE) ×3 IMPLANT
BLADE SAW SGTL 11.0X1.19X90.0M (BLADE) ×3 IMPLANT
BNDG ELASTIC 6X5.8 VLCR STR LF (GAUZE/BANDAGES/DRESSINGS) ×3 IMPLANT
BOWL SMART MIX CTS (DISPOSABLE) ×3 IMPLANT
BSPLAT TIB 6 CMNT ROT PLAT STR (Knees) ×1 IMPLANT
CEMENT BONE SIMPLEX SPEEDSET (Cement) ×4 IMPLANT
CLOSURE STERI-STRIP 1/2X4 (GAUZE/BANDAGES/DRESSINGS) ×1
CLOSURE WOUND 1/2 X4 (GAUZE/BANDAGES/DRESSINGS) ×2
CLSR STERI-STRIP ANTIMIC 1/2X4 (GAUZE/BANDAGES/DRESSINGS) ×1 IMPLANT
COVER SURGICAL LIGHT HANDLE (MISCELLANEOUS) ×3 IMPLANT
CUFF TOURN SGL QUICK 34 (TOURNIQUET CUFF) ×3
CUFF TRNQT CYL 34X4.125X (TOURNIQUET CUFF) ×1 IMPLANT
DECANTER SPIKE VIAL GLASS SM (MISCELLANEOUS) ×3 IMPLANT
DRAPE INCISE IOBAN 66X45 STRL (DRAPES) ×3 IMPLANT
DRAPE U-SHAPE 47X51 STRL (DRAPES) ×3 IMPLANT
DRSG AQUACEL AG ADV 3.5X10 (GAUZE/BANDAGES/DRESSINGS) ×3 IMPLANT
DURAPREP 26ML APPLICATOR (WOUND CARE) ×3 IMPLANT
ELECT REM PT RETURN 15FT ADLT (MISCELLANEOUS) ×3 IMPLANT
GLOVE SRG 8 PF TXTR STRL LF DI (GLOVE) ×1 IMPLANT
GLOVE SURG ENC MOIS LTX SZ6.5 (GLOVE) ×3 IMPLANT
GLOVE SURG ENC MOIS LTX SZ8 (GLOVE) ×6 IMPLANT
GLOVE SURG UNDER POLY LF SZ7 (GLOVE) ×3 IMPLANT
GLOVE SURG UNDER POLY LF SZ8 (GLOVE) ×3
GLOVE SURG UNDER POLY LF SZ8.5 (GLOVE) ×3 IMPLANT
GOWN STRL REUS W/TWL LRG LVL3 (GOWN DISPOSABLE) ×6 IMPLANT
GOWN STRL REUS W/TWL XL LVL3 (GOWN DISPOSABLE) ×3 IMPLANT
HANDPIECE INTERPULSE COAX TIP (DISPOSABLE) ×3
HOLDER FOLEY CATH W/STRAP (MISCELLANEOUS) IMPLANT
IMMOBILIZER KNEE 20 (SOFTGOODS) ×3
IMMOBILIZER KNEE 20 THIGH 36 (SOFTGOODS) ×1 IMPLANT
KIT TURNOVER KIT A (KITS) IMPLANT
MANIFOLD NEPTUNE II (INSTRUMENTS) ×3 IMPLANT
NS IRRIG 1000ML POUR BTL (IV SOLUTION) ×3 IMPLANT
PACK TOTAL KNEE CUSTOM (KITS) ×3 IMPLANT
PADDING CAST COTTON 6X4 STRL (CAST SUPPLIES) ×4 IMPLANT
PROTECTOR NERVE ULNAR (MISCELLANEOUS) ×3 IMPLANT
SET HNDPC FAN SPRY TIP SCT (DISPOSABLE) ×1 IMPLANT
STRIP CLOSURE SKIN 1/2X4 (GAUZE/BANDAGES/DRESSINGS) ×4 IMPLANT
SUT MNCRL AB 4-0 PS2 18 (SUTURE) ×3 IMPLANT
SUT STRATAFIX 0 PDS 27 VIOLET (SUTURE) ×3
SUT VIC AB 2-0 CT1 27 (SUTURE) ×9
SUT VIC AB 2-0 CT1 TAPERPNT 27 (SUTURE) ×3 IMPLANT
SUTURE STRATFX 0 PDS 27 VIOLET (SUTURE) ×1 IMPLANT
TIBIA ATTUNE KNEE SYS BASE SZ6 (Knees) ×3 IMPLANT
TRAY FOLEY MTR SLVR 16FR STAT (SET/KITS/TRAYS/PACK) ×3 IMPLANT
TUBE SUCTION HIGH CAP CLEAR NV (SUCTIONS) ×3 IMPLANT
WATER STERILE IRR 1000ML POUR (IV SOLUTION) ×6 IMPLANT
WRAP KNEE MAXI GEL POST OP (GAUZE/BANDAGES/DRESSINGS) ×3 IMPLANT

## 2021-01-28 NOTE — Anesthesia Procedure Notes (Signed)
Procedure Name: Intubation Date/Time: 01/28/2021 12:13 PM Performed by: Nelle Don, CRNA Pre-anesthesia Checklist: Patient identified, Emergency Drugs available, Suction available and Patient being monitored Patient Re-evaluated:Patient Re-evaluated prior to induction Oxygen Delivery Method: Circle system utilized Preoxygenation: Pre-oxygenation with 100% oxygen Induction Type: IV induction Ventilation: Mask ventilation without difficulty and Oral airway inserted - appropriate to patient size Laryngoscope Size: Glidescope Grade View: Grade I Tube type: Oral Tube size: 7.5 mm Number of attempts: 1 Airway Equipment and Method: Stylet and Video-laryngoscopy Placement Confirmation: ETT inserted through vocal cords under direct vision, positive ETCO2 and breath sounds checked- equal and bilateral Secured at: 23 cm Tube secured with: Tape Dental Injury: Teeth and Oropharynx as per pre-operative assessment  Comments: Elective glidescope given limited ROM

## 2021-01-28 NOTE — Progress Notes (Signed)
Pt states he does not wear CPAP at home and doesn't want one here.

## 2021-01-28 NOTE — Interval H&P Note (Signed)
History and Physical Interval Note:  01/28/2021 9:46 AM  Christian [name] Christian [name]  has presented today for surgery, with the diagnosis of right knee osteoarthritis.  The various methods of treatment have been discussed with the patient and family. After consideration of risks, benefits and other options for treatment, the patient has consented to  Procedure(s): TOTAL KNEE ARTHROPLASTY (Right) as a surgical intervention.  The patient's history has been reviewed, patient examined, no change in status, stable for surgery.  I have reviewed the patient's chart and labs.  Questions were answered to the patient's satisfaction.     Homero Fellers Marsha Gundlach

## 2021-01-28 NOTE — Transfer of Care (Signed)
Immediate Anesthesia Transfer of Care Note  Patient: Christian [name]  Procedure(s) Performed: TOTAL KNEE ARTHROPLASTY (Right: Knee)  Patient Location: PACU  Anesthesia Type:General  Level of Consciousness: awake, alert , oriented and patient cooperative  Airway & Oxygen Therapy: Patient Spontanous Breathing and Patient connected to face mask oxygen  Post-op Assessment: Report given to RN, Post -op Vital signs reviewed and stable and Patient moving all extremities X 4  Post vital signs: stable  Last Vitals:  Vitals Value Taken Time  BP 153/88 01/28/21 1348  Temp 36.9 C 01/28/21 1345  Pulse 65 01/28/21 1353  Resp 15 01/28/21 1353  SpO2 100 % 01/28/21 1353  Vitals shown include unvalidated device data.  Last Pain:  Vitals:   01/28/21 0922  TempSrc:   PainSc: 0-No pain      Patients Stated Pain Goal: 4 (01/28/21 3295)  Complications: No notable events documented.

## 2021-01-28 NOTE — Progress Notes (Signed)
Assisted Dr. Bass with right, ultrasound guided, adductor canal block. Side rails up, monitors on throughout procedure. See vital signs in flow sheet. Tolerated Procedure well.  

## 2021-01-28 NOTE — Op Note (Signed)
OPERATIVE REPORT-TOTAL KNEE ARTHROPLASTY   Pre-operative diagnosis- Osteoarthritis  Right knee(s)  Post-operative diagnosis- Osteoarthritis Right knee(s)  Procedure-  Right  Total Knee Arthroplasty  Surgeon- Dione Plover. Shelli Portilla, MD  Assistant- Molli Barrows, PA-C   Anesthesia-  GA combined with regional for post-op pain  EBL- 25 ml   Drains None  Tourniquet time-  Total Tourniquet Time Documented: Thigh (Right) - 36 minutes Total: Thigh (Right) - 36 minutes     Complications- None  Condition-PACU - hemodynamically stable.   Brief Clinical Note  Christian [name] is a 60 y.o. year old male with end stage OA of his right knee with progressively worsening pain and dysfunction. He has constant pain, with activity and at rest and significant functional deficits with difficulties even with ADLs. He has had extensive non-op management including analgesics, injections of cortisone, and home exercise program, but remains in significant pain with significant dysfunction. Radiographs show bone on bone arthritis medial and patellofemoral. He presents now for right Total Knee Arthroplasty.     Procedure in detail---   The patient is brought into the operating room and positioned supine on the operating table. After successful administration of  GA combined with regional for post-op pain,   a tourniquet is placed high on the  Right thigh(s) and the lower extremity is prepped and draped in the usual sterile fashion. Time out is performed by the operating team and then the  Right lower extremity is wrapped in Esmarch, knee flexed and the tourniquet inflated to 300 mmHg.       A midline incision is made with a ten blade through the subcutaneous tissue to the level of the extensor mechanism. A fresh blade is used to make a medial parapatellar arthrotomy. Soft tissue over the proximal medial tibia is subperiosteally elevated to the joint line with a knife and into the semimembranosus bursa with a Cobb  elevator. Soft tissue over the proximal lateral tibia is elevated with attention being paid to avoiding the patellar tendon on the tibial tubercle. The patella is everted, knee flexed 90 degrees and the ACL and PCL are removed. Findings are bone on bone medial and patellofemoral with massive global osteophytes and large calcified loose bodies.        The drill is used to create a starting hole in the distal femur and the canal is thoroughly irrigated with sterile saline to remove the fatty contents. The 5 degree Right  valgus alignment guide is placed into the femoral canal and the distal femoral cutting block is pinned to remove 9 mm off the distal femur. Resection is made with an oscillating saw.      The tibia is subluxed forward and the menisci are removed. The extramedullary alignment guide is placed referencing proximally at the medial aspect of the tibial tubercle and distally along the second metatarsal axis and tibial crest. The block is pinned to remove 7mm off the more deficient medial  side. Resection is made with an oscillating saw. Size 6is the most appropriate size for the tibia and the proximal tibia is prepared with the modular drill and keel punch for that size.      The femoral sizing guide is placed and size 6 is most appropriate. Rotation is marked off the epicondylar axis and confirmed by creating a rectangular flexion gap at 90 degrees. The size 6 cutting block is pinned in this rotation and the anterior, posterior and chamfer cuts are made with the oscillating saw. The intercondylar block  is then placed and that cut is made.      Trial size 6 tibial component, trial size 6 posterior stabilized femur and a 10  mm posterior stabilized rotating platform insert trial is placed. Full extension is achieved with excellent varus/valgus and anterior/posterior balance throughout full range of motion. The patella is everted and thickness measured to be 25  mm. Free hand resection is taken to 15 mm,  a 38 template is placed, lug holes are drilled, trial patella is placed, and it tracks normally. Osteophytes are removed off the posterior femur with the trial in place. All trials are removed and the cut bone surfaces prepared with pulsatile lavage. Cement is mixed and once ready for implantation, the size 6 tibial implant, size  6 posterior stabilized femoral component, and the size 38 patella are cemented in place and the patella is held with the clamp. The trial insert is placed and the knee held in full extension. The Exparel (20 ml mixed with 60 ml saline) is injected into the extensor mechanism, posterior capsule, medial and lateral gutters and subcutaneous tissues.  All extruded cement is removed and once the cement is hard the permanent 10 mm posterior stabilized rotating platform insert is placed into the tibial tray.      The wound is copiously irrigated with saline solution and the extensor mechanism closed with # 0 Stratofix suture. The tourniquet is released for a total tourniquet time of 36  minutes. Flexion against gravity is 140 degrees and the patella tracks normally. Subcutaneous tissue is closed with 2.0 vicryl and subcuticular with running 4.0 Monocryl. The incision is cleaned and dried and steri-strips and a bulky sterile dressing are applied. The limb is placed into a knee immobilizer and the patient is awakened and transported to recovery in stable condition.      Please note that a surgical assistant was a medical necessity for this procedure in order to perform it in a safe and expeditious manner. Surgical assistant was necessary to retract the ligaments and vital neurovascular structures to prevent injury to them and also necessary for proper positioning of the limb to allow for anatomic placement of the prosthesis.   Christian Rankin Erza Mothershead, MD    01/28/2021, 1:13 PM

## 2021-01-28 NOTE — Anesthesia Postprocedure Evaluation (Signed)
Anesthesia Post Note  Patient: ROMEN YUTZY  Procedure(s) Performed: TOTAL KNEE ARTHROPLASTY (Right: Knee)     Patient location during evaluation: PACU Anesthesia Type: General Level of consciousness: awake and alert Pain management: pain level controlled Vital Signs Assessment: post-procedure vital signs reviewed and stable Respiratory status: spontaneous breathing, nonlabored ventilation, respiratory function stable and patient connected to nasal cannula oxygen Cardiovascular status: blood pressure returned to baseline and stable Postop Assessment: no apparent nausea or vomiting Anesthetic complications: no   No notable events documented.  Last Vitals:  Vitals:   01/28/21 1450 01/28/21 1500  BP:  139/87  Pulse: 67 61  Resp: 13 12  Temp:    SpO2: 100% 99%    Last Pain:  Vitals:   01/28/21 1500  TempSrc:   PainSc: 7                  Candra R Kruze Atchley

## 2021-01-28 NOTE — Evaluation (Signed)
Physical Therapy Evaluation Patient Details Name: Christian [name] MRN: [id number] DOB: [birth year] Today's Date: 01/28/2021  History of Present Illness  Patient is 60 y.o. male s/p Rt TKA on 01/28/21 with PMH significant for OA, HTN, HLD, back pain, ACDF C3-7.  Clinical Impression  Christian [name] is a 60 y.o. male POD 0 s/p Rt TKA. Patient reports independence with mobility at baseline. Patient is now limited by functional impairments (see PT problem list below) and requires min assist for transfers and gait with RW. Patient was able to ambulate ~10 feet with RW and min assist, distance limited due to pain. Patient instructed in exercise to facilitate circulation to manage edema and reduce risk of DVT. Patient will benefit from continued skilled PT interventions to address impairments and progress towards PLOF. Acute PT will follow to progress mobility and stair training in preparation for safe discharge home.        Recommendations for follow up therapy are one component of a multi-disciplinary discharge planning process, led by the attending physician.  Recommendations may be updated based on patient status, additional functional criteria and insurance authorization.  Follow Up Recommendations Follow physician's recommendations for discharge plan and follow up therapies    Assistance Recommended at Discharge Frequent or constant Supervision/Assistance  Functional Status Assessment Patient has had a recent decline in their functional status and demonstrates the ability to make significant improvements in function in a reasonable and predictable amount of time.  Equipment Recommendations  None recommended by PT    Recommendations for Other Services       Precautions / Restrictions Precautions Precautions: Fall Restrictions Weight Bearing Restrictions: No Other Position/Activity Restrictions: WBAT      Mobility  Bed Mobility Overal bed mobility: Needs Assistance Bed Mobility: Supine  to Sit     Supine to sit: Min assist;HOB elevated     General bed mobility comments: cues to use bed rail, assist to fully pivot and raise trunk.    Transfers Overall transfer level: Needs assistance Equipment used: Rolling walker (2 wheels) Transfers: Sit to/from Stand Sit to Stand: Min assist;From elevated surface           General transfer comment: cues for hand placement for power up with single UE at EOB. Assist to rise and steady once standing.    Ambulation/Gait Ambulation/Gait assistance: Min assist Gait Distance (Feet): 10 Feet Assistive device: Rolling walker (2 wheels) Gait Pattern/deviations: Step-to pattern;Decreased stride length;Decreased weight shift to right;Antalgic Gait velocity: decr     General Gait Details: cues to maintain safe proximity to RW and assist to advance safe distance. cues for step to pattern. pt with good use of UE's and reduced WB on Rt knee due to pain.  Stairs            Wheelchair Mobility    Modified Rankin (Stroke Patients Only)       Balance Overall balance assessment: Needs assistance Sitting-balance support: Feet supported Sitting balance-Leahy Scale: Good     Standing balance support: Reliant on assistive device for balance;During functional activity;Bilateral upper extremity supported Standing balance-Leahy Scale: Poor                               Pertinent Vitals/Pain Pain Assessment: 0-10 Pain Score: 6  Pain Location: Rt knee Pain Descriptors / Indicators: Aching;Discomfort;Grimacing Pain Intervention(s): Limited activity within patient's tolerance;Monitored during session;Repositioned;Ice applied    Home Living Family/patient expects to be discharged to::  Private residence Living Arrangements: Spouse/significant other Available Help at Discharge: Family Type of Home: House Home Access: Stairs to enter Entrance Stairs-Rails: None Entrance Stairs-Number of Steps: 1+1/2 Alternate Level  Stairs-Number of Steps: 14 Home Layout: Two level;Able to live on main level with bedroom/bathroom;Bed/bath upstairs;1/2 bath on main level Home Equipment: Rolling Walker (2 wheels);Cane - single point;Shower seat      Prior Function Prior Level of Function : Independent/Modified Independent                     Hand Dominance        Extremity/Trunk Assessment   Upper Extremity Assessment Upper Extremity Assessment: Overall WFL for tasks assessed    Lower Extremity Assessment Lower Extremity Assessment: RLE deficits/detail RLE Deficits / Details: good quad activation, limited SLR due to pain RLE: Unable to fully assess due to pain RLE Sensation: WNL RLE Coordination: WNL    Cervical / Trunk Assessment Cervical / Trunk Assessment: Normal  Communication   Communication: No difficulties  Cognition Arousal/Alertness: Awake/alert Behavior During Therapy: WFL for tasks assessed/performed Overall Cognitive Status: Within Functional Limits for tasks assessed                                          General Comments      Exercises Total Joint Exercises Ankle Circles/Pumps: AROM;Both;20 reps;Seated   Assessment/Plan    PT Assessment Patient needs continued PT services  PT Problem List Decreased strength;Decreased range of motion;Decreased activity tolerance;Decreased balance;Decreased mobility;Decreased knowledge of use of DME;Decreased knowledge of precautions;Pain       PT Treatment Interventions DME instruction;Gait training;Stair training;Functional mobility training;Therapeutic activities;Therapeutic exercise;Balance training;Patient/family education    PT Goals (Current goals can be found in the Care Plan section)  Acute Rehab PT Goals Patient Stated Goal: stop hurting and get independent PT Goal Formulation: With patient Time For Goal Achievement: 02/04/21 Potential to Achieve Goals: Good    Frequency 7X/week   Barriers to discharge         Co-evaluation               AM-PAC PT "6 Clicks" Mobility  Outcome Measure Help needed turning from your back to your side while in a flat bed without using bedrails?: A Little Help needed moving from lying on your back to sitting on the side of a flat bed without using bedrails?: A Little Help needed moving to and from a bed to a chair (including a wheelchair)?: A Little Help needed standing up from a chair using your arms (e.g., wheelchair or bedside chair)?: A Little Help needed to walk in hospital room?: A Little Help needed climbing 3-5 steps with a railing? : A Lot 6 Click Score: 17    End of Session Equipment Utilized During Treatment: Gait belt Activity Tolerance: Patient tolerated treatment well Patient left: in chair;with call bell/phone within reach;with chair alarm set;with family/visitor present Nurse Communication: Mobility status PT Visit Diagnosis: Muscle weakness (generalized) (M62.81);Difficulty in walking, not elsewhere classified (R26.2)    Time: 7902-4097 PT Time Calculation (min) (ACUTE ONLY): 27 min   Charges:   PT Evaluation $PT Eval Low Complexity: 1 Low PT Treatments $Gait Training: 8-22 mins        Wynn Maudlin, DPT Acute Rehabilitation Services Office [phone] Pager [phone]   Anitra Lauth 01/28/2021, 7:08 PM

## 2021-01-28 NOTE — Discharge Instructions (Addendum)
 Christian Aluisio, MD Total Joint Specialist [facility] [address] [place], [place] [address] [phone]  TOTAL KNEE REPLACEMENT POSTOPERATIVE DIRECTIONS    Knee Rehabilitation, Guidelines Following Surgery  Results after knee surgery are often greatly improved when you follow the exercise, range of motion and muscle strengthening exercises prescribed by your doctor. Safety measures are also important to protect the knee from further injury. If any of these exercises cause you to have increased pain or swelling in your knee joint, decrease the amount until you are comfortable again and slowly increase them. If you have problems or questions, call your caregiver or physical therapist for advice.   BLOOD CLOT PREVENTION Take a 10 mg Xarelto once a day for three weeks following surgery. Then take an 81 mg Aspirin once a day for three weeks. Then discontinue Aspirin. You may resume your vitamins/supplements once you have discontinued the Xarelto. Do not take any NSAIDs (Advil, Aleve, Ibuprofen, Meloxicam, etc.) until you have discontinued the Xarelto.   HOME CARE INSTRUCTIONS  Remove items at home which could result in a fall. This includes throw rugs or furniture in walking pathways.  ICE to the affected knee as much as tolerated. Icing helps control swelling. If the swelling is well controlled you will be more comfortable and rehab easier. Continue to use ice on the knee for pain and swelling from surgery. You may notice swelling that will progress down to the foot and ankle. This is normal after surgery. Elevate the leg when you are not up walking on it.    Continue to use the breathing machine which will help keep your temperature down. It is common for your temperature to cycle up and down following surgery, especially at night when you are not up moving around and exerting yourself. The breathing machine keeps your lungs expanded and your temperature  down. Do not place pillow under the operative knee, focus on keeping the knee straight while resting  DIET You may resume your previous home diet once you are discharged from the hospital.  DRESSING / WOUND CARE / SHOWERING Keep your bulky bandage on for 2 days. On the third post-operative day you may remove the Ace bandage and gauze. There is a waterproof adhesive bandage on your skin which will stay in place until your first follow-up appointment. Once you remove this you will not need to place another bandage You may begin showering 3 days following surgery, but do not submerge the incision under water.  ACTIVITY For the first 5 days, the key is rest and control of pain and swelling Do your home exercises twice a day starting on post-operative day 3. On the days you go to physical therapy, just do the home exercises once that day. You should rest, ice and elevate the leg for 50 minutes out of every hour. Get up and walk/stretch for 10 minutes per hour. After 5 days you can increase your activity slowly as tolerated. Walk with your walker as instructed. Use the walker until you are comfortable transitioning to a cane. Walk with the cane in the opposite hand of the operative leg. You may discontinue the cane once you are comfortable and walking steadily. Avoid periods of inactivity such as sitting longer than an hour when not asleep. This helps prevent blood clots.  You may discontinue the knee immobilizer once you are able to perform a straight leg raise while lying down. You may resume a sexual relationship in one month or   when given the OK by your doctor.  You may return to work once you are cleared by your doctor.  Do not drive a car for 6 weeks or until released by your surgeon.  Do not drive while taking narcotics.  TED HOSE STOCKINGS Wear the elastic stockings on both legs for three weeks following surgery during the day. You may remove them at night for sleeping.  WEIGHT  BEARING Weight bearing as tolerated with assist device (walker, cane, etc) as directed, use it as long as suggested by your surgeon or therapist, typically at least 4-6 weeks.  POSTOPERATIVE CONSTIPATION PROTOCOL Constipation - defined medically as fewer than three stools per week and severe constipation as less than one stool per week.  One of the most common issues patients have following surgery is constipation.  Even if you have a regular bowel pattern at home, your normal regimen is likely to be disrupted due to multiple reasons following surgery.  Combination of anesthesia, postoperative narcotics, change in appetite and fluid intake all can affect your bowels.  In order to avoid complications following surgery, here are some recommendations in order to help you during your recovery period.  Colace (docusate) - Pick up an over-the-counter form of Colace or another stool softener and take twice a day as long as you are requiring postoperative pain medications.  Take with a full glass of water daily.  If you experience loose stools or diarrhea, hold the colace until you stool forms back up. If your symptoms do not get better within 1 week or if they get worse, check with your doctor. Dulcolax (bisacodyl) - Pick up over-the-counter and take as directed by the product packaging as needed to assist with the movement of your bowels.  Take with a full glass of water.  Use this product as needed if not relieved by Colace only.  MiraLax (polyethylene glycol) - Pick up over-the-counter to have on hand. MiraLax is a solution that will increase the amount of water in your bowels to assist with bowel movements.  Take as directed and can mix with a glass of water, juice, soda, coffee, or tea. Take if you go more than two days without a movement. Do not use MiraLax more than once per day. Call your doctor if you are still constipated or irregular after using this medication for 7 days in a row.  If you continue  to have problems with postoperative constipation, please contact the office for further assistance and recommendations.  If you experience "the worst abdominal pain ever" or develop nausea or vomiting, please contact the office immediatly for further recommendations for treatment.  ITCHING If you experience itching with your medications, try taking only a single pain pill, or even half a pain pill at a time.  You can also use Benadryl over the counter for itching or also to help with sleep.   MEDICATIONS See your medication summary on the "After Visit Summary" that the nursing staff will review with you prior to discharge.  You may have some home medications which will be placed on hold until you complete the course of blood thinner medication.  It is important for you to complete the blood thinner medication as prescribed by your surgeon.  Continue your approved medications as instructed at time of discharge.  PRECAUTIONS If you experience chest pain or shortness of breath - call 911 immediately for transfer to the hospital emergency department.  If you develop a fever greater that 101 F, purulent   drainage from wound, increased redness or drainage from wound, foul odor from the wound/dressing, or calf pain - CONTACT YOUR SURGEON.                                                   FOLLOW-UP APPOINTMENTS Make sure you keep all of your appointments after your operation with your surgeon and caregivers. You should call the office at the above phone number and make an appointment for approximately two weeks after the date of your surgery or on the date instructed by your surgeon outlined in the "After Visit Summary".  RANGE OF MOTION AND STRENGTHENING EXERCISES  Rehabilitation of the knee is important following a knee injury or an operation. After just a few days of immobilization, the muscles of the thigh which control the knee become weakened and shrink (atrophy). Knee exercises are designed to build up  the tone and strength of the thigh muscles and to improve knee motion. Often times heat used for twenty to thirty minutes before working out will loosen up your tissues and help with improving the range of motion but do not use heat for the first two weeks following surgery. These exercises can be done on a training (exercise) mat, on the floor, on a table or on a bed. Use what ever works the best and is most comfortable for you Knee exercises include:  Leg Lifts - While your knee is still immobilized in a splint or cast, you can do straight leg raises. Lift the leg to 60 degrees, hold for 3 sec, and slowly lower the leg. Repeat 10-20 times 2-3 times daily. Perform this exercise against resistance later as your knee gets better.  Quad and Hamstring Sets - Tighten up the muscle on the front of the thigh (Quad) and hold for 5-10 sec. Repeat this 10-20 times hourly. Hamstring sets are done by pushing the foot backward against an object and holding for 5-10 sec. Repeat as with quad sets.  Leg Slides: Lying on your back, slowly slide your foot toward your buttocks, bending your knee up off the floor (only go as far as is comfortable). Then slowly slide your foot back down until your leg is flat on the floor again. Angel Wings: Lying on your back spread your legs to the side as far apart as you can without causing discomfort.  A rehabilitation program following serious knee injuries can speed recovery and prevent re-injury in the future due to weakened muscles. Contact your doctor or a physical therapist for more information on knee rehabilitation.   POST-OPERATIVE OPIOID TAPER INSTRUCTIONS: It is important to wean off of your opioid medication as soon as possible. If you do not need pain medication after your surgery it is ok to stop day one. Opioids include: Codeine, Hydrocodone(Norco, Vicodin), Oxycodone(Percocet, oxycontin) and hydromorphone amongst others.  Long term and even short term use of opiods can  cause: Increased pain response Dependence Constipation Depression Respiratory depression And more.  Withdrawal symptoms can include Flu like symptoms Nausea, vomiting And more Techniques to manage these symptoms Hydrate well Eat regular healthy meals Stay active Use relaxation techniques(deep breathing, meditating, yoga) Do Not substitute Alcohol to help with tapering If you have been on opioids for less than two weeks and do not have pain than it is ok to stop all together.  Plan to   wean off of opioids This plan should start within one week post op of your joint replacement. Maintain the same interval or time between taking each dose and first decrease the dose.  Cut the total daily intake of opioids by one tablet each day Next start to increase the time between doses. The last dose that should be eliminated is the evening dose.   IF YOU ARE TRANSFERRED TO A SKILLED REHAB FACILITY If the patient is transferred to a skilled rehab facility following release from the hospital, a list of the current medications will be sent to the facility for the patient to continue.  When discharged from the skilled rehab facility, please have the facility set up the patient's Home Health Physical Therapy prior to being released. Also, the skilled facility will be responsible for providing the patient with their medications at time of release from the facility to include their pain medication, the muscle relaxants, and their blood thinner medication. If the patient is still at the rehab facility at time of the two week follow up appointment, the skilled rehab facility will also need to assist the patient in arranging follow up appointment in our office and any transportation needs.  MAKE SURE YOU:  Understand these instructions.  Get help right away if you are not doing well or get worse.   DENTAL ANTIBIOTICS:  In most cases prophylactic antibiotics for Dental procdeures after total joint surgery are  not necessary.  Exceptions are as follows:  1. History of prior total joint infection  2. Severely immunocompromised (Organ Transplant, cancer chemotherapy, Rheumatoid biologic meds such as Humera)  3. Poorly controlled diabetes (A1C &gt; 8.0, blood glucose over 200)  If you have one of these conditions, contact your surgeon for an antibiotic prescription, prior to your dental procedure.    Pick up stool softner and laxative for home use following surgery while on pain medications. Do not submerge incision under water. Please use good hand washing techniques while changing dressing each day. May shower starting three days after surgery. Please use a clean towel to pat the incision dry following showers. Continue to use ice for pain and swelling after surgery. Do not use any lotions or creams on the incision until instructed by your surgeon.  Information on my medicine - XARELTO (Rivaroxaban)  Why was Xarelto prescribed for you? Xarelto was prescribed for you to reduce the risk of blood clots forming after orthopedic surgery. The medical term for these abnormal blood clots is venous thromboembolism (VTE).  What do you need to know about xarelto ? Take your Xarelto ONCE DAILY at the same time every day. You may take it either with or without food.  If you have difficulty swallowing the tablet whole, you may crush it and mix in applesauce just prior to taking your dose.  Take Xarelto exactly as prescribed by your doctor and DO NOT stop taking Xarelto without talking to the doctor who prescribed the medication.  Stopping without other VTE prevention medication to take the place of Xarelto may increase your risk of developing a clot.  After discharge, you should have regular check-up appointments with your healthcare provider that is prescribing your Xarelto.    What do you do if you miss a dose? If you miss a dose, take it as soon as you remember on the same day then  continue your regularly scheduled once daily regimen the next day. Do not take two doses of Xarelto on the same day.   Important   Safety Information A possible side effect of Xarelto is bleeding. You should call your healthcare provider right away if you experience any of the following: Bleeding from an injury or your nose that does not stop. Unusual colored urine (red or dark brown) or unusual colored stools (red or black). Unusual bruising for unknown reasons. A serious fall or if you hit your head (even if there is no bleeding).  Some medicines may interact with Xarelto and might increase your risk of bleeding while on Xarelto. To help avoid this, consult your healthcare provider or pharmacist prior to using any new prescription or non-prescription medications, including herbals, vitamins, non-steroidal anti-inflammatory drugs (NSAIDs) and supplements.  This website has more information on Xarelto: www.xarelto.com.    

## 2021-01-28 NOTE — Anesthesia Procedure Notes (Signed)
Anesthesia Regional Block: Adductor canal block   Pre-Anesthetic Checklist: , timeout performed,  Correct Patient, Correct Site, Correct Laterality,  Correct Procedure, Correct Position, site marked,  Risks and benefits discussed,  Surgical consent,  Pre-op evaluation,  At surgeon's request and post-op pain management  Laterality: Right  Prep: chloraprep       Needles:  Injection technique: Single-shot  Needle Type: Echogenic Stimulator Needle     Needle Length: 10cm  Needle Gauge: 20     Additional Needles:   Procedures:,,,, ultrasound used (permanent image in chart),,    Narrative:  Start time: 01/28/2021 10:40 AM End time: 01/28/2021 10:45 AM Injection made incrementally with aspirations every 5 mL.  Performed by: Personally  Anesthesiologist: Mellody Dance, MD  Additional Notes: Functioning IV was confirmed and monitors were applied.  Sterile prep and drape,hand hygiene and sterile gloves were used. Ultrasound guidance: relevant anatomy identified, needle position confirmed, local anesthetic spread visualized around nerve(s)., vascular puncture avoided. Negative aspiration and negative test dose prior to incremental administration of local anesthetic. The patient tolerated the procedure well.

## 2021-01-28 NOTE — Progress Notes (Signed)
Orthopedic Tech Progress Note Patient Details:  Christian [name] March 15, 1961 [id number]  CPM Right Knee CPM Right Knee: On Right Knee Flexion (Degrees): 40 Right Knee Extension (Degrees): 10  Post Interventions Patient Tolerated: Well Instructions Provided: Care of device  [name] 01/28/2021, 1:45 PM

## 2021-01-29 DIAGNOSIS — M1711 Unilateral primary osteoarthritis, right knee: Secondary | ICD-10-CM | POA: Diagnosis not present

## 2021-01-29 LAB — BASIC METABOLIC PANEL
Anion gap: 9 (ref 5–15)
BUN: 14 mg/dL (ref 8–23)
CO2: 23 mmol/L (ref 22–32)
Calcium: 8.2 mg/dL — ABNORMAL LOW (ref 8.9–10.3)
Chloride: 100 mmol/L (ref 98–111)
Creatinine, Ser: 0.61 mg/dL (ref 0.61–1.24)
GFR, Estimated: >60 mL/min (ref >60)
Glucose, Bld: 156 mg/dL — ABNORMAL HIGH (ref 70–99)
Potassium: 3.6 mmol/L (ref 3.5–5.1)
Sodium: 132 mmol/L — ABNORMAL LOW (ref 135–145)

## 2021-01-29 LAB — HEMOGLOBIN A1C
Hgb A1c MFr Bld: 5.7 % — ABNORMAL HIGH (ref 4.8–5.6)
Mean Plasma Glucose: 116.89 mg/dL

## 2021-01-29 LAB — CBC
HCT: 37.9 % — ABNORMAL LOW (ref 39.0–52.0)
Hemoglobin: 12.9 g/dL — ABNORMAL LOW (ref 13.0–17.0)
MCH: 28.3 pg (ref 26.0–34.0)
MCHC: 34 g/dL (ref 30.0–36.0)
MCV: 83.1 fL (ref 80.0–100.0)
Platelets: 210 10*3/uL (ref 150–400)
RBC: 4.56 MIL/uL (ref 4.22–5.81)
RDW: 13.5 % (ref 11.5–15.5)
WBC: 10.4 10*3/uL (ref 4.0–10.5)
nRBC: 0 % (ref 0.0–0.2)

## 2021-01-29 LAB — GLUCOSE, CAPILLARY
Glucose-Capillary: 151 mg/dL — ABNORMAL HIGH (ref 70–99)
Glucose-Capillary: 126 mg/dL — ABNORMAL HIGH (ref 70–99)
Glucose-Capillary: 131 mg/dL — ABNORMAL HIGH (ref 70–99)

## 2021-01-29 MED ORDER — OXYCODONE HCL 5 MG PO TABS: 5.0000 mg | ORAL_TABLET | Freq: Four times a day (QID) | ORAL | 0 refills | Status: DC | PRN

## 2021-01-29 MED ORDER — RIVAROXABAN 10 MG PO TABS: 10.0000 mg | ORAL_TABLET | Freq: Every day | ORAL | 0 refills | Status: AC

## 2021-01-29 NOTE — Progress Notes (Signed)
Physical Therapy Treatment Patient Details Name: Christian [name] MRN: [id number] DOB: 1961/02/10 Today's Date: 01/29/2021   History of Present Illness Patient is 60 y.o. male s/p Rt TKA on 01/28/21 with PMH significant for OA, HTN, HLD, back pain, ACDF C3-7.    PT Comments    POD # 1 pm session Assisted OOB to amb to bathroom therm in hallway.  Practiced stairs again.  Assisted back to bed.  Addressed all mobility questions, discussed appropriate activity, educated on use of ICE.  Pt ready for D/C to home.   Recommendations for follow up therapy are one component of a multi-disciplinary discharge planning process, led by the attending physician.  Recommendations may be updated based on patient status, additional functional criteria and insurance authorization.  Follow Up Recommendations  Follow physician's recommendations for discharge plan and follow up therapies     Assistance Recommended at Discharge    Equipment Recommendations  None recommended by PT    Recommendations for Other Services       Precautions / Restrictions Precautions Precautions: Fall Precaution Comments: instructed no pillow under knee Restrictions Weight Bearing Restrictions: No Other Position/Activity Restrictions: WBAT     Mobility  Bed Mobility Overal bed mobility: Needs Assistance Bed Mobility: Supine to Sit;Sit to Supine     Supine to sit: Supervision;Min guard Sit to supine: Supervision;Min guard   General bed mobility comments: pt self able using belt    Transfers Overall transfer level: Needs assistance Equipment used: Rolling walker (2 wheels) Transfers: Sit to/from Stand Sit to Stand: Supervision;Min guard           General transfer comment: good safety cognition and use of hands to steady self.  25% VC's to ectend LE prior to sit to decrease stress/pain.    Ambulation/Gait Ambulation/Gait assistance: Supervision;Min guard Gait Distance (Feet): 32 Feet Assistive device:  Rolling walker (2 wheels) Gait Pattern/deviations: Step-to pattern;Decreased stride length;Decreased weight shift to right;Antalgic Gait velocity: decreased     General Gait Details: increased time with 25% VC's on proper walker to self distance and safety with turns,  tolerated well.   Stairs Stairs: Yes Stairs assistance: Min guard;Supervision Stair Management: No rails;Forwards;With walker Number of Stairs: 1 General stair comments: 25% VC's on proper walker placement and sequencing navigating one step into his home.   Wheelchair Mobility    Modified Rankin (Stroke Patients Only)       Balance                                            Cognition Arousal/Alertness: Awake/alert Behavior During Therapy: WFL for tasks assessed/performed Overall Cognitive Status: Within Functional Limits for tasks assessed                                 General Comments: AxO x 3 very pleasant        Exercises      General Comments        Pertinent Vitals/Pain Pain Assessment: 0-10 Pain Score: 7  Pain Location: Rt knee Pain Descriptors / Indicators: Aching;Discomfort;Grimacing Pain Intervention(s): Monitored during session;Premedicated before session;Repositioned;Ice applied    Home Living                          Prior Function  PT Goals (current goals can now be found in the care plan section) Progress towards PT goals: Goals met and updated - see care plan    Frequency    7X/week      PT Plan Current plan remains appropriate    Co-evaluation              AM-PAC PT "6 Clicks" Mobility   Outcome Measure  Help needed turning from your back to your side while in a flat bed without using bedrails?: A Little Help needed moving from lying on your back to sitting on the side of a flat bed without using bedrails?: A Little Help needed moving to and from a bed to a chair (including a wheelchair)?: A  Little Help needed standing up from a chair using your arms (e.g., wheelchair or bedside chair)?: A Little Help needed to walk in hospital room?: A Little Help needed climbing 3-5 steps with a railing? : A Little 6 Click Score: 18    End of Session Equipment Utilized During Treatment: Gait belt Activity Tolerance: Patient tolerated treatment well Patient left: in bed Nurse Communication: Mobility status PT Visit Diagnosis: Muscle weakness (generalized) (M62.81);Difficulty in walking, not elsewhere classified (R26.2)     Time: 0355-9741 PT Time Calculation (min) (ACUTE ONLY): 15 min  Charges:  $Gait Training: 8-22 mins $Therapeutic Exercise: 8-22 mins                     {Lan Entsminger  PTA Acute  Rehabilitation Services Pager      [phone] Office      [phone]

## 2021-01-29 NOTE — Plan of Care (Signed)
Plan of care reviewed and discussed with the patient. 

## 2021-01-29 NOTE — Plan of Care (Signed)
Pt ready to DC home with family. 

## 2021-01-29 NOTE — TOC Transition Note (Signed)
Transition of Care [facility]) - CM/SW Discharge Note   Patient Details  Name: Christian [name] MRN: [id number] Date of Birth: Oct 21, 1960  [facility] CM/SW Contact:  Lennart Pall, LCSW Phone Number: 01/29/2021, 10:00 AM   Clinical Narrative:    Met with pt and confirming he has all needed DME at home.  Plan for OPPT @ [place].  No TOC needs.   Final next level of care: OP Rehab Barriers to Discharge: No Barriers Identified   Patient Goals and CMS Choice Patient states their goals for this hospitalization and ongoing recovery are:: return home      Discharge Placement                       Discharge Plan and Services                DME Arranged: N/A DME Agency: NA                  Social Determinants of Health (SDOH) Interventions     Readmission Risk Interventions No flowsheet data found.

## 2021-01-29 NOTE — Progress Notes (Signed)
Subjective: 1 Day Post-Op Procedure(s) (LRB): TOTAL KNEE ARTHROPLASTY (Right) Patient reports pain as mild.   Patient seen in rounds by Dr. Lequita Halt. Patient is well, and has had no acute complaints or problems other than pain in the right knee. Denies chest pain or SOB. Foley catheter removed this AM. We will continue therapy today.   Objective: Vital signs in last 24 hours: Temp:  [97.6 F (36.4 C)-98.7 F (37.1 C)] 98.2 F (36.8 C) (11/08 0318) Pulse Rate:  [55-82] 82 (11/08 0318) Resp:  [10-18] 18 (11/08 0318) BP: (100-165)/(63-87) 152/74 (11/08 0318) SpO2:  [91 %-100 %] 96 % (11/08 0318) Weight:  [95.7 kg] 95.7 kg (11/07 0922)  Intake/Output from previous day:  Intake/Output Summary (Last 24 hours) at 01/29/2021 0738 Last data filed at 01/29/2021 0319 Gross per 24 hour  Intake 3847.63 ml  Output 800 ml  Net 3047.63 ml     Intake/Output this shift: No intake/output data recorded.  Labs: Recent Labs    01/29/21 0316  HGB 12.9*   Recent Labs    01/29/21 0316  WBC 10.4  RBC 4.56  HCT 37.9*  PLT 210   Recent Labs    01/29/21 0316  NA 132*  K 3.6  CL 100  CO2 23  BUN 14  CREATININE 0.61  GLUCOSE 156*  CALCIUM 8.2*   No results for input(s): LABPT, INR in the last 72 hours.  Exam: General - Patient is Alert and Oriented Extremity - Neurologically intact Neurovascular intact Sensation intact distally Dorsiflexion/Plantar flexion intact Dressing - dressing C/D/I Motor Function - intact, moving foot and toes well on exam.   Past Medical History:  Diagnosis Date   Chronic back pain    Chronic knee pain    DDD (degenerative disc disease), lumbosacral    History of kidney stones    Hyperlipidemia    Hypertension    Kidney stone    OA (osteoarthritis)    knees   Sleep apnea    Spinal stenosis, lumbar region with neurogenic claudication    Weakness of extremity    per pt upper and lower extermities due to back issues    Assessment/Plan: 1  Day Post-Op Procedure(s) (LRB): TOTAL KNEE ARTHROPLASTY (Right) Principal Problem:   OA (osteoarthritis) of knee Active Problems:   Primary osteoarthritis of right knee  Estimated body mass index is 35.11 kg/m as calculated from the following:   Height as of this encounter: 5\' 5"  (1.651 m).   Weight as of this encounter: 95.7 kg. Advance diet Up with therapy D/C IV fluids   Patient's anticipated LOS is less than 2 midnights, meeting these requirements: - Younger than 60 - Lives within 1 hour of care - Has a competent adult at home to recover with post-op recover - NO history of  - Chronic pain requiring opiods  - Diabetes  - Coronary Artery Disease  - Heart failure  - Heart attack  - Stroke  - DVT/VTE  - Cardiac arrhythmia  - Respiratory Failure/COPD  - Renal failure  - Anemia  - Advanced Liver disease  DVT Prophylaxis - Xarelto Weight bearing as tolerated. Continue therapy.  Plan is to go Home after hospital stay. Plan for discharge later today if progresses with therapy and meeting goals. Scheduled for OPPT in [place] Follow-up in the office in 2 weeks  The PDMP database was reviewed today prior to any opioid medications being prescribed to this patient.  76, PA-C Orthopedic Surgery [phone] 01/29/2021, 7:38  AM

## 2021-01-29 NOTE — Progress Notes (Signed)
Physical Therapy Treatment Patient Details Name: Christian [name] MRN: [id number] DOB: 1961/01/20 Today's Date: 01/29/2021   History of Present Illness Patient is 60 y.o. male s/p Rt TKA on 01/28/21 with PMH significant for OA, HTN, HLD, back pain, ACDF C3-7.    PT Comments    POD # 1 am session Pt OOB in recliner.  Assisted with amb in hallway.  General transfer comment: good safety cognition and use of hands to steady self.  25% VC's to ectend LE prior to sit to decrease stress/pain. General Gait Details: increased time with 25% VC's on proper walker to self distance and safety with turns,  tolerated well.General stair comments: 25% VC's on proper walker placement and sequencing navigating one step into his home. Performed few TE's followed by ICE.  Pt will need an other PT session to increase amb distance as well as complete HEP Instructions.   Recommendations for follow up therapy are one component of a multi-disciplinary discharge planning process, led by the attending physician.  Recommendations may be updated based on patient status, additional functional criteria and insurance authorization.  Follow Up Recommendations  Follow physician's recommendations for discharge plan and follow up therapies     Assistance Recommended at Discharge    Equipment Recommendations  None recommended by PT    Recommendations for Other Services       Precautions / Restrictions Precautions Precautions: Fall Precaution Comments: instructed no pillow under knee Restrictions Weight Bearing Restrictions: No Other Position/Activity Restrictions: WBAT     Mobility  Bed Mobility               General bed mobility comments: OOB in recliner    Transfers Overall transfer level: Needs assistance Equipment used: Rolling walker (2 wheels) Transfers: Sit to/from Stand Sit to Stand: Supervision;Min guard           General transfer comment: good safety cognition and use of hands to steady  self.  25% VC's to ectend LE prior to sit to decrease stress/pain.    Ambulation/Gait Ambulation/Gait assistance: Supervision;Min guard Gait Distance (Feet): 25 Feet Assistive device: Rolling walker (2 wheels) Gait Pattern/deviations: Step-to pattern;Decreased stride length;Decreased weight shift to right;Antalgic Gait velocity: decreased     General Gait Details: increased time with 25% VC's on proper walker to self distance and safety with turns,  tolerated well.   Stairs Stairs: Yes Stairs assistance: Min guard;Supervision Stair Management: No rails;Forwards;With walker Number of Stairs: 1 General stair comments: 25% VC's on proper walker placement and sequencing navigating one step into his home.   Wheelchair Mobility    Modified Rankin (Stroke Patients Only)       Balance                                            Cognition Arousal/Alertness: Awake/alert Behavior During Therapy: WFL for tasks assessed/performed Overall Cognitive Status: Within Functional Limits for tasks assessed                                 General Comments: AxO x 3 very pleasant        Exercises  10 reps AP 05 reps knee presses 05 reps HS    General Comments        Pertinent Vitals/Pain Pain Assessment: 0-10 Pain Score: 7  Pain Location:  Rt knee Pain Descriptors / Indicators: Aching;Discomfort;Grimacing Pain Intervention(s): Monitored during session;Premedicated before session;Repositioned;Ice applied    Home Living                          Prior Function            PT Goals (current goals can now be found in the care plan section) Progress towards PT goals: Goals met and updated - see care plan    Frequency    7X/week      PT Plan Current plan remains appropriate    Co-evaluation              AM-PAC PT "6 Clicks" Mobility   Outcome Measure  Help needed turning from your back to your side while in a flat bed  without using bedrails?: A Little Help needed moving from lying on your back to sitting on the side of a flat bed without using bedrails?: A Little Help needed moving to and from a bed to a chair (including a wheelchair)?: A Little Help needed standing up from a chair using your arms (e.g., wheelchair or bedside chair)?: A Little Help needed to walk in hospital room?: A Little Help needed climbing 3-5 steps with a railing? : A Little 6 Click Score: 18    End of Session Equipment Utilized During Treatment: Gait belt Activity Tolerance: Patient tolerated treatment well Patient left: in chair;with call bell/phone within reach;with chair alarm set;with family/visitor present Nurse Communication: Mobility status PT Visit Diagnosis: Muscle weakness (generalized) (M62.81);Difficulty in walking, not elsewhere classified (R26.2)     Time: 6691-6756 PT Time Calculation (min) (ACUTE ONLY): 24 min  Charges:  $Gait Training: 8-22 mins $Therapeutic Exercise: 8-22 mins                    Rica Koyanagi  PTA Acute  Rehabilitation Services Pager      [phone] Office      [phone]

## 2021-01-30 ENCOUNTER — Encounter (HOSPITAL_COMMUNITY): Payer: Self-pay | Admitting: Orthopedic Surgery

## 2021-02-04 NOTE — Discharge Summary (Signed)
Physician Discharge Summary   Patient ID: Christian [name] MRN: [id number] DOB/AGE: [birth year] 60 y.o.  Admit date: 01/28/2021 Discharge date: 01/29/2021  Primary Diagnosis: Osteoarthritis, right knee   Admission Diagnoses:  Past Medical History:  Diagnosis Date   Chronic back pain    Chronic knee pain    DDD (degenerative disc disease), lumbosacral    History of kidney stones    Hyperlipidemia    Hypertension    Kidney stone    OA (osteoarthritis)    knees   Sleep apnea    Spinal stenosis, lumbar region with neurogenic claudication    Weakness of extremity    per pt upper and lower extermities due to back issues   Discharge Diagnoses:   Principal Problem:   OA (osteoarthritis) of knee Active Problems:   Primary osteoarthritis of right knee  Estimated body mass index is 35.11 kg/m as calculated from the following:   Height as of this encounter: 5\' 5"  (1.651 m).   Weight as of this encounter: 95.7 kg.  Procedure:  Procedure(s) (LRB): TOTAL KNEE ARTHROPLASTY (Right)   Consults: None  HPI: Christian [name] is a 60 y.o. year old male with end stage OA of his right knee with progressively worsening pain and dysfunction. He has constant pain, with activity and at rest and significant functional deficits with difficulties even with ADLs. He has had extensive non-op management including analgesics, injections of cortisone, and home exercise program, but remains in significant pain with significant dysfunction. Radiographs show bone on bone arthritis medial and patellofemoral. He presents now for right Total Knee Arthroplasty.     Laboratory Data: Admission on 01/28/2021, Discharged on 01/29/2021  Component Date Value Ref Range Status   Hgb A1c MFr Bld 01/29/2021 5.7 (A)  4.8 - 5.6 % Final   Comment: (NOTE) Pre diabetes:          5.7%-6.4%  Diabetes:              >6.4%  Glycemic control for   <7.0% adults with diabetes    Mean Plasma Glucose 01/29/2021 116.89  mg/dL Final    Performed at [facility] [address] [place], [place] [address]   Glucose-Capillary 01/28/2021 132 (A)  70 - 99 mg/dL Final   Glucose reference range applies only to samples taken after fasting for at least 8 hours.   WBC 01/29/2021 10.4  4.0 - 10.5 K/uL Final   RBC 01/29/2021 4.56  4.22 - 5.81 MIL/uL Final   Hemoglobin 01/29/2021 12.9 (A)  13.0 - 17.0 g/dL Final   HCT 01/29/2021 37.9 (A)  39.0 - 52.0 % Final   MCV 01/29/2021 83.1  80.0 - 100.0 fL Final   MCH 01/29/2021 28.3  26.0 - 34.0 pg Final   MCHC 01/29/2021 34.0  30.0 - 36.0 g/dL Final   RDW 01/29/2021 13.5  11.5 - 15.5 % Final   Platelets 01/29/2021 210  150 - 400 K/uL Final   nRBC 01/29/2021 0.0  0.0 - 0.2 % Final   Performed at [facility], [place] [address] [place], [place] [address]   Sodium 01/29/2021 132 (A)  135 - 145 mmol/L Final   Potassium 01/29/2021 3.6  3.5 - 5.1 mmol/L Final   Chloride 01/29/2021 100  98 - 111 mmol/L Final   CO2 01/29/2021 23  22 - 32 mmol/L Final   Glucose, Bld 01/29/2021 156 (A)  70 - 99 mg/dL Final   Glucose reference range applies only to samples  taken after fasting for at least 8 hours.   BUN 01/29/2021 14  8 - 23 mg/dL Final   Creatinine, Ser 01/29/2021 0.61  0.61 - 1.24 mg/dL Final   Calcium 01/29/2021 8.2 (A)  8.9 - 10.3 mg/dL Final   GFR, Estimated 01/29/2021 >60  >60 mL/min Final   Comment: (NOTE) Calculated using the CKD-EPI Creatinine Equation (2021)    Anion gap 01/29/2021 9  5 - 15 Final   Performed at [facility], [place] [address] [place], [place] [address]   Glucose-Capillary 01/28/2021 151 (A)  70 - 99 mg/dL Final   Glucose reference range applies only to samples taken after fasting for at least 8 hours.   Glucose-Capillary 01/29/2021 126 (A)  70 - 99 mg/dL Final   Glucose reference range applies only to samples taken after fasting for at least 8 hours.   Glucose-Capillary 01/29/2021 131 (A)  70 - 99 mg/dL Final   Glucose  reference range applies only to samples taken after fasting for at least 8 hours.  Orders Only on 01/24/2021  Component Date Value Ref Range Status   SARS Coronavirus 2 01/24/2021 RESULT: NEGATIVE   Final   Comment: RESULT: NEGATIVESARS-CoV-2 INTERPRETATION:A NEGATIVE  test result means that SARS-CoV-2 RNA was not present in the specimen above the limit of detection of this test. This does not preclude a possible SARS-CoV-2 infection and should not be used as the  sole basis for patient management decisions. Negative results must be combined with clinical observations, patient history, and epidemiological information. Optimum specimen types and timing for peak viral levels during infections caused by SARS-CoV-2  have not been determined. Collection of multiple specimens or types of specimens may be necessary to detect virus. Improper specimen collection and handling, sequence variability under primers/probes, or organism present below the limit of detection may  lead to false negative results. Positive and negative predictive values of testing are highly dependent on prevalence. False negative test results are more likely when prevalence of disease is high.The expected result is NEGATIVE.Fact S                          heet for  Healthcare Providers: LocalChronicle.no Sheet for Patients: SalonLookup.es Reference Range - Negative   Hospital Outpatient Visit on 01/16/2021  Component Date Value Ref Range Status   MRSA, PCR 01/16/2021 NEGATIVE  NEGATIVE Final   Staphylococcus aureus 01/16/2021 NEGATIVE  NEGATIVE Final   Comment: (NOTE) The Xpert SA Assay (FDA approved for NASAL specimens in patients 45 years of age and older), is one component of a comprehensive surveillance program. It is not intended to diagnose infection nor to guide or monitor treatment. Performed at [facility], [place] [address], [place], [place]  [address]    WBC 01/16/2021 3.7 (A)  4.0 - 10.5 K/uL Final   RBC 01/16/2021 5.52  4.22 - 5.81 MIL/uL Final   Hemoglobin 01/16/2021 15.7  13.0 - 17.0 g/dL Final   HCT 01/16/2021 46.9  39.0 - 52.0 % Final   MCV 01/16/2021 85.0  80.0 - 100.0 fL Final   MCH 01/16/2021 28.4  26.0 - 34.0 pg Final   MCHC 01/16/2021 33.5  30.0 - 36.0 g/dL Final   RDW 01/16/2021 13.9  11.5 - 15.5 % Final   Platelets 01/16/2021 208  150 - 400 K/uL Final   nRBC 01/16/2021 0.0  0.0 - 0.2 % Final   Performed at [facility], [address] [place], [place] [address]   Sodium 01/16/2021 137  135 - 145 mmol/L Final   Potassium 01/16/2021 4.0  3.5 - 5.1 mmol/L Final   Chloride 01/16/2021 105  98 - 111 mmol/L Final   CO2 01/16/2021 24  22 - 32 mmol/L Final   Glucose, Bld 01/16/2021 150 (A)  70 - 99 mg/dL Final   Glucose reference range applies only to samples taken after fasting for at least 8 hours.   BUN 01/16/2021 14  8 - 23 mg/dL Final   Creatinine, Ser 01/16/2021 0.86  0.61 - 1.24 mg/dL Final   Calcium 16/09/3708 8.9  8.9 - 10.3 mg/dL Final   Total Protein 62/69/4854 7.3  6.5 - 8.1 g/dL Final   Albumin 62/70/3500 3.8  3.5 - 5.0 g/dL Final   AST 93/81/8299 17  15 - 41 U/L Final   ALT 01/16/2021 13  0 - 44 U/L Final   Alkaline Phosphatase 01/16/2021 71  38 - 126 U/L Final   Total Bilirubin 01/16/2021 0.8  0.3 - 1.2 mg/dL Final   GFR, Estimated 01/16/2021 >60  >60 mL/min Final   Comment: (NOTE) Calculated using the CKD-EPI Creatinine Equation (2021)    Anion gap 01/16/2021 8  5 - 15 Final   Performed at [facility], [address] [place], [place] [address]   Prothrombin Time 01/16/2021 13.1  11.4 - 15.2 seconds Final   INR 01/16/2021 1.0  0.8 - 1.2 Final   Comment: (NOTE) INR goal varies based on device and disease states. Performed at [facility], [address] [place], [place] [address]    Hgb A1c MFr Bld 01/16/2021 5.6  4.8 - 5.6 % Final    Comment: (NOTE) Pre diabetes:          5.7%-6.4%  Diabetes:              >6.4%  Glycemic control for   <7.0% adults with diabetes    Mean Plasma Glucose 01/16/2021 114.02  mg/dL Final   Performed at [facility], [address], [place], [place] [address]     X-Rays:No results found.  EKG: Orders placed or performed during the hospital encounter of 01/16/21   EKG 12 lead per protocol   EKG 12 lead per protocol     Hospital Course: Christian [name] is a 60 y.o. who was [facility] They were brought to the operating room on 01/28/2021 and underwent Procedure(s): TOTAL KNEE ARTHROPLASTY.  Patient tolerated the procedure well and was later transferred to the recovery room and then to the orthopaedic floor for postoperative care. They were given PO and IV analgesics for pain control following their surgery. They were given 24 hours of postoperative antibiotics of  Anti-infectives (From admission, onward)    Start     Dose/Rate Route Frequency Ordered Stop   01/28/21 1800  ceFAZolin (ANCEF) IVPB 2g/100 mL premix        2 g 200 mL/hr over 30 Minutes Intravenous Every 6 hours 01/28/21 1538 01/29/21 0227   01/28/21 0915  ceFAZolin (ANCEF) IVPB 2g/100 mL premix        2 g 200 mL/hr over 30 Minutes Intravenous On call to O.R. 01/28/21 5102 01/28/21 1244      and started on DVT prophylaxis in the form of Xarelto.   PT and OT were ordered for total joint protocol. Discharge planning consulted to help with postop disposition and equipment needs.  Patient  had a good night on the evening of surgery. They started to get up OOB with therapy on POD #0. Pt was seen during rounds and was ready to go home pending progress with therapy. He worked with therapy on POD #1 and was meeting his goals. Pt was discharged to home later that day in stable condition.  Diet: Regular diet Activity: WBAT Follow-up: in 2 weeks Disposition: Home with OPPT Discharged Condition:  stable   Discharge Instructions     Call MD / Call 911   Complete by: As directed    If you experience chest pain or shortness of breath, CALL 911 and be transported to the hospital emergency room.  If you develope a fever above 101 F, pus (white drainage) or increased drainage or redness at the wound, or calf pain, call your surgeon's office.   Change dressing   Complete by: As directed    You may remove the bulky bandage (ACE wrap and gauze) two days after surgery. You will have an adhesive waterproof bandage underneath. Leave this in place until your first follow-up appointment.   Constipation Prevention   Complete by: As directed    Drink plenty of fluids.  Prune juice may be helpful.  You may use a stool softener, such as Colace (over the counter) 100 mg twice a day.  Use MiraLax (over the counter) for constipation as needed.   Diet - low sodium heart healthy   Complete by: As directed    Do not put a pillow under the knee. Place it under the heel.   Complete by: As directed    Driving restrictions   Complete by: As directed    No driving for two weeks   Post-operative opioid taper instructions:   Complete by: As directed    POST-OPERATIVE OPIOID TAPER INSTRUCTIONS: It is important to wean off of your opioid medication as soon as possible. If you do not need pain medication after your surgery it is ok to stop day one. Opioids include: Codeine, Hydrocodone(Norco, Vicodin), Oxycodone(Percocet, oxycontin) and hydromorphone amongst others.  Long term and even short term use of opiods can cause: Increased pain response Dependence Constipation Depression Respiratory depression And more.  Withdrawal symptoms can include Flu like symptoms Nausea, vomiting And more Techniques to manage these symptoms Hydrate well Eat regular healthy meals Stay active Use relaxation techniques(deep breathing, meditating, yoga) Do Not substitute Alcohol to help with tapering If you have been on  opioids for less than two weeks and do not have pain than it is ok to stop all together.  Plan to wean off of opioids This plan should start within one week post op of your joint replacement. Maintain the same interval or time between taking each dose and first decrease the dose.  Cut the total daily intake of opioids by one tablet each day Next start to increase the time between doses. The last dose that should be eliminated is the evening dose.      TED hose   Complete by: As directed    Use stockings (TED hose) for three weeks on both leg(s).  You may remove them at night for sleeping.   Weight bearing as tolerated   Complete by: As directed       Allergies as of 01/29/2021       Reactions   Pork-derived Products    PT DOES NOT EAT PORK        Medication List     STOP taking these  medications    aspirin EC 81 MG tablet   HYDROcodone-acetaminophen 5-325 MG tablet Commonly known as: NORCO/VICODIN   meloxicam 15 MG tablet Commonly known as: MOBIC   Vitamin D (Ergocalciferol) 1.25 MG (50000 UNIT) Caps capsule Commonly known as: DRISDOL       TAKE these medications    amLODipine 10 MG tablet Commonly known as: NORVASC Take 10 mg by mouth daily.   baclofen 20 MG tablet Commonly known as: LIORESAL Take 20 mg by mouth 2 (two) times daily.   carvedilol 25 MG tablet Commonly known as: COREG Take 25 mg by mouth 2 (two) times daily with a meal.   DULoxetine 30 MG capsule Commonly known as: CYMBALTA Take 30 mg by mouth 3 (three) times daily.   gabapentin 800 MG tablet Commonly known as: NEURONTIN Take 800 mg by mouth 4 (four) times daily.   hydrochlorothiazide 25 MG tablet Commonly known as: HYDRODIURIL Take 25 mg by mouth daily.   losartan 100 MG tablet Commonly known as: COZAAR Take 100 mg by mouth daily.   lovastatin 40 MG tablet Commonly known as: MEVACOR Take 40 mg by mouth at bedtime.   oxyCODONE 5 MG immediate release tablet Commonly known  as: Oxy IR/ROXICODONE Take 1-2 tablets (5-10 mg total) by mouth every 6 (six) hours as needed for moderate pain or severe pain.   pantoprazole 40 MG tablet Commonly known as: PROTONIX Take 40 mg by mouth daily.   rivaroxaban 10 MG Tabs tablet Commonly known as: XARELTO Take 1 tablet (10 mg total) by mouth daily with breakfast for 20 days. Then resume one 81 mg aspirin once a day.   tiZANidine 4 MG capsule Commonly known as: ZANAFLEX Take 4 mg by mouth every 6 (six) hours as needed for muscle spasms.   Victoza 18 MG/3ML Sopn Generic drug: liraglutide Inject 1.8 mg into the skin daily.               Discharge Care Instructions  (From admission, onward)           Start     Ordered   01/29/21 0000  Weight bearing as tolerated        01/29/21 0743   01/29/21 0000  Change dressing       Comments: You may remove the bulky bandage (ACE wrap and gauze) two days after surgery. You will have an adhesive waterproof bandage underneath. Leave this in place until your first follow-up appointment.   01/29/21 0743            Follow-up Information     Gaynelle Arabian, MD. Schedule an appointment as soon as possible for a visit in 2 week(s).   Specialty: Orthopedic Surgery Contact information: [address] [place] [address] [phone]                 Signed: Theresa Duty, PA-C Orthopedic Surgery 02/04/2021, 8:11 AM

## 2021-07-25 NOTE — H&P (Signed)
TOTAL KNEE ADMISSION H&P ? ?Patient is being admitted for left total knee arthroplasty. ? ?Subjective: ? ?Chief Complaint: Left knee pain. ? ?HPI: Christian [name], 61 y.o. male has a history of pain and functional disability in the left knee due to arthritis and has failed non-surgical conservative treatments for greater than 12 weeks to include NSAID's and/or analgesics, corticosteriod injections, and activity modification. Onset of symptoms was gradual, starting several years ago with gradually worsening course since that time. The patient noted no past surgery on the left knee.  Patient currently rates pain in the left knee at 8 out of 10 with activity. Patient has night pain, worsening of pain with activity and weight bearing, and crepitus. Patient has evidence of  bone-on-bone arthritis in the medial and patellofemoral compartments with varus deformity and tibial subluxation  by imaging studies. There is no active infection. ? ?Patient Active Problem List  ? Diagnosis Date Noted  ? OA (osteoarthritis) of knee 01/28/2021  ? Primary osteoarthritis of right knee 01/28/2021  ? Spinal stenosis in cervical region 11/03/2012  ? Cervical disc disorder with radiculopathy of cervical region 11/03/2012  ? Essential hypertension, benign 11/07/2011  ? History of kidney stones 11/07/2011  ? ? ?Past Medical History:  ?Diagnosis Date  ? Chronic back pain   ? Chronic knee pain   ? DDD (degenerative disc disease), lumbosacral   ? History of kidney stones   ? Hyperlipidemia   ? Hypertension   ? Kidney stone   ? OA (osteoarthritis)   ? knees  ? Sleep apnea   ? Spinal stenosis, lumbar region with neurogenic claudication   ? Weakness of extremity   ? per pt upper and lower extermities due to back issues  ? ? ?Past Surgical History:  ?Procedure Laterality Date  ? ANTERIOR CERVICAL DECOMPRESSION/DISCECTOMY FUSION 4 LEVELS N/A 12/02/2012  ? Procedure: Cervical three-four, Cervical four-five, Cervical five-six, Cervical six-seven  Anterior cervical decompression/diskectomy/fusion;  Surgeon: Erline Levine, MD;  Location: [facility];  Service: Neurosurgery;  Laterality: N/A;  Cervical three-four, Cervical four-five, Cervical five-six, Cervical six-seven Anterior cervical decompression/diskectomy/fusion  ? KNEE ARTHROSCOPY WITH MEDIAL MENISECTOMY Left 09/01/2018  ? Procedure: KNEE ARTHROSCOPY WITH MEDIAL MENISECTOMY;  Surgeon: Latanya Maudlin, MD;  Location: [facility];  Service: Orthopedics;  Laterality: Left;  41min  ? [place] [facility]  12/20/2015  ? L4-5  ? TOTAL KNEE ARTHROPLASTY Right 01/28/2021  ? Procedure: TOTAL KNEE ARTHROPLASTY;  Surgeon: Gaynelle Arabian, MD;  Location: [facility];  Service: Orthopedics;  Laterality: Right;  ? ? ?Prior to Admission medications   ?Medication Sig Start Date End Date Taking? Authorizing Provider  ?amLODipine (NORVASC) 10 MG tablet Take 10 mg by mouth daily.     [provider]  ?baclofen (LIORESAL) 20 MG tablet Take 20 mg by mouth 2 (two) times daily.    [provider]  ?carvedilol (COREG) 25 MG tablet Take 25 mg by mouth 2 (two) times daily with a meal.    [provider]  ?DULoxetine (CYMBALTA) 30 MG capsule Take 30 mg by mouth 3 (three) times daily.    [provider]  ?gabapentin (NEURONTIN) 800 MG tablet Take 800 mg by mouth 4 (four) times daily.    [provider]  ?hydrochlorothiazide (HYDRODIURIL) 25 MG tablet Take 25 mg by mouth daily.    [provider]  ?losartan (COZAAR) 100 MG tablet Take 100 mg by mouth daily.    [provider]  ?lovastatin (MEVACOR) 40 MG tablet  Take 40 mg by mouth at bedtime.    [provider]  ?oxyCODONE (OXY IR/ROXICODONE) 5 MG immediate release tablet Take 1-2 tablets (5-10 mg total) by mouth every 6 (six) hours as needed for moderate pain or severe pain. 01/29/21   Edmisten, Ok Anis, PA  ?pantoprazole (PROTONIX) 40 MG tablet Take 40 mg by mouth daily.    [provider]   ?tiZANidine (ZANAFLEX) 4 MG capsule Take 4 mg by mouth every 6 (six) hours as needed for muscle spasms.    [provider]  ?VICTOZA 18 MG/3ML SOPN Inject 1.8 mg into the skin daily. 01/08/21   [provider]  ? ? ?Allergies  ?Allergen Reactions  ? Pork-Derived Products   ?  PT DOES NOT EAT PORK  ? ? ?Social History  ? ?Socioeconomic History  ? Marital status: Married  ?  Spouse name: [name]  ? Number of children: 4  ? Years of education: College  ? Highest education level: Not on file  ?Occupational History  ?  Employer: [employer]  ?Tobacco Use  ? Smoking status: Every Day  ?  Packs/day: 0.25  ?  Years: 30.00  ?  Pack years: 7.50  ?  Types: Cigarettes  ? Smokeless tobacco: Never  ?Vaping Use  ? Vaping Use: Never used  ?Substance and Sexual Activity  ? Alcohol use: Never  ? Drug use: Never  ? Sexual activity: Not on file  ?Other Topics Concern  ? Not on file  ?Social History Narrative  ? Lives at home with family.  ? Caffeine Use: 1 cup daily  ? ?Social Determinants of Health  ? ?Financial Resource Strain: Not on file  ?Food Insecurity: Not on file  ?Transportation Needs: Not on file  ?Physical Activity: Not on file  ?Stress: Not on file  ?Social Connections: Not on file  ?Intimate Partner Violence: Not on file  ? ? ?Tobacco Use: High Risk  ? Smoking Tobacco Use: Every Day  ? Smokeless Tobacco Use: Never  ? Passive Exposure: Not on file  ? ?Social History  ? ?Substance and Sexual Activity  ?Alcohol Use Never  ? ? ?No family history on file. ? ?Review of Systems  ?Constitutional:  Negative for chills and fever.  ?HENT: Negative.    ?Eyes: Negative.   ?Respiratory:  Negative for cough and shortness of breath.   ?Cardiovascular:  Negative for chest pain and palpitations.  ?Gastrointestinal:  Negative for abdominal pain, constipation, diarrhea, nausea and vomiting.  ?Genitourinary:  Negative for dysuria, frequency and urgency.  ?Musculoskeletal:  Positive for joint pain.  ?Skin:  Negative for  rash.  ? ?Objective: ? ?Physical Exam: ?Well nourished and well developed.  ?General: Alert and oriented x3, cooperative and pleasant, no acute distress.  ?Head: normocephalic, atraumatic, neck supple.  ?Eyes: EOMI.  ?Respiratory: breath sounds clear in all fields, no wheezing, rales, or rhonchi. ?Cardiovascular: Regular rate and rhythm, no murmurs, gallops or rubs.  ?Abdomen: non-tender to palpation and soft, normoactive bowel sounds. ?Musculoskeletal: ? ?Left knee exam:  ? Varus deformity.  ? No effusion.  ? Range of motion: 5 - 125 degrees.  ? Positive crepitus on range of motion of the knee.  ? No medial or lateral joint line tenderness.  ? Stable knee. ? ?Calves soft and nontender. Motor function intact in LE. Strength 5/5 LE bilaterally. ?Neuro: Distal pulses 2+. Sensation to light touch intact in LE. ? ?Vital signs in last 24 hours: ?BP: ()/()  ?Arterial Line BP: ()/()  ? ?  Imaging Review ?Plain radiographs demonstrate moderate degenerative joint disease of the left knee. The overall alignment is mild varus. The bone quality appears to be adequate for age and reported activity level. ? ?Assessment/Plan: ? ?End stage arthritis, left knee  ? ?The patient history, physical examination, clinical judgment of the provider and imaging studies are consistent with end stage degenerative joint disease of the left knee and total knee arthroplasty is deemed medically necessary. The treatment options including medical management, injection therapy arthroscopy and arthroplasty were discussed at length. The risks and benefits of total knee arthroplasty were presented and reviewed. The risks due to aseptic loosening, infection, stiffness, patella tracking problems, thromboembolic complications and other imponderables were discussed. The patient acknowledged the explanation, agreed to proceed with the plan and consent was signed. Patient is being admitted for inpatient treatment for surgery, pain control, PT, OT, prophylactic  antibiotics, VTE prophylaxis, progressive ambulation and ADLs and discharge planning. The patient is planning to be discharged  home . ? ? ?Patient's anticipated LOS is less than 2 midnights, meeting these re

## 2021-07-29 NOTE — Progress Notes (Addendum)
DUE TO COVID-19 ONLY  2  VISITOR IS ALLOWED TO COME WITH YOU AND STAY IN THE WAITING ROOM ONLY DURING PRE OP AND PROCEDURE DAY OF SURGERY.   4 VISITOR  MAY VISIT WITH YOU AFTER SURGERY IN YOUR PRIVATE ROOM DURING VISITING HOURS ONLY! ?YOU MAY HAVE ONE PERSON SPEND THE NITE WITH YOU IN YOUR ROOM AFTER SURGERY.   ? ? Your procedure is scheduled on:  ?         08/12/21  ? Report to [facility] Main  Entrance ? ? Report to admitting at        0845          AM ?DO NOT BRING INSURANCE CARD, PICTURE ID OR WALLET DAY OF SURGERY.  ?  ? ? Call this number if you have problems the morning of surgery [phone]  ? ? REMEMBER: NO  SOLID FOODS , CANDY, GUM OR MINTS AFTER MIDNITE THE NITE BEFORE SURGERY .       Marland Kitchen CLEAR LIQUIDS UNTIL       0830am          DAY OF SURGERY.      PLEASE FINISH ENSURE DRINK PER SURGEON ORDER  WHICH NEEDS TO BE COMPLETED AT   0830am       MORNING OF SURGERY.   ? ? ? ? ?CLEAR LIQUID DIET ? ? ?Foods Allowed      ?WATER ?BLACK COFFEE ( SUGAR OK, NO MILK, CREAM OR CREAMER) REGULAR AND DECAF  ?TEA ( SUGAR OK NO MILK, CREAM, OR CREAMER) REGULAR AND DECAF  ?PLAIN JELLO ( NO RED)  ?FRUIT ICES ( NO RED, NO FRUIT PULP)  ?POPSICLES ( NO RED)  ?JUICE- APPLE, WHITE GRAPE AND WHITE CRANBERRY  ?SPORT DRINK LIKE GATORADE ( NO RED)  ?CLEAR BROTH ( VEGETABLE , CHICKEN OR BEEF)                                                               ? ?    ? ?BRUSH YOUR TEETH MORNING OF SURGERY AND RINSE YOUR MOUTH OUT, NO CHEWING GUM CANDY OR MINTS. ?  ? ? Take these medicines the morning of surgery with A SIP OF WATER:  coreg, gabapentin  ? ? ?DO NOT TAKE ANY DIABETIC MEDICATIONS DAY OF YOUR SURGERY ?                  ?            You may not have any metal on your body including hair pins and  ?            piercings  Do not wear jewelry, make-up, lotions, powders or perfumes, deodorant ?            Do not wear nail polish on your fingernails.   ?           IF YOU ARE A MALE AND WANT TO SHAVE UNDER ARMS OR LEGS PRIOR TO  SURGERY YOU MUST DO SO AT LEAST 48 HOURS PRIOR TO SURGERY.  ?            Men may shave face and neck. ? ? Do not bring valuables to the hospital. [facility] IS NOT ?  RESPONSIBLE   FOR VALUABLES. ? Contacts, dentures or bridgework may not be worn into surgery. ? Leave suitcase in the car. After surgery it may be brought to your room. ? ?  ? Patients discharged the day of surgery will not be allowed to drive home. IF YOU ARE HAVING SURGERY AND GOING HOME THE SAME DAY, YOU MUST HAVE AN ADULT TO DRIVE YOU HOME AND BE WITH YOU FOR 24 HOURS. YOU MAY GO HOME BY TAXI OR UBER OR ORTHERWISE, BUT AN ADULT MUST ACCOMPANY YOU HOME AND STAY WITH YOU FOR 24 HOURS. ?  ? ?            Please read over the following fact sheets you were given: ?_____________________________________________________________________ ? ? - Preparing for Surgery ?Before surgery, you can play an important role.  Because skin is not sterile, your skin needs to be as free of germs as possible.  You can reduce the number of germs on your skin by washing with CHG (chlorahexidine gluconate) soap before surgery.  CHG is an antiseptic cleaner which kills germs and bonds with the skin to continue killing germs even after washing. ?Please DO NOT use if you have an allergy to CHG or antibacterial soaps.  If your skin becomes reddened/irritated stop using the CHG and inform your nurse when you arrive at Short Stay. ?Do not shave (including legs and underarms) for at least 48 hours prior to the first CHG shower.  You may shave your face/neck. ?Please follow these instructions carefully: ? 1.  Shower with CHG Soap the night before surgery and the  morning of Surgery. ? 2.  If you choose to wash your hair, wash your hair first as usual with your  normal  shampoo. ? 3.  After you shampoo, rinse your hair and body thoroughly to remove the  shampoo.                           4.  Use CHG as you would any other liquid soap.  You can apply chg directly   to the skin and wash  ?                     Gently with a scrungie or clean washcloth. ? 5.  Apply the CHG Soap to your body ONLY FROM THE NECK DOWN.   Do not use on face/ open      ?                     Wound or open sores. Avoid contact with eyes, ears mouth and genitals (private parts).  ?                     Production manager,  Genitals (private parts) with your normal soap. ?            6.  Wash thoroughly, paying special attention to the area where your surgery  will be performed. ? 7.  Thoroughly rinse your body with warm water from the neck down. ? 8.  DO NOT shower/wash with your normal soap after using and rinsing off  the CHG Soap. ?               9.  Pat yourself dry with a clean towel. ?           10.  Wear clean pajamas. ?  11.  Place clean sheets on your bed the night of your first shower and do not  sleep with pets. ?Day of Surgery : ?Do not apply any lotions/deodorants the morning of surgery.  Please wear clean clothes to the hospital/surgery center. ? ?FAILURE TO FOLLOW THESE INSTRUCTIONS MAY RESULT IN THE CANCELLATION OF YOUR SURGERY ?PATIENT SIGNATURE_________________________________ ? ?NURSE SIGNATURE__________________________________ ? ?________________________________________________________________________  ? ? ?           ?

## 2021-07-29 NOTE — Progress Notes (Addendum)
Anesthesia Review: ? ?PCP: Ninetta Lights- LOV 05/01/21  and 07/30/21  [id number]. Clearance dated 07/31/21 on chart. OV 07/25/21 on chart.  ?Cardiologist : none  ?Monitor- 10/29/20  ?Chest x-ray : ?EKG : 07/30/21 - DR Ninetta Lights office is to fax tracing requested on 07/31/21.  On chart.  ?Monitor- 10/29/20  ?Echo : ?Stress test: ?Cardiac Cath :  ?Activity level: can do a flight of stairs without difficulty  ?Sleep Study/ CPAP : none  ?Fasting Blood Sugar :      / Checks Blood Sugar -- times a day:   ?Blood Thinner/ Instructions /Last Dose: ?ASA / Instructions/ Last Dose :   ?Labs of vit b 12 , vitamin d , lipid, cmp, cbc/diff and hgba1c done on 07/31/21.  They are to fax once results received.   ?PT reports at preop appt on 07/31/21 no longer a diabetic.  Nor prediabetes. PT takes Victoza for " somemthing else".  PT reports he has lost 50lbs since knee surgery in 01/2021.   ?Per preop exam note of Dr furr on 07/31/21 pt is still considered prediabetes per note.   ?Labs of cbc/diff, CMP, hgba1c, on chart ?Hgba`1c- 5.1  done 07/31/21.  ?

## 2021-07-31 ENCOUNTER — Encounter (INDEPENDENT_AMBULATORY_CARE_PROVIDER_SITE_OTHER): Payer: Self-pay

## 2021-07-31 ENCOUNTER — Other Ambulatory Visit: Payer: Self-pay

## 2021-07-31 ENCOUNTER — Encounter (HOSPITAL_COMMUNITY): Payer: Self-pay

## 2021-07-31 ENCOUNTER — Encounter (HOSPITAL_COMMUNITY)
Admission: RE | Admit: 2021-07-31 | Discharge: 2021-07-31 | Disposition: A | Discharge status: Home / Self Care | Payer: Medicare Other | Source: Ambulatory Visit | Attending: Orthopedic Surgery | Admitting: Orthopedic Surgery

## 2021-07-31 VITALS — BP 153/81 | HR 66 | Temp 98.2°F | Resp 16 | Ht 66.0 in | Wt 181.0 lb

## 2021-07-31 DIAGNOSIS — Z01818 Encounter for other preprocedural examination: Secondary | ICD-10-CM | POA: Diagnosis present

## 2021-07-31 DIAGNOSIS — M1711 Unilateral primary osteoarthritis, right knee: Secondary | ICD-10-CM | POA: Diagnosis not present

## 2021-07-31 LAB — SURGICAL PCR SCREEN
MRSA, PCR: NEGATIVE
Staphylococcus aureus: NEGATIVE

## 2021-08-12 ENCOUNTER — Ambulatory Visit (HOSPITAL_COMMUNITY): Payer: Medicare Other | Admitting: Physician Assistant

## 2021-08-12 ENCOUNTER — Other Ambulatory Visit: Payer: Self-pay

## 2021-08-12 ENCOUNTER — Encounter (HOSPITAL_COMMUNITY)
Admission: RE | Disposition: A | Discharge status: Home / Self Care | Payer: Self-pay | Source: Ambulatory Visit | Attending: Orthopedic Surgery

## 2021-08-12 ENCOUNTER — Observation Stay (HOSPITAL_COMMUNITY)
Admission: RE | Admit: 2021-08-12 | Discharge: 2021-08-13 | Disposition: A | Discharge status: Home / Self Care | Payer: Medicare Other | Source: Ambulatory Visit | Attending: Orthopedic Surgery | Admitting: Orthopedic Surgery

## 2021-08-12 ENCOUNTER — Encounter (HOSPITAL_COMMUNITY): Payer: Self-pay | Admitting: Orthopedic Surgery

## 2021-08-12 ENCOUNTER — Ambulatory Visit (HOSPITAL_BASED_OUTPATIENT_CLINIC_OR_DEPARTMENT_OTHER): Payer: Medicare Other | Admitting: Anesthesiology

## 2021-08-12 DIAGNOSIS — Z96651 Presence of right artificial knee joint: Secondary | ICD-10-CM | POA: Insufficient documentation

## 2021-08-12 DIAGNOSIS — M1712 Unilateral primary osteoarthritis, left knee: Secondary | ICD-10-CM | POA: Diagnosis present

## 2021-08-12 DIAGNOSIS — F17210 Nicotine dependence, cigarettes, uncomplicated: Secondary | ICD-10-CM | POA: Diagnosis not present

## 2021-08-12 DIAGNOSIS — Z79899 Other long term (current) drug therapy: Secondary | ICD-10-CM | POA: Diagnosis not present

## 2021-08-12 DIAGNOSIS — M1711 Unilateral primary osteoarthritis, right knee: Secondary | ICD-10-CM

## 2021-08-12 DIAGNOSIS — I10 Essential (primary) hypertension: Secondary | ICD-10-CM | POA: Diagnosis not present

## 2021-08-12 HISTORY — PX: TOTAL KNEE ARTHROPLASTY: SHX125

## 2021-08-12 SURGERY — ARTHROPLASTY, KNEE, TOTAL
Anesthesia: General | Site: Knee | Laterality: Left

## 2021-08-12 MED ORDER — PROPOFOL 10 MG/ML IV BOLUS
INTRAVENOUS | Status: DC | PRN
  Administered 2021-08-12: 150 mg via INTRAVENOUS

## 2021-08-12 MED ORDER — ORAL CARE MOUTH RINSE: 15.0000 mL | Freq: Once | OROMUCOSAL | Status: AC

## 2021-08-12 MED ORDER — SODIUM CHLORIDE 0.9 % IR SOLN
Status: DC | PRN
  Administered 2021-08-12 (×2): 1000 mL

## 2021-08-12 MED ORDER — HYDRALAZINE HCL 20 MG/ML IJ SOLN
5.0000 mg | INTRAMUSCULAR | Status: DC | PRN
  Administered 2021-08-12: 5 mg via INTRAVENOUS

## 2021-08-12 MED ORDER — METHOCARBAMOL 500 MG IVPB - SIMPLE MED
500.0000 mg | Freq: Four times a day (QID) | INTRAVENOUS | Status: DC | PRN
  Administered 2021-08-12: 500 mg via INTRAVENOUS

## 2021-08-12 MED ORDER — LACTATED RINGERS IV SOLN: INTRAVENOUS | Status: DC

## 2021-08-12 MED ORDER — POVIDONE-IODINE 10 % EX SWAB
2.0000 "application " | Freq: Once | CUTANEOUS | Status: AC
  Administered 2021-08-12: 2 via TOPICAL

## 2021-08-12 MED ORDER — SODIUM CHLORIDE (PF) 0.9 % IJ SOLN
INTRAMUSCULAR | Status: AC
  Filled 2021-08-12: qty 50

## 2021-08-12 MED ORDER — DOCUSATE SODIUM 100 MG PO CAPS
100.0000 mg | ORAL_CAPSULE | Freq: Two times a day (BID) | ORAL | Status: DC
  Administered 2021-08-12 – 2021-08-13 (×2): 100 mg via ORAL
  Filled 2021-08-12 (×2): qty 1

## 2021-08-12 MED ORDER — METHOCARBAMOL 500 MG IVPB - SIMPLE MED
INTRAVENOUS | Status: AC
  Filled 2021-08-12: qty 50

## 2021-08-12 MED ORDER — MIDAZOLAM HCL 2 MG/2ML IJ SOLN
INTRAMUSCULAR | Status: AC
  Filled 2021-08-12: qty 2

## 2021-08-12 MED ORDER — RIVAROXABAN 10 MG PO TABS
10.0000 mg | ORAL_TABLET | Freq: Every day | ORAL | Status: DC
  Administered 2021-08-13: 10 mg via ORAL
  Filled 2021-08-12: qty 1

## 2021-08-12 MED ORDER — LOSARTAN POTASSIUM 50 MG PO TABS
100.0000 mg | ORAL_TABLET | Freq: Every day | ORAL | Status: DC
  Administered 2021-08-13: 100 mg via ORAL
  Filled 2021-08-12: qty 2

## 2021-08-12 MED ORDER — DEXAMETHASONE SODIUM PHOSPHATE 10 MG/ML IJ SOLN
INTRAMUSCULAR | Status: AC
  Filled 2021-08-12: qty 1

## 2021-08-12 MED ORDER — HYDROMORPHONE HCL 1 MG/ML IJ SOLN: 0.5000 mg | INTRAMUSCULAR | Status: DC | PRN

## 2021-08-12 MED ORDER — MIDAZOLAM HCL 2 MG/2ML IJ SOLN
2.0000 mg | Freq: Once | INTRAMUSCULAR | Status: AC
  Administered 2021-08-12: 2 mg via INTRAVENOUS

## 2021-08-12 MED ORDER — HYDROMORPHONE HCL 2 MG PO TABS
1.0000 mg | ORAL_TABLET | ORAL | Status: DC | PRN
  Administered 2021-08-13: 2 mg via ORAL
  Filled 2021-08-12: qty 1

## 2021-08-12 MED ORDER — FENTANYL CITRATE PF 50 MCG/ML IJ SOSY
50.0000 ug | PREFILLED_SYRINGE | INTRAMUSCULAR | Status: DC
  Filled 2021-08-12: qty 2

## 2021-08-12 MED ORDER — ACETAMINOPHEN 10 MG/ML IV SOLN
1000.0000 mg | Freq: Four times a day (QID) | INTRAVENOUS | Status: DC
  Administered 2021-08-12: 1000 mg via INTRAVENOUS
  Filled 2021-08-12: qty 100

## 2021-08-12 MED ORDER — FENTANYL CITRATE (PF) 100 MCG/2ML IJ SOLN
INTRAMUSCULAR | Status: AC
  Filled 2021-08-12: qty 2

## 2021-08-12 MED ORDER — HYDRALAZINE HCL 20 MG/ML IJ SOLN
INTRAMUSCULAR | Status: AC
  Filled 2021-08-12: qty 1

## 2021-08-12 MED ORDER — ONDANSETRON HCL 4 MG/2ML IJ SOLN: 4.0000 mg | Freq: Four times a day (QID) | INTRAMUSCULAR | Status: DC | PRN

## 2021-08-12 MED ORDER — DIPHENHYDRAMINE HCL 12.5 MG/5ML PO ELIX: 12.5000 mg | ORAL_SOLUTION | ORAL | Status: DC | PRN

## 2021-08-12 MED ORDER — ATROPINE SULFATE 0.4 MG/ML IV SOLN
INTRAVENOUS | Status: DC | PRN
  Administered 2021-08-12: .2 mg via INTRAVENOUS

## 2021-08-12 MED ORDER — LIDOCAINE 2% (20 MG/ML) 5 ML SYRINGE
INTRAMUSCULAR | Status: DC | PRN
  Administered 2021-08-12: 60 mg via INTRAVENOUS

## 2021-08-12 MED ORDER — HYDROMORPHONE HCL 1 MG/ML IJ SOLN
INTRAMUSCULAR | Status: AC
  Administered 2021-08-12: 0.25 mg via INTRAVENOUS
  Filled 2021-08-12: qty 2

## 2021-08-12 MED ORDER — ACETAMINOPHEN 10 MG/ML IV SOLN: 1000.0000 mg | Freq: Once | INTRAVENOUS | Status: DC | PRN

## 2021-08-12 MED ORDER — FENTANYL CITRATE (PF) 100 MCG/2ML IJ SOLN
INTRAMUSCULAR | Status: DC | PRN
  Administered 2021-08-12 (×4): 50 ug via INTRAVENOUS

## 2021-08-12 MED ORDER — BUPIVACAINE LIPOSOME 1.3 % IJ SUSP
INTRAMUSCULAR | Status: AC
  Filled 2021-08-12: qty 20

## 2021-08-12 MED ORDER — MENTHOL 3 MG MT LOZG: 1.0000 | LOZENGE | OROMUCOSAL | Status: DC | PRN

## 2021-08-12 MED ORDER — DULOXETINE HCL 30 MG PO CPEP
30.0000 mg | ORAL_CAPSULE | Freq: Three times a day (TID) | ORAL | Status: DC
  Administered 2021-08-12 – 2021-08-13 (×2): 30 mg via ORAL
  Filled 2021-08-12 (×3): qty 1

## 2021-08-12 MED ORDER — TRANEXAMIC ACID-NACL 1000-0.7 MG/100ML-% IV SOLN
1000.0000 mg | INTRAVENOUS | Status: AC
  Administered 2021-08-12: 1000 mg via INTRAVENOUS
  Filled 2021-08-12: qty 100

## 2021-08-12 MED ORDER — METOCLOPRAMIDE HCL 5 MG/ML IJ SOLN: 5.0000 mg | Freq: Three times a day (TID) | INTRAMUSCULAR | Status: DC | PRN

## 2021-08-12 MED ORDER — DEXAMETHASONE SODIUM PHOSPHATE 10 MG/ML IJ SOLN
8.0000 mg | Freq: Once | INTRAMUSCULAR | Status: AC
  Administered 2021-08-12: 8 mg via INTRAVENOUS

## 2021-08-12 MED ORDER — POLYETHYLENE GLYCOL 3350 17 G PO PACK: 17.0000 g | PACK | Freq: Every day | ORAL | Status: DC | PRN

## 2021-08-12 MED ORDER — ONDANSETRON HCL 4 MG PO TABS: 4.0000 mg | ORAL_TABLET | Freq: Four times a day (QID) | ORAL | Status: DC | PRN

## 2021-08-12 MED ORDER — METOCLOPRAMIDE HCL 5 MG PO TABS: 5.0000 mg | ORAL_TABLET | Freq: Three times a day (TID) | ORAL | Status: DC | PRN

## 2021-08-12 MED ORDER — FENTANYL CITRATE PF 50 MCG/ML IJ SOSY
50.0000 ug | PREFILLED_SYRINGE | Freq: Once | INTRAMUSCULAR | Status: AC
  Administered 2021-08-12: 50 ug via INTRAVENOUS

## 2021-08-12 MED ORDER — SODIUM CHLORIDE 0.9 % IV SOLN: INTRAVENOUS | Status: DC

## 2021-08-12 MED ORDER — ACETAMINOPHEN 325 MG PO TABS: 325.0000 mg | ORAL_TABLET | Freq: Once | ORAL | Status: DC | PRN

## 2021-08-12 MED ORDER — HYDROMORPHONE HCL 2 MG PO TABS
2.0000 mg | ORAL_TABLET | ORAL | Status: DC | PRN
  Administered 2021-08-12: 2 mg via ORAL
  Filled 2021-08-12: qty 1

## 2021-08-12 MED ORDER — BUPIVACAINE LIPOSOME 1.3 % IJ SUSP: 20.0000 mL | Freq: Once | INTRAMUSCULAR | Status: DC

## 2021-08-12 MED ORDER — BUPIVACAINE LIPOSOME 1.3 % IJ SUSP
INTRAMUSCULAR | Status: DC | PRN
  Administered 2021-08-12: 20 mL

## 2021-08-12 MED ORDER — CEFAZOLIN SODIUM-DEXTROSE 2-4 GM/100ML-% IV SOLN
2.0000 g | Freq: Four times a day (QID) | INTRAVENOUS | Status: AC
  Administered 2021-08-12 (×2): 2 g via INTRAVENOUS
  Filled 2021-08-12 (×2): qty 100

## 2021-08-12 MED ORDER — TIZANIDINE HCL 4 MG PO TABS
4.0000 mg | ORAL_TABLET | Freq: Four times a day (QID) | ORAL | Status: DC | PRN
  Administered 2021-08-12: 4 mg via ORAL
  Filled 2021-08-12: qty 1

## 2021-08-12 MED ORDER — DEXAMETHASONE SODIUM PHOSPHATE 10 MG/ML IJ SOLN
10.0000 mg | Freq: Once | INTRAMUSCULAR | Status: AC
  Administered 2021-08-13: 10 mg via INTRAVENOUS
  Filled 2021-08-12: qty 1

## 2021-08-12 MED ORDER — CARVEDILOL 25 MG PO TABS
25.0000 mg | ORAL_TABLET | Freq: Two times a day (BID) | ORAL | Status: DC
  Administered 2021-08-12 – 2021-08-13 (×2): 25 mg via ORAL
  Filled 2021-08-12 (×2): qty 1

## 2021-08-12 MED ORDER — ACETAMINOPHEN 160 MG/5ML PO SOLN: 325.0000 mg | Freq: Once | ORAL | Status: DC | PRN

## 2021-08-12 MED ORDER — CEFAZOLIN SODIUM-DEXTROSE 2-4 GM/100ML-% IV SOLN
2.0000 g | INTRAVENOUS | Status: AC
  Administered 2021-08-12: 2 g via INTRAVENOUS
  Filled 2021-08-12: qty 100

## 2021-08-12 MED ORDER — FLEET ENEMA 7-19 GM/118ML RE ENEM: 1.0000 | ENEMA | Freq: Once | RECTAL | Status: DC | PRN

## 2021-08-12 MED ORDER — MIDAZOLAM HCL 2 MG/2ML IJ SOLN
1.0000 mg | INTRAMUSCULAR | Status: DC
  Filled 2021-08-12: qty 2

## 2021-08-12 MED ORDER — SUCCINYLCHOLINE CHLORIDE 200 MG/10ML IV SOSY
PREFILLED_SYRINGE | INTRAVENOUS | Status: DC | PRN
Start: 2021-08-12 — End: 2021-08-12
  Administered 2021-08-12: 100 mg via INTRAVENOUS

## 2021-08-12 MED ORDER — MEPERIDINE HCL 50 MG/ML IJ SOLN: 6.2500 mg | INTRAMUSCULAR | Status: DC | PRN

## 2021-08-12 MED ORDER — ATROPINE SULFATE 1 MG/10ML IJ SOSY
PREFILLED_SYRINGE | INTRAMUSCULAR | Status: AC
  Filled 2021-08-12: qty 20

## 2021-08-12 MED ORDER — GABAPENTIN 400 MG PO CAPS
800.0000 mg | ORAL_CAPSULE | Freq: Three times a day (TID) | ORAL | Status: DC
  Administered 2021-08-12 – 2021-08-13 (×2): 800 mg via ORAL
  Filled 2021-08-12 (×3): qty 2

## 2021-08-12 MED ORDER — SODIUM CHLORIDE (PF) 0.9 % IJ SOLN
INTRAMUSCULAR | Status: AC
  Filled 2021-08-12: qty 10

## 2021-08-12 MED ORDER — AMISULPRIDE (ANTIEMETIC) 5 MG/2ML IV SOLN: 10.0000 mg | Freq: Once | INTRAVENOUS | Status: DC | PRN

## 2021-08-12 MED ORDER — SODIUM CHLORIDE (PF) 0.9 % IJ SOLN
INTRAMUSCULAR | Status: DC | PRN
  Administered 2021-08-12: 60 mL

## 2021-08-12 MED ORDER — GLYCOPYRROLATE 0.2 MG/ML IJ SOLN
INTRAMUSCULAR | Status: DC | PRN
  Administered 2021-08-12 (×2): .2 mg via INTRAVENOUS

## 2021-08-12 MED ORDER — ACETAMINOPHEN 500 MG PO TABS
1000.0000 mg | ORAL_TABLET | Freq: Four times a day (QID) | ORAL | Status: DC
  Administered 2021-08-12 – 2021-08-13 (×3): 1000 mg via ORAL
  Filled 2021-08-12 (×3): qty 2

## 2021-08-12 MED ORDER — PHENOL 1.4 % MT LIQD: 1.0000 | OROMUCOSAL | Status: DC | PRN

## 2021-08-12 MED ORDER — HYDROMORPHONE HCL 1 MG/ML IJ SOLN
0.2500 mg | INTRAMUSCULAR | Status: DC | PRN
  Administered 2021-08-12: 0.25 mg via INTRAVENOUS
  Administered 2021-08-12 (×2): 0.5 mg via INTRAVENOUS

## 2021-08-12 MED ORDER — PANTOPRAZOLE SODIUM 40 MG PO TBEC
40.0000 mg | DELAYED_RELEASE_TABLET | Freq: Every evening | ORAL | Status: DC
Start: 2021-08-12 — End: 2021-08-13
  Administered 2021-08-12: 40 mg via ORAL
  Filled 2021-08-12: qty 1

## 2021-08-12 MED ORDER — ROPIVACAINE HCL 5 MG/ML IJ SOLN
INTRAMUSCULAR | Status: DC | PRN
  Administered 2021-08-12: 30 mL via PERINEURAL

## 2021-08-12 MED ORDER — CHLORHEXIDINE GLUCONATE 0.12 % MT SOLN
15.0000 mL | Freq: Once | OROMUCOSAL | Status: AC
  Administered 2021-08-12: 15 mL via OROMUCOSAL

## 2021-08-12 MED ORDER — MIDAZOLAM HCL 5 MG/5ML IJ SOLN
INTRAMUSCULAR | Status: DC | PRN
  Administered 2021-08-12 (×2): .5 mg via INTRAVENOUS

## 2021-08-12 MED ORDER — PRAVASTATIN SODIUM 20 MG PO TABS
40.0000 mg | ORAL_TABLET | Freq: Every day | ORAL | Status: DC
Start: 2021-08-13 — End: 2021-08-13

## 2021-08-12 MED ORDER — BISACODYL 10 MG RE SUPP: 10.0000 mg | Freq: Every day | RECTAL | Status: DC | PRN

## 2021-08-12 SURGICAL SUPPLY — 58 items
ATTUNE MED DOME PAT 38 KNEE (Knees) ×1 IMPLANT
ATTUNE PS FEM LT SZ 7 CEM KNEE (Femur) ×1 IMPLANT
ATTUNE PSRP INSR SZ7 8 KNEE (Insert) ×1 IMPLANT
BAG COUNTER SPONGE SURGICOUNT (BAG) IMPLANT
BAG SPEC THK2 15X12 ZIP CLS (MISCELLANEOUS) ×1
BAG SPNG CNTER NS LX DISP (BAG)
BAG ZIPLOCK 12X15 (MISCELLANEOUS) ×3 IMPLANT
BASE TIBIA ATTUNE KNEE SYS SZ6 (Knees) IMPLANT
BLADE SAG 18X100X1.27 (BLADE) ×3 IMPLANT
BLADE SAW SGTL 11.0X1.19X90.0M (BLADE) ×3 IMPLANT
BNDG ELASTIC 6X5.8 VLCR STR LF (GAUZE/BANDAGES/DRESSINGS) ×3 IMPLANT
BOWL SMART MIX CTS (DISPOSABLE) ×3 IMPLANT
BSPLAT TIB 6 CMNT ROT PLAT STR (Knees) ×1 IMPLANT
CEMENT HV SMART SET (Cement) ×6 IMPLANT
COVER SURGICAL LIGHT HANDLE (MISCELLANEOUS) ×3 IMPLANT
CUFF TOURN SGL QUICK 34 (TOURNIQUET CUFF) ×2
CUFF TRNQT CYL 34X4.125X (TOURNIQUET CUFF) ×2 IMPLANT
DRAPE INCISE IOBAN 66X45 STRL (DRAPES) ×3 IMPLANT
DRAPE U-SHAPE 47X51 STRL (DRAPES) ×3 IMPLANT
DRSG AQUACEL AG ADV 3.5X10 (GAUZE/BANDAGES/DRESSINGS) ×3 IMPLANT
DURAPREP 26ML APPLICATOR (WOUND CARE) ×3 IMPLANT
ELECT REM PT RETURN 15FT ADLT (MISCELLANEOUS) ×3 IMPLANT
GLOVE BIO SURGEON STRL SZ 6.5 (GLOVE) ×3 IMPLANT
GLOVE BIO SURGEON STRL SZ7.5 (GLOVE) IMPLANT
GLOVE BIO SURGEON STRL SZ8 (GLOVE) ×3 IMPLANT
GLOVE BIOGEL PI IND STRL 6.5 (GLOVE) IMPLANT
GLOVE BIOGEL PI IND STRL 7.0 (GLOVE) IMPLANT
GLOVE BIOGEL PI IND STRL 8 (GLOVE) ×2 IMPLANT
GLOVE BIOGEL PI INDICATOR 6.5 (GLOVE)
GLOVE BIOGEL PI INDICATOR 7.0 (GLOVE) ×3
GLOVE BIOGEL PI INDICATOR 8 (GLOVE) ×1
GOWN STRL REUS W/ TWL LRG LVL3 (GOWN DISPOSABLE) ×2 IMPLANT
GOWN STRL REUS W/ TWL XL LVL3 (GOWN DISPOSABLE) IMPLANT
GOWN STRL REUS W/TWL LRG LVL3 (GOWN DISPOSABLE) ×2
GOWN STRL REUS W/TWL XL LVL3 (GOWN DISPOSABLE)
HANDPIECE INTERPULSE COAX TIP (DISPOSABLE) ×2
HOLDER FOLEY CATH W/STRAP (MISCELLANEOUS) IMPLANT
IMMOBILIZER KNEE 20 (SOFTGOODS) ×2
IMMOBILIZER KNEE 20 THIGH 36 (SOFTGOODS) ×2 IMPLANT
KIT TURNOVER KIT A (KITS) IMPLANT
MANIFOLD NEPTUNE II (INSTRUMENTS) ×3 IMPLANT
NS IRRIG 1000ML POUR BTL (IV SOLUTION) ×3 IMPLANT
PACK TOTAL KNEE CUSTOM (KITS) ×3 IMPLANT
PADDING CAST COTTON 6X4 STRL (CAST SUPPLIES) ×5 IMPLANT
PROTECTOR NERVE ULNAR (MISCELLANEOUS) ×3 IMPLANT
SET HNDPC FAN SPRY TIP SCT (DISPOSABLE) ×2 IMPLANT
SPIKE FLUID TRANSFER (MISCELLANEOUS) ×2 IMPLANT
STRIP CLOSURE SKIN 1/2X4 (GAUZE/BANDAGES/DRESSINGS) ×6 IMPLANT
SUT MNCRL AB 4-0 PS2 18 (SUTURE) ×3 IMPLANT
SUT STRATAFIX 0 PDS 27 VIOLET (SUTURE) ×2
SUT VIC AB 2-0 CT1 27 (SUTURE) ×6
SUT VIC AB 2-0 CT1 TAPERPNT 27 (SUTURE) ×6 IMPLANT
SUTURE STRATFX 0 PDS 27 VIOLET (SUTURE) ×2 IMPLANT
TIBIA ATTUNE KNEE SYS BASE SZ6 (Knees) ×2 IMPLANT
TRAY FOLEY MTR SLVR 16FR STAT (SET/KITS/TRAYS/PACK) ×2 IMPLANT
TUBE SUCTION HIGH CAP CLEAR NV (SUCTIONS) ×3 IMPLANT
WATER STERILE IRR 1000ML POUR (IV SOLUTION) ×6 IMPLANT
WRAP KNEE MAXI GEL POST OP (GAUZE/BANDAGES/DRESSINGS) ×3 IMPLANT

## 2021-08-12 NOTE — Anesthesia Procedure Notes (Signed)
Anesthesia Regional Block: Adductor canal block   Pre-Anesthetic Checklist: , timeout performed,  Correct Patient, Correct Site, Correct Laterality,  Correct Procedure, Correct Position, site marked,  Risks and benefits discussed,  Surgical consent,  Pre-op evaluation,  At surgeon's request and post-op pain management  Laterality: Left  Prep: chloraprep       Needles:  Injection technique: Single-shot  Needle Type: Echogenic Stimulator Needle     Needle Length: 9cm  Needle Gauge: 21     Additional Needles:   Procedures:,,,, ultrasound used (permanent image in chart),,    Narrative:  Start time: 08/12/2021 10:45 AM End time: 08/12/2021 10:50 AM Injection made incrementally with aspirations every 5 mL.  Performed by: Personally  Anesthesiologist: Shelton Silvas, MD  Additional Notes: Patient tolerated the procedure well. Local anesthetic introduced in an incremental fashion under minimal resistance after negative aspirations. No paresthesias were elicited. After completion of the procedure, no acute issues were identified and patient continued to be monitored by RN.

## 2021-08-12 NOTE — Anesthesia Preprocedure Evaluation (Addendum)
Anesthesia Evaluation  Patient identified by MRN, date of birth, ID band Patient awake    Reviewed: Allergy & Precautions, NPO status , Patient's Chart, lab work & pertinent test results  Airway Mallampati: II  TM Distance: >3 FB Neck ROM: Full    Dental  (+) Teeth Intact, Dental Advisory Given   Pulmonary Current Smoker,    breath sounds clear to auscultation       Cardiovascular hypertension, Pt. on home beta blockers and Pt. on medications  Rhythm:Regular Rate:Normal     Neuro/Psych negative psych ROS   GI/Hepatic GERD  Medicated,  Endo/Other    Renal/GU Renal disease     Musculoskeletal  (+) Arthritis ,   Abdominal Normal abdominal exam  (+)   Peds  Hematology   Anesthesia Other Findings   Reproductive/Obstetrics                            Anesthesia Physical Anesthesia Plan  ASA: 2  Anesthesia Plan: General   Post-op Pain Management: Regional block*   Induction: Intravenous  PONV Risk Score and Plan: 2 and Ondansetron and Midazolam  Airway Management Planned: Oral ETT  Additional Equipment: None  Intra-op Plan:   Post-operative Plan: Extubation in OR  Informed Consent: I have reviewed the patients History and Physical, chart, labs and discussed the procedure including the risks, benefits and alternatives for the proposed anesthesia with the patient or authorized representative who has indicated his/her understanding and acceptance.     Dental advisory given  Plan Discussed with: CRNA  Anesthesia Plan Comments: (Lab Results      Component                Value               Date                      WBC                      10.4                01/29/2021                HGB                      12.9 (L)            01/29/2021                HCT                      37.9 (L)            01/29/2021                MCV                      83.1                01/29/2021                 PLT                      210                 01/29/2021           )  Anesthesia Quick Evaluation  

## 2021-08-12 NOTE — Anesthesia Procedure Notes (Addendum)
Procedure Name: Intubation Date/Time: 08/12/2021 11:52 AM Performed by: Gean Maidens, CRNA Pre-anesthesia Checklist: Emergency Drugs available, Patient identified, Suction available, Patient being monitored and Timeout performed Patient Re-evaluated:Patient Re-evaluated prior to induction Oxygen Delivery Method: Circle system utilized Preoxygenation: Pre-oxygenation with 100% oxygen Induction Type: IV induction Ventilation: Mask ventilation without difficulty Laryngoscope Size: Glidescope and 4 Grade View: Grade I Tube type: Oral Tube size: 7.5 mm Number of attempts: 1 Airway Equipment and Method: Video-laryngoscopy Placement Confirmation: ETT inserted through vocal cords under direct vision, positive ETCO2 and breath sounds checked- equal and bilateral Secured at: 23 cm Tube secured with: Tape Dental Injury: Teeth and Oropharynx as per pre-operative assessment

## 2021-08-12 NOTE — Discharge Instructions (Addendum)
 Frank Aluisio, MD Total Joint Specialist [facility] [address] [place], [place] [address] [phone]  TOTAL KNEE REPLACEMENT POSTOPERATIVE DIRECTIONS    Knee Rehabilitation, Guidelines Following Surgery  Results after knee surgery are often greatly improved when you follow the exercise, range of motion and muscle strengthening exercises prescribed by your doctor. Safety measures are also important to protect the knee from further injury. If any of these exercises cause you to have increased pain or swelling in your knee joint, decrease the amount until you are comfortable again and slowly increase them. If you have problems or questions, call your caregiver or physical therapist for advice.   BLOOD CLOT PREVENTION Take a 10 mg Xarelto once a day for three weeks following surgery. Then take an 81 mg Aspirin once a day for three weeks. Then discontinue Aspirin. You may resume your vitamins/supplements once you have discontinued the Xarelto. Do not take any NSAIDs (Advil, Aleve, Ibuprofen, Meloxicam, etc.) until you have discontinued the Xarelto.   HOME CARE INSTRUCTIONS  Remove items at home which could result in a fall. This includes throw rugs or furniture in walking pathways.  ICE to the affected knee as much as tolerated. Icing helps control swelling. If the swelling is well controlled you will be more comfortable and rehab easier. Continue to use ice on the knee for pain and swelling from surgery. You may notice swelling that will progress down to the foot and ankle. This is normal after surgery. Elevate the leg when you are not up walking on it.    Continue to use the breathing machine which will help keep your temperature down. It is common for your temperature to cycle up and down following surgery, especially at night when you are not up moving around and exerting yourself. The breathing machine keeps your lungs expanded and your temperature  down. Do not place pillow under the operative knee, focus on keeping the knee straight while resting  DIET You may resume your previous home diet once you are discharged from the hospital.  DRESSING / WOUND CARE / SHOWERING Keep your bulky bandage on for 2 days. On the third post-operative day you may remove the Ace bandage and gauze. There is a waterproof adhesive bandage on your skin which will stay in place until your first follow-up appointment. Once you remove this you will not need to place another bandage You may begin showering 3 days following surgery, but do not submerge the incision under water.  ACTIVITY For the first 5 days, the key is rest and control of pain and swelling Do your home exercises twice a day starting on post-operative day 3. On the days you go to physical therapy, just do the home exercises once that day. You should rest, ice and elevate the leg for 50 minutes out of every hour. Get up and walk/stretch for 10 minutes per hour. After 5 days you can increase your activity slowly as tolerated. Walk with your walker as instructed. Use the walker until you are comfortable transitioning to a cane. Walk with the cane in the opposite hand of the operative leg. You may discontinue the cane once you are comfortable and walking steadily. Avoid periods of inactivity such as sitting longer than an hour when not asleep. This helps prevent blood clots.  You may discontinue the knee immobilizer once you are able to perform a straight leg raise while lying down. You may resume a sexual relationship in one month or   when given the OK by your doctor.  You may return to work once you are cleared by your doctor.  Do not drive a car for 6 weeks or until released by your surgeon.  Do not drive while taking narcotics.  TED HOSE STOCKINGS Wear the elastic stockings on both legs for three weeks following surgery during the day. You may remove them at night for sleeping.  WEIGHT  BEARING Weight bearing as tolerated with assist device (walker, cane, etc) as directed, use it as long as suggested by your surgeon or therapist, typically at least 4-6 weeks.  POSTOPERATIVE CONSTIPATION PROTOCOL Constipation - defined medically as fewer than three stools per week and severe constipation as less than one stool per week.  One of the most common issues patients have following surgery is constipation.  Even if you have a regular bowel pattern at home, your normal regimen is likely to be disrupted due to multiple reasons following surgery.  Combination of anesthesia, postoperative narcotics, change in appetite and fluid intake all can affect your bowels.  In order to avoid complications following surgery, here are some recommendations in order to help you during your recovery period.  Colace (docusate) - Pick up an over-the-counter form of Colace or another stool softener and take twice a day as long as you are requiring postoperative pain medications.  Take with a full glass of water daily.  If you experience loose stools or diarrhea, hold the colace until you stool forms back up. If your symptoms do not get better within 1 week or if they get worse, check with your doctor. Dulcolax (bisacodyl) - Pick up over-the-counter and take as directed by the product packaging as needed to assist with the movement of your bowels.  Take with a full glass of water.  Use this product as needed if not relieved by Colace only.  MiraLax (polyethylene glycol) - Pick up over-the-counter to have on hand. MiraLax is a solution that will increase the amount of water in your bowels to assist with bowel movements.  Take as directed and can mix with a glass of water, juice, soda, coffee, or tea. Take if you go more than two days without a movement. Do not use MiraLax more than once per day. Call your doctor if you are still constipated or irregular after using this medication for 7 days in a row.  If you continue  to have problems with postoperative constipation, please contact the office for further assistance and recommendations.  If you experience "the worst abdominal pain ever" or develop nausea or vomiting, please contact the office immediatly for further recommendations for treatment.  ITCHING If you experience itching with your medications, try taking only a single pain pill, or even half a pain pill at a time.  You can also use Benadryl over the counter for itching or also to help with sleep.   MEDICATIONS See your medication summary on the "After Visit Summary" that the nursing staff will review with you prior to discharge.  You may have some home medications which will be placed on hold until you complete the course of blood thinner medication.  It is important for you to complete the blood thinner medication as prescribed by your surgeon.  Continue your approved medications as instructed at time of discharge.  PRECAUTIONS If you experience chest pain or shortness of breath - call 911 immediately for transfer to the hospital emergency department.  If you develop a fever greater that 101 F, purulent   drainage from wound, increased redness or drainage from wound, foul odor from the wound/dressing, or calf pain - CONTACT YOUR SURGEON.                                                   FOLLOW-UP APPOINTMENTS Make sure you keep all of your appointments after your operation with your surgeon and caregivers. You should call the office at the above phone number and make an appointment for approximately two weeks after the date of your surgery or on the date instructed by your surgeon outlined in the "After Visit Summary".  RANGE OF MOTION AND STRENGTHENING EXERCISES  Rehabilitation of the knee is important following a knee injury or an operation. After just a few days of immobilization, the muscles of the thigh which control the knee become weakened and shrink (atrophy). Knee exercises are designed to build up  the tone and strength of the thigh muscles and to improve knee motion. Often times heat used for twenty to thirty minutes before working out will loosen up your tissues and help with improving the range of motion but do not use heat for the first two weeks following surgery. These exercises can be done on a training (exercise) mat, on the floor, on a table or on a bed. Use what ever works the best and is most comfortable for you Knee exercises include:  Leg Lifts - While your knee is still immobilized in a splint or cast, you can do straight leg raises. Lift the leg to 60 degrees, hold for 3 sec, and slowly lower the leg. Repeat 10-20 times 2-3 times daily. Perform this exercise against resistance later as your knee gets better.  Quad and Hamstring Sets - Tighten up the muscle on the front of the thigh (Quad) and hold for 5-10 sec. Repeat this 10-20 times hourly. Hamstring sets are done by pushing the foot backward against an object and holding for 5-10 sec. Repeat as with quad sets.  Leg Slides: Lying on your back, slowly slide your foot toward your buttocks, bending your knee up off the floor (only go as far as is comfortable). Then slowly slide your foot back down until your leg is flat on the floor again. Angel Wings: Lying on your back spread your legs to the side as far apart as you can without causing discomfort.  A rehabilitation program following serious knee injuries can speed recovery and prevent re-injury in the future due to weakened muscles. Contact your doctor or a physical therapist for more information on knee rehabilitation.   POST-OPERATIVE OPIOID TAPER INSTRUCTIONS: It is important to wean off of your opioid medication as soon as possible. If you do not need pain medication after your surgery it is ok to stop day one. Opioids include: Codeine, Hydrocodone(Norco, Vicodin), Oxycodone(Percocet, oxycontin) and hydromorphone amongst others.  Long term and even short term use of opiods can  cause: Increased pain response Dependence Constipation Depression Respiratory depression And more.  Withdrawal symptoms can include Flu like symptoms Nausea, vomiting And more Techniques to manage these symptoms Hydrate well Eat regular healthy meals Stay active Use relaxation techniques(deep breathing, meditating, yoga) Do Not substitute Alcohol to help with tapering If you have been on opioids for less than two weeks and do not have pain than it is ok to stop all together.  Plan to   wean off of opioids This plan should start within one week post op of your joint replacement. Maintain the same interval or time between taking each dose and first decrease the dose.  Cut the total daily intake of opioids by one tablet each day Next start to increase the time between doses. The last dose that should be eliminated is the evening dose.   IF YOU ARE TRANSFERRED TO A SKILLED REHAB FACILITY If the patient is transferred to a skilled rehab facility following release from the hospital, a list of the current medications will be sent to the facility for the patient to continue.  When discharged from the skilled rehab facility, please have the facility set up the patient's Home Health Physical Therapy prior to being released. Also, the skilled facility will be responsible for providing the patient with their medications at time of release from the facility to include their pain medication, the muscle relaxants, and their blood thinner medication. If the patient is still at the rehab facility at time of the two week follow up appointment, the skilled rehab facility will also need to assist the patient in arranging follow up appointment in our office and any transportation needs.  MAKE SURE YOU:  Understand these instructions.  Get help right away if you are not doing well or get worse.   DENTAL ANTIBIOTICS:  In most cases prophylactic antibiotics for Dental procdeures after total joint surgery are  not necessary.  Exceptions are as follows:  1. History of prior total joint infection  2. Severely immunocompromised (Organ Transplant, cancer chemotherapy, Rheumatoid biologic meds such as Humera)  3. Poorly controlled diabetes (A1C &gt; 8.0, blood glucose over 200)  If you have one of these conditions, contact your surgeon for an antibiotic prescription, prior to your dental procedure.    Pick up stool softner and laxative for home use following surgery while on pain medications. Do not submerge incision under water. Please use good hand washing techniques while changing dressing each day. May shower starting three days after surgery. Please use a clean towel to pat the incision dry following showers. Continue to use ice for pain and swelling after surgery. Do not use any lotions or creams on the incision until instructed by your surgeon.  Information on my medicine - XARELTO (Rivaroxaban)  Why was Xarelto prescribed for you? Xarelto was prescribed for you to reduce the risk of blood clots forming after orthopedic surgery. The medical term for these abnormal blood clots is venous thromboembolism (VTE).  What do you need to know about xarelto ? Take your Xarelto ONCE DAILY at the same time every day. You may take it either with or without food.  If you have difficulty swallowing the tablet whole, you may crush it and mix in applesauce just prior to taking your dose.  Take Xarelto exactly as prescribed by your doctor and DO NOT stop taking Xarelto without talking to the doctor who prescribed the medication.  Stopping without other VTE prevention medication to take the place of Xarelto may increase your risk of developing a clot.  After discharge, you should have regular check-up appointments with your healthcare provider that is prescribing your Xarelto.    What do you do if you miss a dose? If you miss a dose, take it as soon as you remember on the same day then  continue your regularly scheduled once daily regimen the next day. Do not take two doses of Xarelto on the same day.   Important   Safety Information A possible side effect of Xarelto is bleeding. You should call your healthcare provider right away if you experience any of the following: Bleeding from an injury or your nose that does not stop. Unusual colored urine (red or dark brown) or unusual colored stools (red or black). Unusual bruising for unknown reasons. A serious fall or if you hit your head (even if there is no bleeding).  Some medicines may interact with Xarelto and might increase your risk of bleeding while on Xarelto. To help avoid this, consult your healthcare provider or pharmacist prior to using any new prescription or non-prescription medications, including herbals, vitamins, non-steroidal anti-inflammatory drugs (NSAIDs) and supplements.  This website has more information on Xarelto: www.xarelto.com.    

## 2021-08-12 NOTE — Op Note (Signed)
OPERATIVE REPORT-TOTAL KNEE ARTHROPLASTY   Pre-operative diagnosis- Osteoarthritis  Left knee(s)  Post-operative diagnosis- Osteoarthritis Left knee(s)  Procedure-  Left  Total Knee Arthroplasty  Surgeon- Gus Rankin. Finnick Orosz, MD  Assistant- Arcola Jansky, PA-C   Anesthesia-   Adductor canal block and spinal  EBL-25 mL   Drains None  Tourniquet time-  Total Tourniquet Time Documented: Thigh (Left) - 40 minutes Total: Thigh (Left) - 40 minutes     Complications- None  Condition-PACU - hemodynamically stable.   Brief Clinical Note   Christian [name] is a 61 y.o. year old male with end stage OA of his left knee with progressively worsening pain and dysfunction. He has constant pain, with activity and at rest and significant functional deficits with difficulties even with ADLs. He has had extensive non-op management including analgesics, injections of cortisone and home exercise program, but remains in significant pain with significant dysfunction. Radiographs show bone on bone arthritis medial and patellofemoral. He presents now for left Total Knee Arthroplasty.     Procedure in detail---   The patient is brought into the operating room and positioned supine on the operating table. After successful administration of  Adductor canal block and spinal,   a tourniquet is placed high on the  Left thigh(s) and the lower extremity is prepped and draped in the usual sterile fashion. Time out is performed by the operating team and then the  Left lower extremity is wrapped in Esmarch, knee flexed and the tourniquet inflated to 300 mmHg.       A midline incision is made with a ten blade through the subcutaneous tissue to the level of the extensor mechanism. A fresh blade is used to make a medial parapatellar arthrotomy. Soft tissue over the proximal medial tibia is subperiosteally elevated to the joint line with a knife and into the semimembranosus bursa with a Cobb elevator. Soft tissue over the  proximal lateral tibia is elevated with attention being paid to avoiding the patellar tendon on the tibial tubercle. The patella is everted, knee flexed 90 degrees and the ACL and PCL are removed. Findings are bone on bone medial and patellofemoral with large global osteophytes        The drill is used to create a starting hole in the distal femur and the canal is thoroughly irrigated with sterile saline to remove the fatty contents. The 5 degree Left  valgus alignment guide is placed into the femoral canal and the distal femoral cutting block is pinned to remove 9 mm off the distal femur. Resection is made with an oscillating saw.      The tibia is subluxed forward and the menisci are removed. The extramedullary alignment guide is placed referencing proximally at the medial aspect of the tibial tubercle and distally along the second metatarsal axis and tibial crest. The block is pinned to remove 51mm off the more deficient medial  side. Resection is made with an oscillating saw. Size 6is the most appropriate size for the tibia and the proximal tibia is prepared with the modular drill and keel punch for that size.      The femoral sizing guide is placed and size 7 is most appropriate. Rotation is marked off the epicondylar axis and confirmed by creating a rectangular flexion gap at 90 degrees. The size 7 cutting block is pinned in this rotation and the anterior, posterior and chamfer cuts are made with the oscillating saw. The intercondylar block is then placed and that cut is made.  Trial size 6 tibial component, trial size 7 posterior stabilized femur and a 8  mm posterior stabilized rotating platform insert trial is placed. Full extension is achieved with excellent varus/valgus and anterior/posterior balance throughout full range of motion. The patella is everted and thickness measured to be 24  mm. Free hand resection is taken to 14 mm, a 38 template is placed, lug holes are drilled, trial patella is  placed, and it tracks normally. Osteophytes are removed off the posterior femur with the trial in place. All trials are removed and the cut bone surfaces prepared with pulsatile lavage. Cement is mixed and once ready for implantation, the size 6 tibial implant, size  7 posterior stabilized femoral component, and the size 38 patella are cemented in place and the patella is held with the clamp. The trial insert is placed and the knee held in full extension. The Exparel (20 ml mixed with 60 ml saline) is injected into the extensor mechanism, posterior capsule, medial and lateral gutters and subcutaneous tissues.  All extruded cement is removed and once the cement is hard the permanent 8 mm posterior stabilized rotating platform insert is placed into the tibial tray.      The wound is copiously irrigated with saline solution and the extensor mechanism closed with # 0 Stratofix suture. The tourniquet is released for a total tourniquet time of 40  minutes. Flexion against gravity is 130 degrees and the patella tracks normally. Subcutaneous tissue is closed with 2.0 vicryl and subcuticular with running 4.0 Monocryl. The incision is cleaned and dried and steri-strips and a bulky sterile dressing are applied. The limb is placed into a knee immobilizer and the patient is awakened and transported to recovery in stable condition.      Please note that a surgical assistant was a medical necessity for this procedure in order to perform it in a safe and expeditious manner. Surgical assistant was necessary to retract the ligaments and vital neurovascular structures to prevent injury to them and also necessary for proper positioning of the limb to allow for anatomic placement of the prosthesis.   Dione Plover Deidra Spease, MD    08/12/2021, 12:53 PM

## 2021-08-12 NOTE — Anesthesia Postprocedure Evaluation (Signed)
Anesthesia Post Note  Patient: Christian [name]  Procedure(s) Performed: TOTAL KNEE ARTHROPLASTY (Left: Knee)     Patient location during evaluation: PACU Anesthesia Type: General Level of consciousness: awake and alert Pain management: pain level controlled Vital Signs Assessment: post-procedure vital signs reviewed and stable Respiratory status: spontaneous breathing, nonlabored ventilation, respiratory function stable and patient connected to nasal cannula oxygen Cardiovascular status: blood pressure returned to baseline and stable Postop Assessment: no apparent nausea or vomiting Anesthetic complications: no   No notable events documented.  Last Vitals:  Vitals:   08/12/21 1646 08/12/21 1649  BP: (!) 195/81 (!) 181/88  Pulse: 66   Resp: 16   Temp: 36.7 C   SpO2: 97%     Last Pain:  Vitals:   08/12/21 1646  TempSrc: Oral  PainSc:                  Shelton Silvas

## 2021-08-12 NOTE — Evaluation (Signed)
Physical Therapy Evaluation Patient Details Name: Christian [name] MRN: [id number] DOB: 1960-08-06 Today's Date: 08/12/2021  History of Present Illness  Patient is 61 y.o. male s/p L-TKA on 08/12/21. PMH: OA, HTN, HLD, back pain, ACDF C3-7, R-TKA 2022, lumbar stenosis with neurogenic claudication.  Clinical Impression  Christian [name] is a 61 y.o. male POD 0 s/p L-TKA. Patient reports independence with mobility at baseline. Patient is now limited by functional impairments (see PT problem list below) and requires min guard for transfers and gait with RW. Patient was able to ambulate 35 feet with RW and min guard assist. Patient instructed in exercise to facilitate ROM and circulation to manage edema. Patient will benefit from continued skilled PT interventions to address impairments and progress towards PLOF. Acute PT will follow to progress mobility and stair training in preparation for safe discharge home.       Recommendations for follow up therapy are one component of a multi-disciplinary discharge planning process, led by the attending physician.  Recommendations may be updated based on patient status, additional functional criteria and insurance authorization.  Follow Up Recommendations Follow physician's recommendations for discharge plan and follow up therapies    Assistance Recommended at Discharge Set up Supervision/Assistance  Patient can return home with the following  A little help with walking and/or transfers;A little help with bathing/dressing/bathroom;Assistance with cooking/housework;Assist for transportation;Help with stairs or ramp for entrance    Equipment Recommendations None recommended by PT (pt has recommended DME)  Recommendations for Other Services       Functional Status Assessment Patient has had a recent decline in their functional status and demonstrates the ability to make significant improvements in function in a reasonable and predictable amount of time.      Precautions / Restrictions Precautions Precautions: Fall Restrictions Weight Bearing Restrictions: No Other Position/Activity Restrictions: WBAT      Mobility  Bed Mobility Overal bed mobility: Needs Assistance Bed Mobility: Supine to Sit     Supine to sit: Min guard     General bed mobility comments: Min guard for safety only, no physical assist required.    Transfers Overall transfer level: Needs assistance Equipment used: Rolling walker (2 wheels) Transfers: Sit to/from Stand Sit to Stand: Min guard           General transfer comment: For safety only, no physical assist required. VCs for sequencing/    Ambulation/Gait Ambulation/Gait assistance: Supervision, Min guard Gait Distance (Feet): 35 Feet Assistive device: Rolling walker (2 wheels) Gait Pattern/deviations: Step-to pattern Gait velocity: decreased     General Gait Details: Pt ambulated with RW with min guard assist progressed to supervision by end of ambulation task, no overt LOB noted.  Stairs            Wheelchair Mobility    Modified Rankin (Stroke Patients Only)       Balance Overall balance assessment: Needs assistance Sitting-balance support: Feet supported, No upper extremity supported Sitting balance-Leahy Scale: Fair     Standing balance support: During functional activity, Bilateral upper extremity supported Standing balance-Leahy Scale: Fair                               Pertinent Vitals/Pain Pain Assessment Pain Assessment: No/denies pain Pain Intervention(s): Monitored during session    Home Living Family/patient expects to be discharged to:: Private residence Living Arrangements: Spouse/significant other Available Help at Discharge: Family;Available 24 hours/day Type of Home: House Home Access:  Stairs to enter Entrance Stairs-Rails: None Entrance Stairs-Number of Steps: 1 Alternate Level Stairs-Number of Steps: 14 Home Layout: Two level;Able to  live on main level with bedroom/bathroom;Bed/bath upstairs;1/2 bath on main level Home Equipment: Rolling Walker (2 wheels);Cane - single point;Shower seat      Prior Function Prior Level of Function : Independent/Modified Independent             Mobility Comments: ind ADLs Comments: ind     Hand Dominance   Dominant Hand: Right    Extremity/Trunk Assessment   Upper Extremity Assessment Upper Extremity Assessment: Overall WFL for tasks assessed    Lower Extremity Assessment Lower Extremity Assessment: RLE deficits/detail;LLE deficits/detail RLE Deficits / Details: MMT ank DF/PF 5/5 RLE Sensation: WNL LLE Deficits / Details: MMT ank DF/PF 5/5, no extensor lag noted LLE Sensation: WNL    Cervical / Trunk Assessment Cervical / Trunk Assessment: Neck Surgery  Communication   Communication: No difficulties  Cognition Arousal/Alertness: Awake/alert Behavior During Therapy: WFL for tasks assessed/performed Overall Cognitive Status: Within Functional Limits for tasks assessed                                          General Comments      Exercises Total Joint Exercises Ankle Circles/Pumps: AROM, 20 reps, Both Other Exercises Other Exercises: incentive spirometry x3, VCs for slow and controlled inspiration   Assessment/Plan    PT Assessment Patient needs continued PT services  PT Problem List Decreased strength;Decreased range of motion;Decreased activity tolerance;Decreased balance;Decreased mobility;Decreased coordination;Pain       PT Treatment Interventions DME instruction;Gait training;Stair training;Functional mobility training;Therapeutic activities;Therapeutic exercise;Balance training;Neuromuscular re-education;Patient/family education    PT Goals (Current goals can be found in the Care Plan section)  Acute Rehab PT Goals Patient Stated Goal: travel to [place] in 8 weeks PT Goal Formulation: With patient Time For Goal Achievement:  08/19/21 Potential to Achieve Goals: Good    Frequency 7X/week     Co-evaluation               AM-PAC PT "6 Clicks" Mobility  Outcome Measure Help needed turning from your back to your side while in a flat bed without using bedrails?: None Help needed moving from lying on your back to sitting on the side of a flat bed without using bedrails?: A Little Help needed moving to and from a bed to a chair (including a wheelchair)?: A Little Help needed standing up from a chair using your arms (e.g., wheelchair or bedside chair)?: A Little Help needed to walk in hospital room?: A Little Help needed climbing 3-5 steps with a railing? : A Little 6 Click Score: 19    End of Session Equipment Utilized During Treatment: Gait belt Activity Tolerance: Patient tolerated treatment well;No increased pain Patient left: in chair;with call bell/phone within reach;with chair alarm set Nurse Communication: Mobility status PT Visit Diagnosis: Difficulty in walking, not elsewhere classified (R26.2);Pain Pain - Right/Left: Left Pain - part of body: Knee    Time: 1820-1840 PT Time Calculation (min) (ACUTE ONLY): 20 min   Charges:   PT Evaluation $PT Eval Low Complexity: 1 Low         Jamesetta Geralds, PT, DPT [facility] Rehabilitation Department Office: [phone] Pager: [phone]  Jamesetta Geralds 08/12/2021, 7:15 PM

## 2021-08-12 NOTE — Progress Notes (Signed)
Orthopedic Tech Progress Note Patient Details:  TOSHIRO HANKEN 1960/07/01 [id number]  CPM Left Knee CPM Left Knee: On Left Knee Flexion (Degrees): 40 Left Knee Extension (Degrees): 10  Post Interventions Patient Tolerated: Well Instructions Provided: Care of device, Adjustment of device  [name] 08/12/2021, 1:23 PM

## 2021-08-12 NOTE — Interval H&P Note (Signed)
History and Physical Interval Note:  08/12/2021 9:45 AM  Christian [name]  has presented today for surgery, with the diagnosis of left knee osteoarthritis.  The various methods of treatment have been discussed with the patient and family. After consideration of risks, benefits and other options for treatment, the patient has consented to  Procedure(s): TOTAL KNEE ARTHROPLASTY (Left) as a surgical intervention.  The patient's history has been reviewed, patient examined, no change in status, stable for surgery.  I have reviewed the patient's chart and labs.  Questions were answered to the patient's satisfaction.     Pilar Plate Smt. [facility]

## 2021-08-12 NOTE — Plan of Care (Signed)
Problem: Education: Goal: Knowledge of the prescribed therapeutic regimen will improve Outcome: Progressing   Problem: Activity: Goal: Ability to avoid complications of mobility impairment will improve Outcome: Progressing   Problem: Clinical Measurements: Goal: Postoperative complications will be avoided or minimized Outcome: Progressing    Haydee Salter, RN 08/12/21 8:49 PM

## 2021-08-12 NOTE — Transfer of Care (Signed)
Immediate Anesthesia Transfer of Care Note  Patient: Christian [name]  Procedure(s) Performed: TOTAL KNEE ARTHROPLASTY (Left: Knee)  Patient Location: PACU  Anesthesia Type:General  Level of Consciousness: sedated, patient cooperative and responds to stimulation  Airway & Oxygen Therapy: Patient Spontanous Breathing and Patient connected to face mask oxygen  Post-op Assessment: Report given to RN and Post -op Vital signs reviewed and stable  Post vital signs: Reviewed and stable  Last Vitals:  Vitals Value Taken Time  BP 191/169 08/12/21 1325  Temp    Pulse 68 08/12/21 1333  Resp 14 08/12/21 1333  SpO2 94 % 08/12/21 1333  Vitals shown include unvalidated device data.  Last Pain:  Vitals:   08/12/21 0935  TempSrc: Oral  PainSc:       Patients Stated Pain Goal: 4 ([id number])  Complications: No notable events documented.

## 2021-08-13 ENCOUNTER — Encounter (HOSPITAL_COMMUNITY): Payer: Self-pay | Admitting: Orthopedic Surgery

## 2021-08-13 DIAGNOSIS — M1712 Unilateral primary osteoarthritis, left knee: Secondary | ICD-10-CM | POA: Diagnosis not present

## 2021-08-13 LAB — CBC
HCT: 37 % — ABNORMAL LOW (ref 39.0–52.0)
Hemoglobin: 12.9 g/dL — ABNORMAL LOW (ref 13.0–17.0)
MCH: 29.1 pg (ref 26.0–34.0)
MCHC: 34.9 g/dL (ref 30.0–36.0)
MCV: 83.3 fL (ref 80.0–100.0)
Platelets: 195 10*3/uL (ref 150–400)
RBC: 4.44 MIL/uL (ref 4.22–5.81)
RDW: 15.1 % (ref 11.5–15.5)
WBC: 7.6 10*3/uL (ref 4.0–10.5)
nRBC: 0 % (ref 0.0–0.2)

## 2021-08-13 LAB — BASIC METABOLIC PANEL
Anion gap: 6 (ref 5–15)
BUN: 17 mg/dL (ref 8–23)
CO2: 25 mmol/L (ref 22–32)
Calcium: 8.5 mg/dL — ABNORMAL LOW (ref 8.9–10.3)
Chloride: 104 mmol/L (ref 98–111)
Creatinine, Ser: 0.65 mg/dL (ref 0.61–1.24)
GFR, Estimated: >60 mL/min (ref >60)
Glucose, Bld: 145 mg/dL — ABNORMAL HIGH (ref 70–99)
Potassium: 3.9 mmol/L (ref 3.5–5.1)
Sodium: 135 mmol/L (ref 135–145)

## 2021-08-13 MED ORDER — RIVAROXABAN 10 MG PO TABS
10.0000 mg | ORAL_TABLET | Freq: Every day | ORAL | 0 refills | Status: AC
Start: 2021-08-13 — End: 2021-09-02

## 2021-08-13 MED ORDER — HYDROMORPHONE HCL 2 MG PO TABS: 2.0000 mg | ORAL_TABLET | Freq: Four times a day (QID) | ORAL | 0 refills | Status: DC | PRN

## 2021-08-13 NOTE — Progress Notes (Signed)
Patient discharged to home w/ family. Given all belongings, instructions. Verbalized understanding of instructions. Escorted to pov via w/c. 

## 2021-08-13 NOTE — Progress Notes (Signed)
Physical Therapy Treatment Patient Details Name: Christian [name] MRN: [id number] DOB: September 14, 1960 Today's Date: 08/13/2021   History of Present Illness Patient is 61 y.o. male s/p L-TKA on 08/12/21. PMH: OA, HTN, HLD, back pain, ACDF C3-7, R-TKA 2022, lumbar stenosis with neurogenic claudication.    PT Comments    POD # 1 am session This is pt's second TKR.  Pt AxO x 3 and eager to go home.  Pt a little impulsive.  Stated, he got OOB by himself this morning and walker to bathroom.  Assisted with amb a functional distance in hallway.  Then returned to room to perform some TE's following HEP handout.  Instructed on proper tech, freq as well as use of ICE.   Addressed all mobility questions, discussed appropriate activity, educated on use of ICE.  Pt ready for D/C to home.   Recommendations for follow up therapy are one component of a multi-disciplinary discharge planning process, led by the attending physician.  Recommendations may be updated based on patient status, additional functional criteria and insurance authorization.  Follow Up Recommendations  Follow physician's recommendations for discharge plan and follow up therapies     Assistance Recommended at Discharge Set up Supervision/Assistance  Patient can return home with the following A little help with walking and/or transfers;A little help with bathing/dressing/bathroom;Assistance with cooking/housework;Assist for transportation;Help with stairs or ramp for entrance   Equipment Recommendations  None recommended by PT    Recommendations for Other Services       Precautions / Restrictions Precautions Precautions: Fall Precaution Comments: instructed no pillow under knee Restrictions Weight Bearing Restrictions: No Other Position/Activity Restrictions: WBAT     Mobility  Bed Mobility               General bed mobility comments: OOB in recliner    Transfers Overall transfer level: Needs assistance Equipment used:  Rolling walker (2 wheels) Transfers: Sit to/from Stand Sit to Stand: Supervision, Min guard           General transfer comment: For safety only, no physical assist required. VCs for sequencing/pt impulsive    Ambulation/Gait Ambulation/Gait assistance: Supervision Gait Distance (Feet): 55 Feet Assistive device: Rolling walker (2 wheels) Gait Pattern/deviations: Step-to pattern, Step-through pattern, Decreased stance time - left Gait velocity: decreased     General Gait Details: tolerated a functional distance with walker at 25% VC's on safety with turns.   Stairs             Wheelchair Mobility    Modified Rankin (Stroke Patients Only)       Balance                                            Cognition Arousal/Alertness: Awake/alert Behavior During Therapy: WFL for tasks assessed/performed Overall Cognitive Status: Within Functional Limits for tasks assessed                                 General Comments: AxO x 3 eager to go home        Exercises  Total Knee Replacement TE's following HEP handout 10 reps B LE ankle pumps 05 reps towel squeezes 05 reps knee presses 05 reps heel slides  05 reps SAQ's 05 reps SLR's 05 reps ABD 05 reps knee bends 05 reps assisted knee bends  05 reps SAQ's Educated on use of gait belt to assist with TE's Followed by ICE     General Comments        Pertinent Vitals/Pain Pain Assessment Pain Assessment: 0-10 Pain Score: 5  Pain Location: L knee Pain Descriptors / Indicators: Aching, Operative site guarding, Tender, Tightness Pain Intervention(s): Monitored during session, Premedicated before session, Repositioned, Ice applied    Home Living                          Prior Function            PT Goals (current goals can now be found in the care plan section) Progress towards PT goals: Progressing toward goals    Frequency    7X/week      PT Plan Current  plan remains appropriate    Co-evaluation              AM-PAC PT "6 Clicks" Mobility   Outcome Measure  Help needed turning from your back to your side while in a flat bed without using bedrails?: A Little Help needed moving from lying on your back to sitting on the side of a flat bed without using bedrails?: A Little Help needed moving to and from a bed to a chair (including a wheelchair)?: A Little Help needed standing up from a chair using your arms (e.g., wheelchair or bedside chair)?: A Little Help needed to walk in hospital room?: A Little Help needed climbing 3-5 steps with a railing? : A Little 6 Click Score: 18    End of Session Equipment Utilized During Treatment: Gait belt Activity Tolerance: Patient tolerated treatment well Patient left: in chair;with call bell/phone within reach;with chair alarm set Nurse Communication: Mobility status (ready for D/C after one session) PT Visit Diagnosis: Difficulty in walking, not elsewhere classified (R26.2);Pain Pain - Right/Left: Left Pain - part of body: Knee     Time: 1020-1048 PT Time Calculation (min) (ACUTE ONLY): 28 min  Charges:  $Gait Training: 8-22 mins $Therapeutic Exercise: 8-22 mins                     {Asyia Hornung  PTA Acute  Rehabilitation Services Pager      [phone] Office      [phone]

## 2021-08-13 NOTE — TOC Transition Note (Signed)
Transition of Care [facility]) - CM/SW Discharge Note   Patient Details  Name: Christian [name] MRN: [id number] Date of Birth: 10-Jul-1960  Transition of [facility]) CM/SW Contact:  Lennart Pall, LCSW Phone Number: 08/13/2021, 9:50 AM   Clinical Narrative:    Met briefly with pt and confirming her has needed DME at home.  Plan for OPPT at [facility].  No TOC needs.   Final next level of care: OP Rehab Barriers to Discharge: No Barriers Identified   Patient Goals and CMS Choice Patient states their goals for this hospitalization and ongoing recovery are:: return home      Discharge Placement                       Discharge Plan and Services                DME Arranged: N/A DME Agency: NA                  Social Determinants of Health (SDOH) Interventions     Readmission Risk Interventions     View : No data to display.

## 2021-08-13 NOTE — Progress Notes (Signed)
   Subjective: 1 Day Post-Op Procedure(s) (LRB): TOTAL KNEE ARTHROPLASTY (Left) Patient reports pain as mild.   Patient seen in rounds by Dr. Wynelle Link. Patient is doing well, pain controlled with current regimen. No issues overnight. Denies chest pain, SOB, or calf pain. Voiding without difficulty. We will continue therapy today, ambulated 71' yesterday.   Objective: Vital signs in last 24 hours: Temp:  [97.6 F (36.4 C)-98.7 F (37.1 C)] 98 F (36.7 C) (05/23 0611) Pulse Rate:  [51-82] 59 (05/23 0611) Resp:  [10-21] 18 (05/23 0611) BP: (151-211)/(67-169) 163/67 (05/23 0611) SpO2:  [95 %-100 %] 96 % (05/23 0611) Weight:  [82.1 kg] 82.1 kg (05/22 0929)  Intake/Output from previous day:  Intake/Output Summary (Last 24 hours) at 08/13/2021 0736 Last data filed at 08/13/2021 0616 Gross per 24 hour  Intake 3230.08 ml  Output 525 ml  Net 2705.08 ml     Intake/Output this shift: No intake/output data recorded.  Labs: Recent Labs    08/13/21 0307  HGB 12.9*   Recent Labs    08/13/21 0307  WBC 7.6  RBC 4.44  HCT 37.0*  PLT 195   Recent Labs    08/13/21 0307  NA 135  K 3.9  CL 104  CO2 25  BUN 17  CREATININE 0.65  GLUCOSE 145*  CALCIUM 8.5*   No results for input(s): LABPT, INR in the last 72 hours.  Exam: General - Patient is Alert and Oriented Extremity - Neurologically intact Neurovascular intact Sensation intact distally Dorsiflexion/Plantar flexion intact Dressing - dressing C/D/I Motor Function - intact, moving foot and toes well on exam.   Past Medical History:  Diagnosis Date   Chronic back pain    Chronic knee pain    DDD (degenerative disc disease), lumbosacral    History of kidney stones    Hyperlipidemia    Hypertension    Kidney stone    OA (osteoarthritis)    knees   Spinal stenosis, lumbar region with neurogenic claudication    Weakness of extremity    per pt upper and lower extermities due to back issues    Assessment/Plan: 1  Day Post-Op Procedure(s) (LRB): TOTAL KNEE ARTHROPLASTY (Left) Principal Problem:   Primary osteoarthritis of left knee  Estimated body mass index is 29.21 kg/m as calculated from the following:   Height as of this encounter: 5\' 6"  (1.676 m).   Weight as of this encounter: 82.1 kg. Advance diet Up with therapy D/C IV fluids   Patient's anticipated LOS is less than 2 midnights, meeting these requirements: - Younger than 60 - Lives within 1 hour of care - Has a competent adult at home to recover with post-op recover - NO history of  - Diabetes  - Coronary Artery Disease  - Heart failure  - Heart attack  - DVT/VTE  - Cardiac arrhythmia  - Respiratory Failure/COPD  - Renal failure  - Anemia  - Advanced Liver disease  DVT Prophylaxis - Xarelto Weight bearing as tolerated. Continue therapy.  Plan is to go Home after hospital stay. Plan for discharge later today if progresses with therapy and meeting goals. Scheduled for OPPT at [facility] Follow-up in the office in 2 weeks.  The PDMP database was reviewed today prior to any opioid medications being prescribed to this patient.  Theresa Duty, PA-C Orthopedic Surgery [phone] 08/13/2021, 7:36 AM

## 2021-08-13 NOTE — Discharge Summary (Signed)
Patient ID: JACHAI OKAZAKI MRN: [id number] DOB/AGE: 1960-12-10 61 y.o.  Admit date: 08/12/2021 Discharge date: 08/13/2021  Admission Diagnoses:  Principal Problem:   Primary osteoarthritis of left knee   Discharge Diagnoses:  Same  Past Medical History:  Diagnosis Date   Chronic back pain    Chronic knee pain    DDD (degenerative disc disease), lumbosacral    History of kidney stones    Hyperlipidemia    Hypertension    Kidney stone    OA (osteoarthritis)    knees   Spinal stenosis, lumbar region with neurogenic claudication    Weakness of extremity    per pt upper and lower extermities due to back issues    Surgeries: Procedure(s): TOTAL KNEE ARTHROPLASTY on 08/12/2021   Consultants:   Discharged Condition: Improved  Hospital Course: JAVARIAN JAKUBIAK is an 61 y.o. male who was admitted 08/12/2021 for operative treatment ofPrimary osteoarthritis of left knee. Patient has severe unremitting pain that affects sleep, daily activities, and work/hobbies. After pre-op clearance the patient was taken to the operating room on 08/12/2021 and underwent  Procedure(s): TOTAL KNEE ARTHROPLASTY.    Patient was given perioperative antibiotics:  Anti-infectives (From admission, onward)    Start     Dose/Rate Route Frequency Ordered Stop   08/12/21 1800  ceFAZolin (ANCEF) IVPB 2g/100 mL premix        2 g 200 mL/hr over 30 Minutes Intravenous Every 6 hours 08/12/21 1614 08/13/21 0022   08/12/21 0915  ceFAZolin (ANCEF) IVPB 2g/100 mL premix        2 g 200 mL/hr over 30 Minutes Intravenous On call to O.R. 08/12/21 0902 08/12/21 1154        Patient was given sequential compression devices, early ambulation, and chemoprophylaxis to prevent DVT.  Patient benefited maximally from hospital stay and there were no complications.    Recent vital signs: Patient Vitals for the past 24 hrs:  BP Temp Temp src Pulse Resp SpO2  08/13/21 0939 (!) 165/88 98.2 F (36.8 C) -- 69 18 100 %  08/13/21  0611 (!) 163/67 98 F (36.7 C) Oral (!) 59 18 96 %  08/13/21 0129 (!) 156/82 98.3 F (36.8 C) Oral 72 18 95 %  08/12/21 2309 (!) 154/77 98.7 F (37.1 C) Oral 73 15 95 %  08/12/21 2057 (!) 183/82 98 F (36.7 C) Oral 73 18 99 %  08/12/21 1649 (!) 181/88 -- -- -- -- --  08/12/21 1646 (!) 195/81 98.1 F (36.7 C) Oral 66 16 97 %  08/12/21 1530 (!) 164/84 -- -- 61 13 97 %  08/12/21 1515 (!) 151/79 -- -- 60 13 97 %  08/12/21 1500 (!) 154/84 -- -- (!) 56 12 97 %  08/12/21 1430 (!) 209/96 -- -- 65 12 100 %  08/12/21 1415 (!) 211/94 -- -- (!) 55 10 100 %  08/12/21 1400 (!) 204/102 -- -- 61 14 97 %  08/12/21 1325 (!) 191/169 97.6 F (36.4 C) -- 82 18 97 %     Recent laboratory studies:  Recent Labs    08/13/21 0307  WBC 7.6  HGB 12.9*  HCT 37.0*  PLT 195  NA 135  K 3.9  CL 104  CO2 25  BUN 17  CREATININE 0.65  GLUCOSE 145*  CALCIUM 8.5*     Discharge Medications:   Allergies as of 08/13/2021       Reactions   Pork-derived Products    PT DOES NOT EAT PORK  Medication List     STOP taking these medications    HYDROcodone-acetaminophen 5-325 MG tablet Commonly known as: NORCO/VICODIN   meloxicam 15 MG tablet Commonly known as: MOBIC       TAKE these medications    baclofen 20 MG tablet Commonly known as: LIORESAL Take 20 mg by mouth in the morning.   carvedilol 25 MG tablet Commonly known as: COREG Take 25 mg by mouth 2 (two) times daily with a meal.   DULoxetine 30 MG capsule Commonly known as: CYMBALTA Take 30 mg by mouth 3 (three) times daily.   gabapentin 800 MG tablet Commonly known as: NEURONTIN Take 800 mg by mouth in the morning, at noon, and at bedtime.   HYDROmorphone 2 MG tablet Commonly known as: DILAUDID Take 1-2 tablets (2-4 mg total) by mouth every 6 (six) hours as needed for severe pain. Take lowest dose possible and taper off medication as tolerated.   losartan 100 MG tablet Commonly known as: COZAAR Take 100 mg by mouth  in the morning.   lovastatin 40 MG tablet Commonly known as: MEVACOR Take 40 mg by mouth at bedtime.   pantoprazole 40 MG tablet Commonly known as: PROTONIX Take 40 mg by mouth every evening.   rivaroxaban 10 MG Tabs tablet Commonly known as: XARELTO Take 1 tablet (10 mg total) by mouth daily with breakfast for 20 days. Then take one 81 mg aspirin once a day for three weeks. Then discontinue aspirin.   tiZANidine 4 MG tablet Commonly known as: ZANAFLEX Take 4 mg by mouth in the morning, at noon, and at bedtime.   Victoza 18 MG/3ML Sopn Generic drug: liraglutide Inject 1.8 mg into the skin in the morning.               Discharge Care Instructions  (From admission, onward)           Start     Ordered   08/13/21 0000  Weight bearing as tolerated        08/13/21 0740   08/13/21 0000  Change dressing       Comments: You may remove the bulky bandage (ACE wrap and gauze) two days after surgery. You will have an adhesive waterproof bandage underneath. Leave this in place until your first follow-up appointment.   08/13/21 0740            Diagnostic Studies: No results found.  Disposition: Discharge disposition: 01-Home or Self Care       Discharge Instructions     Call MD / Call 911   Complete by: As directed    If you experience chest pain or shortness of breath, CALL 911 and be transported to the hospital emergency room.  If you develope a fever above 101 F, pus (white drainage) or increased drainage or redness at the wound, or calf pain, call your surgeon's office.   Change dressing   Complete by: As directed    You may remove the bulky bandage (ACE wrap and gauze) two days after surgery. You will have an adhesive waterproof bandage underneath. Leave this in place until your first follow-up appointment.   Constipation Prevention   Complete by: As directed    Drink plenty of fluids.  Prune juice may be helpful.  You may use a stool softener, such as  Colace (over the counter) 100 mg twice a day.  Use MiraLax (over the counter) for constipation as needed.   Diet - low sodium heart healthy  Complete by: As directed    Do not put a pillow under the knee. Place it under the heel.   Complete by: As directed    Driving restrictions   Complete by: As directed    No driving for two weeks   Post-operative opioid taper instructions:   Complete by: As directed    POST-OPERATIVE OPIOID TAPER INSTRUCTIONS: It is important to wean off of your opioid medication as soon as possible. If you do not need pain medication after your surgery it is ok to stop day one. Opioids include: Codeine, Hydrocodone(Norco, Vicodin), Oxycodone(Percocet, oxycontin) and hydromorphone amongst others.  Long term and even short term use of opiods can cause: Increased pain response Dependence Constipation Depression Respiratory depression And more.  Withdrawal symptoms can include Flu like symptoms Nausea, vomiting And more Techniques to manage these symptoms Hydrate well Eat regular healthy meals Stay active Use relaxation techniques(deep breathing, meditating, yoga) Do Not substitute Alcohol to help with tapering If you have been on opioids for less than two weeks and do not have pain than it is ok to stop all together.  Plan to wean off of opioids This plan should start within one week post op of your joint replacement. Maintain the same interval or time between taking each dose and first decrease the dose.  Cut the total daily intake of opioids by one tablet each day Next start to increase the time between doses. The last dose that should be eliminated is the evening dose.      TED hose   Complete by: As directed    Use stockings (TED hose) for three weeks on both leg(s).  You may remove them at night for sleeping.   Weight bearing as tolerated   Complete by: As directed         Follow-up Information     Aluisio, Homero FellersFrank, MD. Schedule an appointment  as soon as possible for a visit in 2 week(s).   Specialty: Orthopedic Surgery Contact information: [address] [place] [id number] [phone]                  Signed: Arther AbbottKristie Shaia Porath 08/13/2021, 1:01 PM

## 2022-10-30 ENCOUNTER — Emergency Department (HOSPITAL_COMMUNITY)
Admission: EM | Admit: 2022-10-30 | Discharge: 2022-10-31 | Disposition: A | Discharge status: Home / Self Care | Payer: Medicare Other | Source: Home / Self Care | Attending: Emergency Medicine | Admitting: Emergency Medicine

## 2022-10-30 ENCOUNTER — Emergency Department (HOSPITAL_COMMUNITY): Payer: Medicare Other

## 2022-10-30 ENCOUNTER — Other Ambulatory Visit: Payer: Self-pay

## 2022-10-30 ENCOUNTER — Encounter (HOSPITAL_COMMUNITY): Payer: Self-pay

## 2022-10-30 DIAGNOSIS — R0602 Shortness of breath: Secondary | ICD-10-CM | POA: Diagnosis present

## 2022-10-30 DIAGNOSIS — Z7901 Long term (current) use of anticoagulants: Secondary | ICD-10-CM | POA: Diagnosis not present

## 2022-10-30 DIAGNOSIS — I10 Essential (primary) hypertension: Secondary | ICD-10-CM | POA: Diagnosis not present

## 2022-10-30 DIAGNOSIS — Z79899 Other long term (current) drug therapy: Secondary | ICD-10-CM | POA: Diagnosis not present

## 2022-10-30 DIAGNOSIS — I4891 Unspecified atrial fibrillation: Secondary | ICD-10-CM

## 2022-10-30 LAB — CBC WITH DIFFERENTIAL/PLATELET
Abs Immature Granulocytes: 0.01 10*3/uL (ref 0.00–0.07)
Basophils Absolute: 0 10*3/uL (ref 0.0–0.1)
Basophils Relative: 1 %
Eosinophils Absolute: 0.2 10*3/uL (ref 0.0–0.5)
Eosinophils Relative: 4 %
HCT: 45.9 % (ref 39.0–52.0)
Hemoglobin: 15.4 g/dL (ref 13.0–17.0)
Immature Granulocytes: 0 %
Lymphocytes Relative: 49 %
Lymphs Abs: 2.5 10*3/uL (ref 0.7–4.0)
MCH: 29.2 pg (ref 26.0–34.0)
MCHC: 33.6 g/dL (ref 30.0–36.0)
MCV: 87.1 fL (ref 80.0–100.0)
Monocytes Absolute: 0.6 10*3/uL (ref 0.1–1.0)
Monocytes Relative: 12 %
Neutro Abs: 1.7 10*3/uL (ref 1.7–7.7)
Neutrophils Relative %: 34 %
Platelets: 221 10*3/uL (ref 150–400)
RBC: 5.27 MIL/uL (ref 4.22–5.81)
RDW: 14.3 % (ref 11.5–15.5)
WBC: 5.1 10*3/uL (ref 4.0–10.5)
nRBC: 0 % (ref 0.0–0.2)

## 2022-10-30 LAB — BASIC METABOLIC PANEL
Anion gap: 8 (ref 5–15)
BUN: 26 mg/dL — ABNORMAL HIGH (ref 8–23)
CO2: 23 mmol/L (ref 22–32)
Calcium: 8.8 mg/dL — ABNORMAL LOW (ref 8.9–10.3)
Chloride: 108 mmol/L (ref 98–111)
Creatinine, Ser: 0.58 mg/dL — ABNORMAL LOW (ref 0.61–1.24)
GFR, Estimated: >60 mL/min (ref >60)
Glucose, Bld: 143 mg/dL — ABNORMAL HIGH (ref 70–99)
Potassium: 3.8 mmol/L (ref 3.5–5.1)
Sodium: 139 mmol/L (ref 135–145)

## 2022-10-30 LAB — TROPONIN I (HIGH SENSITIVITY): Troponin I (High Sensitivity): 11 ng/L (ref <18)

## 2022-10-30 MED ORDER — METOPROLOL TARTRATE 5 MG/5ML IV SOLN
2.5000 mg | Freq: Once | INTRAVENOUS | Status: AC
  Administered 2022-10-30: 2.5 mg via INTRAVENOUS
  Filled 2022-10-30: qty 5

## 2022-10-30 NOTE — ED Triage Notes (Addendum)
Pt sts generalized chest pains and shortness of breath since yesterday.   Denies hx of A-fib. Not taking any anticoagulants .

## 2022-10-30 NOTE — ED Provider Notes (Signed)
[facility] EMERGENCY DEPARTMENT AT [facility] Provider Note   CSN: [id number] Arrival date & time: 10/30/22  2130     History  Chief Complaint  Patient presents with   Chest Pain    Christian [name] is a 62 y.o. male.  The history is provided by the patient and medical records.  Chest Pain Christian [name] is a 62 y.o. male who presents to the Emergency Department complaining of shortness of breath.  He presents to the emergency department for evaluation of shortness of breath and elevated heart rate that started this morning.  He reports no symptoms at all yesterday.  No fever, cough.  He does feel fatigued.  No nausea, vomiting, diarrhea, leg swelling or pain.  He has a history of hypertension and takes carvedilol 25 twice daily.  He does smoke tobacco.  No alcohol use.  No prior history of palpitations.      Home Medications Prior to Admission medications   Medication Sig Start Date End Date Taking? Authorizing Provider  apixaban (ELIQUIS) 5 MG TABS tablet Take 1 tablet (5 mg total) by mouth 2 (two) times daily. 10/31/22 11/30/22 Yes Tilden Fossa, MD  baclofen (LIORESAL) 20 MG tablet Take 20 mg by mouth in the morning.    [provider]  carvedilol (COREG) 25 MG tablet Take 25 mg by mouth 2 (two) times daily with a meal.    [provider]  DULoxetine (CYMBALTA) 30 MG capsule Take 30 mg by mouth 3 (three) times daily.    [provider]  gabapentin (NEURONTIN) 800 MG tablet Take 800 mg by mouth in the morning, at noon, and at bedtime.    [provider]  HYDROmorphone (DILAUDID) 2 MG tablet Take 1-2 tablets (2-4 mg total) by mouth every 6 (six) hours as needed for severe pain. Take lowest dose possible and taper off medication as tolerated. 08/13/21   Edmisten, Kristie L, PA  losartan (COZAAR) 100 MG tablet Take 100 mg by mouth in the morning.    [provider]  lovastatin (MEVACOR) 40 MG tablet Take 40 mg by mouth at bedtime.     [provider]  pantoprazole (PROTONIX) 40 MG tablet Take 40 mg by mouth every evening.    [provider]  tiZANidine (ZANAFLEX) 4 MG tablet Take 4 mg by mouth in the morning, at noon, and at bedtime. 07/11/21   [provider]  VICTOZA 18 MG/3ML SOPN Inject 1.8 mg into the skin in the morning. 01/08/21   [provider]      Allergies    Pork-derived products    Review of Systems   Review of Systems  Cardiovascular:  Positive for chest pain.  All other systems reviewed and are negative.   Physical Exam Updated Vital Signs BP (!) 140/96   Pulse 82   Temp 97.6 F (36.4 C)   Resp 20   Ht 5\' 6"  (1.676 m)   Wt 81.6 kg   SpO2 99%   BMI 29.05 kg/m  Physical Exam Vitals and nursing note reviewed.  Constitutional:      Appearance: He is well-developed.  HENT:     Head: Normocephalic and atraumatic.  Cardiovascular:     Rate and Rhythm: Tachycardia present. Rhythm irregular.     Heart sounds: No murmur heard. Pulmonary:     Effort: Pulmonary effort is normal. No respiratory distress.     Breath sounds: Normal breath sounds.  Abdominal:     Palpations:  Abdomen is soft.     Tenderness: There is no abdominal tenderness. There is no guarding or rebound.  Musculoskeletal:        General: No tenderness.     Comments: Trace pitting edema to BLE  Skin:    General: Skin is warm and dry.  Neurological:     Mental Status: He is alert and oriented to person, place, and time.  Psychiatric:        Behavior: Behavior normal.     ED Results / Procedures / Treatments   Labs (all labs ordered are listed, but only abnormal results are displayed) Labs Reviewed  BASIC METABOLIC PANEL - Abnormal; Notable for the following components:      Result Value   Glucose, Bld 143 (*)    BUN 26 (*)    Creatinine, Ser 0.58 (*)    Calcium 8.8 (*)    All other components within normal limits  BRAIN NATRIURETIC PEPTIDE - Abnormal; Notable for the following  components:   B Natriuretic Peptide 532.5 (*)    All other components within normal limits  CBC WITH DIFFERENTIAL/PLATELET  TSH  TROPONIN I (HIGH SENSITIVITY)  TROPONIN I (HIGH SENSITIVITY)    EKG EKG Interpretation Date/Time:  Thursday October 30 2022 21:38:41 EDT Ventricular Rate:  111 PR Interval:    QRS Duration:  149 QT Interval:  367 QTC Calculation: 499 R Axis:   110  Text Interpretation: Atrial fibrillation RBBB and LPFB Confirmed by Tilden Fossa [phone]) on 10/30/2022 10:52:26 PM  Radiology DG Chest 2 View  Result Date: 10/30/2022 CLINICAL DATA:  cp, shob EXAM: CHEST - 2 VIEW COMPARISON:  11/29/2012 FINDINGS: The heart size and mediastinal contours are within normal limits. Both lungs are clear. The visualized skeletal structures are unremarkable. IMPRESSION: No active cardiopulmonary disease. Electronically Signed   By: Charlett Nose M.D.   On: 10/30/2022 22:05    Procedures Procedures   CRITICAL CARE Performed by: Tilden Fossa   Total critical care time: 35 minutes  Critical care time was exclusive of separately billable procedures and treating other patients.  Critical care was necessary to treat or prevent imminent or life-threatening deterioration.  Critical care was time spent personally by me on the following activities: development of treatment plan with patient and/or surrogate as well as nursing, discussions with consultants, evaluation of patient's response to treatment, examination of patient, obtaining history from patient or surrogate, ordering and performing treatments and interventions, ordering and review of laboratory studies, ordering and review of radiographic studies, pulse oximetry and re-evaluation of patient's condition.  Medications Ordered in ED Medications  metoprolol tartrate (LOPRESSOR) injection 2.5 mg (2.5 mg Intravenous Given 10/30/22 2336)  metoprolol tartrate (LOPRESSOR) injection 2.5 mg (2.5 mg Intravenous Given 10/31/22 0209)     ED Course/ Medical Decision Making/ A&P                                 Medical Decision Making Amount and/or Complexity of Data Reviewed Labs: ordered. Radiology: ordered.  Risk Prescription drug management.  Pt with hx/o HTN, HPL here for evaluation of shortness of breath and palpitations that started today.  He is in new onset A-fib with rates 100s to 140s.  He does take carvedilol at home, did not take his nighttime dose.  He was treated with metoprolol in the emergency department with improvement in his heart rate to 100-1 10.  He does have some mild lower  extremity edema, BNP was obtained which is elevated to 500s.  No priors available for comparison.  On record review in epic he has had a prior echo several years ago with EF of 70%.  He he denies any history of TIA/CVA but on record review from 39 he does have documentation of CVA in the medical record.  Discussed with on-call cardiologist, Dr. Paulino Rily -given current rate control and hemodynamic stability as well as unclear time of onset of symptoms cardioversion in the emergency department was not recommended.  Will continue his carvedilol at home, will start Eliquis for CHA2DS2-VASc of 2.  Will refer to the A-fib clinic.  Discussed return precautions for A-fib as well as anticoagulation.        Final Clinical Impression(s) / ED Diagnoses Final diagnoses:  Atrial fibrillation, unspecified type [facility])    Rx / DC Orders ED Discharge Orders          Ordered    Amb Referral to AFIB Clinic        10/31/22 0201    apixaban (ELIQUIS) 5 MG TABS tablet  2 times daily        10/31/22 0202              Tilden Fossa, MD 10/31/22 [phone]

## 2022-10-31 DIAGNOSIS — I4891 Unspecified atrial fibrillation: Secondary | ICD-10-CM | POA: Diagnosis not present

## 2022-10-31 MED ORDER — METOPROLOL TARTRATE 5 MG/5ML IV SOLN
2.5000 mg | Freq: Once | INTRAVENOUS | Status: AC
  Administered 2022-10-31: 2.5 mg via INTRAVENOUS
  Filled 2022-10-31: qty 5

## 2022-10-31 MED ORDER — APIXABAN 5 MG PO TABS: 5.0000 mg | ORAL_TABLET | Freq: Two times a day (BID) | ORAL | 0 refills | Status: AC

## 2022-10-31 NOTE — Treatment Plan (Signed)
Received page from ED MD Madilyn Hook regarding patient's new onset atrial fibrillation.  62 yo M w/PMHx of CVA, HTN- Patient's HR remains 100s-110 and he has palpitations. He is otherwise hemodynamically stable and is on 25mg  BID of carvedilol.  ED inquiring about cardioversion.  Patient is not on anticoagulation and would need to be on this before attempting cardioversion as DCCV acutely increases risk of stroke following procedure. CHADSVASc 3+ given HTN, CVA. Additionally, there is no clear urgency to cardioversion given patient's hemodynamic stability. It is reasonable to have the patient start on anticoagulation, be volume optimized and consider DCCV in the outpatient setting if he remains in atrial fibrillation. At this time, would prioritize diuresis and addressing 'triggers' of arrhythmia, increasing beta blockade and obtaining formal echocardiogram for structural assessment.

## 2022-10-31 NOTE — Discharge Instructions (Addendum)
You will receive a call from the [facility] atrial fibrillation clinic for follow-up.

## 2022-11-03 ENCOUNTER — Other Ambulatory Visit: Payer: Self-pay

## 2022-11-03 ENCOUNTER — Encounter (HOSPITAL_COMMUNITY): Payer: Self-pay

## 2022-11-03 ENCOUNTER — Emergency Department (HOSPITAL_COMMUNITY): Payer: Medicare Other

## 2022-11-03 ENCOUNTER — Inpatient Hospital Stay (HOSPITAL_COMMUNITY)
Admission: EM | Admit: 2022-11-03 | Discharge: 2022-11-07 | DRG: 310 | Disposition: A | Discharge status: Home / Self Care | Payer: Medicare Other | Attending: Internal Medicine | Admitting: Internal Medicine

## 2022-11-03 DIAGNOSIS — F17210 Nicotine dependence, cigarettes, uncomplicated: Secondary | ICD-10-CM | POA: Diagnosis present

## 2022-11-03 DIAGNOSIS — R7303 Prediabetes: Secondary | ICD-10-CM | POA: Diagnosis present

## 2022-11-03 DIAGNOSIS — I10 Essential (primary) hypertension: Secondary | ICD-10-CM | POA: Diagnosis present

## 2022-11-03 DIAGNOSIS — I4892 Unspecified atrial flutter: Secondary | ICD-10-CM | POA: Diagnosis present

## 2022-11-03 DIAGNOSIS — M549 Dorsalgia, unspecified: Secondary | ICD-10-CM | POA: Diagnosis present

## 2022-11-03 DIAGNOSIS — I4891 Unspecified atrial fibrillation: Principal | ICD-10-CM | POA: Diagnosis present

## 2022-11-03 DIAGNOSIS — I4510 Unspecified right bundle-branch block: Secondary | ICD-10-CM | POA: Diagnosis present

## 2022-11-03 DIAGNOSIS — Z79899 Other long term (current) drug therapy: Secondary | ICD-10-CM

## 2022-11-03 DIAGNOSIS — K219 Gastro-esophageal reflux disease without esophagitis: Secondary | ICD-10-CM | POA: Diagnosis present

## 2022-11-03 DIAGNOSIS — Z96653 Presence of artificial knee joint, bilateral: Secondary | ICD-10-CM | POA: Diagnosis present

## 2022-11-03 DIAGNOSIS — E876 Hypokalemia: Secondary | ICD-10-CM | POA: Diagnosis present

## 2022-11-03 DIAGNOSIS — Z7985 Long-term (current) use of injectable non-insulin antidiabetic drugs: Secondary | ICD-10-CM

## 2022-11-03 DIAGNOSIS — E785 Hyperlipidemia, unspecified: Secondary | ICD-10-CM | POA: Diagnosis present

## 2022-11-03 DIAGNOSIS — Z981 Arthrodesis status: Secondary | ICD-10-CM

## 2022-11-03 DIAGNOSIS — Z7901 Long term (current) use of anticoagulants: Secondary | ICD-10-CM

## 2022-11-03 DIAGNOSIS — I517 Cardiomegaly: Secondary | ICD-10-CM

## 2022-11-03 DIAGNOSIS — Z91014 Allergy to mammalian meats: Secondary | ICD-10-CM

## 2022-11-03 DIAGNOSIS — G8929 Other chronic pain: Secondary | ICD-10-CM | POA: Diagnosis present

## 2022-11-03 LAB — COMPREHENSIVE METABOLIC PANEL
ALT: 14 U/L (ref 0–44)
AST: 16 U/L (ref 15–41)
Albumin: 3.6 g/dL (ref 3.5–5.0)
Alkaline Phosphatase: 65 U/L (ref 38–126)
Anion gap: 7 (ref 5–15)
BUN: 17 mg/dL (ref 8–23)
CO2: 23 mmol/L (ref 22–32)
Calcium: 8.5 mg/dL — ABNORMAL LOW (ref 8.9–10.3)
Chloride: 106 mmol/L (ref 98–111)
Creatinine, Ser: 0.74 mg/dL (ref 0.61–1.24)
GFR, Estimated: >60 mL/min (ref >60)
Glucose, Bld: 161 mg/dL — ABNORMAL HIGH (ref 70–99)
Potassium: 3.4 mmol/L — ABNORMAL LOW (ref 3.5–5.1)
Sodium: 136 mmol/L (ref 135–145)
Total Bilirubin: 0.6 mg/dL (ref 0.3–1.2)
Total Protein: 6.9 g/dL (ref 6.5–8.1)

## 2022-11-03 LAB — MAGNESIUM: Magnesium: 2.1 mg/dL (ref 1.7–2.4)

## 2022-11-03 LAB — CBC
HCT: 48.5 % (ref 39.0–52.0)
Hemoglobin: 16.1 g/dL (ref 13.0–17.0)
MCH: 29.5 pg (ref 26.0–34.0)
MCHC: 33.2 g/dL (ref 30.0–36.0)
MCV: 89 fL (ref 80.0–100.0)
Platelets: 208 K/uL (ref 150–400)
RBC: 5.45 MIL/uL (ref 4.22–5.81)
RDW: 14.3 % (ref 11.5–15.5)
WBC: 4.8 K/uL (ref 4.0–10.5)
nRBC: 0 % (ref 0.0–0.2)

## 2022-11-03 LAB — TROPONIN I (HIGH SENSITIVITY)
Troponin I (High Sensitivity): 3 ng/L
Troponin I (High Sensitivity): 3 ng/L (ref <18)

## 2022-11-03 LAB — BRAIN NATRIURETIC PEPTIDE: B Natriuretic Peptide: 331.2 pg/mL — ABNORMAL HIGH (ref 0.0–100.0)

## 2022-11-03 MED ORDER — DILTIAZEM HCL 25 MG/5ML IV SOLN
20.0000 mg | Freq: Once | INTRAVENOUS | Status: AC
  Administered 2022-11-03: 20 mg via INTRAVENOUS
  Filled 2022-11-03: qty 5

## 2022-11-03 NOTE — ED Notes (Signed)
Patient ambulated independently throughout the department. Declines any SHOB or chest pain. Heart rate elevated to 115bpm

## 2022-11-03 NOTE — ED Triage Notes (Signed)
Pt sts generalized chest pains and shortness of breath since Friday. States was told he had A-fib and d/c  on blood thinner and told someone would contact him for cardiology appt. No call. Symptoms still the same, not getting better.

## 2022-11-03 NOTE — ED Provider Notes (Signed)
[place] [facility] AT [facility] Provider Note   CSN: [id number] Arrival date & time: 11/03/22  1749     History  Chief Complaint  Patient presents with   Chest Pain   Shortness of Breath    Since Friday    Christian [name] is a 62 y.o. male.  HPI   62 year old male with past medical history of recent newly diagnosed atrial fibrillation, placed on Eliquis 3 days ago presents emergency department with increasing palpitations, fatigue, shortness of breath and intermittent chest tightness.  Patient states he was discharged with a prescription for Eliquis.  He was on carvedilol before and his dose was not adjusted.  Since being discharged his symptoms have worsened.  He was supposed to follow-up with the cardiology atrial fibrillation clinic but was unsuccessful in scheduling appointment.  He comes back with worsening symptoms.  Home Medications Prior to Admission medications   Medication Sig Start Date End Date Taking? Authorizing Provider  apixaban (ELIQUIS) 5 MG TABS tablet Take 1 tablet (5 mg total) by mouth 2 (two) times daily. 10/31/22 11/30/22  Tilden Fossa, MD  baclofen (LIORESAL) 20 MG tablet Take 20 mg by mouth in the morning.    [provider]  carvedilol (COREG) 25 MG tablet Take 25 mg by mouth 2 (two) times daily with a meal.    [provider]  DULoxetine (CYMBALTA) 30 MG capsule Take 30 mg by mouth 3 (three) times daily.    [provider]  gabapentin (NEURONTIN) 800 MG tablet Take 800 mg by mouth in the morning, at noon, and at bedtime.    [provider]  HYDROmorphone (DILAUDID) 2 MG tablet Take 1-2 tablets (2-4 mg total) by mouth every 6 (six) hours as needed for severe pain. Take lowest dose possible and taper off medication as tolerated. 08/13/21   Edmisten,  L, PA  losartan (COZAAR) 100 MG tablet Take 100 mg by mouth in the morning.    [provider]  lovastatin (MEVACOR) 40 MG tablet Take  40 mg by mouth at bedtime.    [provider]  pantoprazole (PROTONIX) 40 MG tablet Take 40 mg by mouth every evening.    [provider]  tiZANidine (ZANAFLEX) 4 MG tablet Take 4 mg by mouth in the morning, at noon, and at bedtime. 07/11/21   [provider]  VICTOZA 18 MG/3ML SOPN Inject 1.8 mg into the skin in the morning. 01/08/21   [provider]      Allergies    Pork-derived products    Review of Systems   Review of Systems  Constitutional:  Positive for fatigue. Negative for fever.  Respiratory:  Positive for chest tightness and shortness of breath.   Cardiovascular:  Positive for chest pain and palpitations.  Gastrointestinal:  Negative for abdominal pain, diarrhea and vomiting.  Skin:  Negative for rash.  Neurological:  Positive for light-headedness. Negative for headaches.    Physical Exam Updated Vital Signs BP (!) 140/84   Pulse (!) 101   Temp 97.9 F (36.6 C)   Resp 19   Ht 5\' 6"  (1.676 m)   Wt 81 kg   SpO2 99%   BMI 28.82 kg/m  Physical Exam Vitals and nursing note reviewed.  Constitutional:      Appearance: Normal appearance.  HENT:     Head: Normocephalic.     Mouth/Throat:     Mouth: Mucous membranes are moist.  Cardiovascular:     Rate and  Rhythm: Tachycardia present. Rhythm irregular.  Pulmonary:     Effort: Pulmonary effort is normal. No respiratory distress.     Breath sounds: Examination of the right-lower field reveals decreased breath sounds. Examination of the left-lower field reveals decreased breath sounds. Decreased breath sounds present.  Abdominal:     Palpations: Abdomen is soft.     Tenderness: There is no abdominal tenderness.  Skin:    General: Skin is warm.  Neurological:     Mental Status: He is alert and oriented to person, place, and time. Mental status is at baseline.  Psychiatric:        Mood and Affect: Mood normal.     ED Results / Procedures / Treatments   Labs (all labs ordered  are listed, but only abnormal results are displayed) Labs Reviewed  COMPREHENSIVE METABOLIC PANEL - Abnormal; Notable for the following components:      Result Value   Potassium 3.4 (*)    Glucose, Bld 161 (*)    Calcium 8.5 (*)    All other components within normal limits  CBC  BRAIN NATRIURETIC PEPTIDE  MAGNESIUM  TROPONIN I (HIGH SENSITIVITY)    EKG EKG Interpretation Date/Time:  Monday November 03 2022 18:23:07 EDT Ventricular Rate:  123 PR Interval:    QRS Duration:  150 QT Interval:  359 QTC Calculation: 514 R Axis:   116  Text Interpretation: Atrial flutter/fibrillation RBBB and LPFB Similar to previous Confirmed by Coralee Pesa [phone]) on 11/03/2022 7:00:33 PM  Radiology DG Chest Port 1 View  Result Date: 11/03/2022 CLINICAL DATA:  Shortness of breath. EXAM: PORTABLE CHEST 1 VIEW COMPARISON:  10/30/2022. FINDINGS: Low lung volumes accentuate the pulmonary vasculature and cardiomediastinal silhouette. No consolidation or pulmonary edema. No pleural effusion or pneumothorax. IMPRESSION: Low lung volumes without evidence of acute cardiopulmonary disease. Electronically Signed   By: Orvan Falconer M.D.   On: 11/03/2022 20:07    Procedures .Critical Care  Performed by: Rozelle Logan, DO Authorized by: Rozelle Logan, DO   Critical care provider statement:    Critical care time (minutes):  30   Critical care time was exclusive of:  Separately billable procedures and treating other patients   Critical care was necessary to treat or prevent imminent or life-threatening deterioration of the following conditions:  Circulatory failure   Critical care was time spent personally by me on the following activities:  Development of treatment plan with patient or surrogate, discussions with consultants, evaluation of patient's response to treatment, examination of patient, ordering and review of laboratory studies, ordering and review of radiographic studies, ordering and  performing treatments and interventions, pulse oximetry, re-evaluation of patient's condition and review of old charts   I assumed direction of critical care for this patient from another provider in my specialty: no     Care discussed with: admitting provider       Medications Ordered in ED Medications  diltiazem (CARDIZEM) injection 20 mg (has no administration in time range)    ED Course/ Medical Decision Making/ A&P                                 Medical Decision Making Amount and/or Complexity of Data Reviewed Labs: ordered. Radiology: ordered.  Risk Prescription drug management. Decision regarding hospitalization.   62 year old male with recent newly diagnosed A-fib from an ER visit 3 days ago, newly on Eliquis presents emergency department with fatigue, palpitations  and lightheadedness.  Found to be tachycardic, in atrial fibrillation again, stable blood pressure.  Blood work is reassuring, electrolytes are normal, magnesium is normal, troponin is negative.  BNP is slightly elevated, slight rales at bases on lung auscultation but no overt findings of pulmonary edema on the chest x-ray.  After an IV dose of Cardizem patient was briefly rate controlled but does still continue to have episodes of tachycardia.  He was ambulated in the department and became tachycardic up into 1 15-1 20.  Patient continues to complain of extreme fatigue.  In light of newly diagnosed atrial fibrillation that continues to be symptomatic and uncontrolled we will plan for admission for cardiology consult and possible echo.  Patients evaluation and results requires admission for further treatment and care.  Spoke with hospitalist, reviewed patient's ED course and they accept admission.  Patient agrees with admission plan, offers no new complaints and is stable/unchanged at time of admit.        Final Clinical Impression(s) / ED Diagnoses Final diagnoses:  None    Rx / DC Orders ED  Discharge Orders     None         Rozelle Logan, DO 11/04/22 0030

## 2022-11-04 ENCOUNTER — Observation Stay (HOSPITAL_COMMUNITY): Payer: Medicare Other

## 2022-11-04 ENCOUNTER — Encounter (HOSPITAL_COMMUNITY): Payer: Self-pay | Admitting: Emergency Medicine

## 2022-11-04 DIAGNOSIS — I4510 Unspecified right bundle-branch block: Secondary | ICD-10-CM

## 2022-11-04 DIAGNOSIS — Z91014 Allergy to mammalian meats: Secondary | ICD-10-CM | POA: Diagnosis not present

## 2022-11-04 DIAGNOSIS — I4891 Unspecified atrial fibrillation: Secondary | ICD-10-CM

## 2022-11-04 DIAGNOSIS — F17210 Nicotine dependence, cigarettes, uncomplicated: Secondary | ICD-10-CM | POA: Diagnosis present

## 2022-11-04 DIAGNOSIS — I4819 Other persistent atrial fibrillation: Secondary | ICD-10-CM | POA: Diagnosis not present

## 2022-11-04 DIAGNOSIS — M549 Dorsalgia, unspecified: Secondary | ICD-10-CM | POA: Diagnosis present

## 2022-11-04 DIAGNOSIS — Z981 Arthrodesis status: Secondary | ICD-10-CM | POA: Diagnosis not present

## 2022-11-04 DIAGNOSIS — Z7901 Long term (current) use of anticoagulants: Secondary | ICD-10-CM | POA: Diagnosis not present

## 2022-11-04 DIAGNOSIS — E7800 Pure hypercholesterolemia, unspecified: Secondary | ICD-10-CM

## 2022-11-04 DIAGNOSIS — I4892 Unspecified atrial flutter: Secondary | ICD-10-CM | POA: Diagnosis present

## 2022-11-04 DIAGNOSIS — Z96653 Presence of artificial knee joint, bilateral: Secondary | ICD-10-CM | POA: Diagnosis present

## 2022-11-04 DIAGNOSIS — E876 Hypokalemia: Secondary | ICD-10-CM | POA: Diagnosis present

## 2022-11-04 DIAGNOSIS — I10 Essential (primary) hypertension: Secondary | ICD-10-CM

## 2022-11-04 DIAGNOSIS — Z7985 Long-term (current) use of injectable non-insulin antidiabetic drugs: Secondary | ICD-10-CM | POA: Diagnosis not present

## 2022-11-04 DIAGNOSIS — I517 Cardiomegaly: Secondary | ICD-10-CM | POA: Diagnosis not present

## 2022-11-04 DIAGNOSIS — E785 Hyperlipidemia, unspecified: Secondary | ICD-10-CM | POA: Diagnosis present

## 2022-11-04 DIAGNOSIS — R7303 Prediabetes: Secondary | ICD-10-CM | POA: Diagnosis present

## 2022-11-04 DIAGNOSIS — K219 Gastro-esophageal reflux disease without esophagitis: Secondary | ICD-10-CM | POA: Diagnosis present

## 2022-11-04 DIAGNOSIS — Z79899 Other long term (current) drug therapy: Secondary | ICD-10-CM | POA: Diagnosis not present

## 2022-11-04 DIAGNOSIS — G8929 Other chronic pain: Secondary | ICD-10-CM | POA: Diagnosis present

## 2022-11-04 LAB — CBC
HCT: 44.4 % (ref 39.0–52.0)
Hemoglobin: 14.3 g/dL (ref 13.0–17.0)
MCH: 29.3 pg (ref 26.0–34.0)
MCHC: 32.2 g/dL (ref 30.0–36.0)
MCV: 91 fL (ref 80.0–100.0)
Platelets: UNDETERMINED 10*3/uL (ref 150–400)
RBC: 4.88 MIL/uL (ref 4.22–5.81)
RDW: 14.6 % (ref 11.5–15.5)
WBC: 3.8 10*3/uL — ABNORMAL LOW (ref 4.0–10.5)
nRBC: 0 % (ref 0.0–0.2)

## 2022-11-04 LAB — LIPID PANEL
Cholesterol: 146 mg/dL (ref 0–200)
HDL: 44 mg/dL (ref >40)
LDL Cholesterol: 80 mg/dL (ref 0–99)
Total CHOL/HDL Ratio: 3.3 RATIO
Triglycerides: 111 mg/dL (ref <150)
VLDL: 22 mg/dL (ref 0–40)

## 2022-11-04 LAB — COMPREHENSIVE METABOLIC PANEL
ALT: 13 U/L (ref 0–44)
AST: 12 U/L — ABNORMAL LOW (ref 15–41)
Albumin: 3.2 g/dL — ABNORMAL LOW (ref 3.5–5.0)
Alkaline Phosphatase: 59 U/L (ref 38–126)
Anion gap: 9 (ref 5–15)
BUN: 15 mg/dL (ref 8–23)
CO2: 22 mmol/L (ref 22–32)
Calcium: 8.3 mg/dL — ABNORMAL LOW (ref 8.9–10.3)
Chloride: 105 mmol/L (ref 98–111)
Creatinine, Ser: 0.57 mg/dL — ABNORMAL LOW (ref 0.61–1.24)
GFR, Estimated: >60 mL/min (ref >60)
Glucose, Bld: 108 mg/dL — ABNORMAL HIGH (ref 70–99)
Potassium: 3.8 mmol/L (ref 3.5–5.1)
Sodium: 136 mmol/L (ref 135–145)
Total Bilirubin: 0.4 mg/dL (ref 0.3–1.2)
Total Protein: 6.1 g/dL — ABNORMAL LOW (ref 6.5–8.1)

## 2022-11-04 LAB — TSH: TSH: 3.422 u[IU]/mL (ref 0.350–4.500)

## 2022-11-04 LAB — TROPONIN I (HIGH SENSITIVITY)
Troponin I (High Sensitivity): 3 ng/L (ref <18)
Troponin I (High Sensitivity): 4 ng/L (ref <18)

## 2022-11-04 LAB — MAGNESIUM: Magnesium: 2.2 mg/dL (ref 1.7–2.4)

## 2022-11-04 LAB — GLUCOSE, CAPILLARY
Glucose-Capillary: 115 mg/dL — ABNORMAL HIGH (ref 70–99)
Glucose-Capillary: 120 mg/dL — ABNORMAL HIGH (ref 70–99)

## 2022-11-04 LAB — HIV ANTIBODY (ROUTINE TESTING W REFLEX): HIV Screen 4th Generation wRfx: NONREACTIVE

## 2022-11-04 LAB — ECHOCARDIOGRAM COMPLETE
Calc EF: 59.9 %
Height: 66 in
S' Lateral: 3.2 cm
Single Plane A2C EF: 58.1 %
Single Plane A4C EF: 64.8 %
Weight: 2857.16 oz

## 2022-11-04 LAB — PHOSPHORUS: Phosphorus: 3.6 mg/dL (ref 2.5–4.6)

## 2022-11-04 MED ORDER — IPRATROPIUM BROMIDE 0.02 % IN SOLN: 0.5000 mg | Freq: Four times a day (QID) | RESPIRATORY_TRACT | Status: DC | PRN

## 2022-11-04 MED ORDER — SODIUM CHLORIDE 0.9% FLUSH
3.0000 mL | Freq: Two times a day (BID) | INTRAVENOUS | Status: DC
  Administered 2022-11-04 – 2022-11-07 (×7): 3 mL via INTRAVENOUS

## 2022-11-04 MED ORDER — DULOXETINE HCL 30 MG PO CPEP
30.0000 mg | ORAL_CAPSULE | Freq: Three times a day (TID) | ORAL | Status: DC
  Administered 2022-11-04 – 2022-11-07 (×10): 30 mg via ORAL
  Filled 2022-11-04 (×10): qty 1

## 2022-11-04 MED ORDER — CARVEDILOL 25 MG PO TABS
25.0000 mg | ORAL_TABLET | Freq: Two times a day (BID) | ORAL | Status: DC
  Administered 2022-11-04: 25 mg via ORAL
  Filled 2022-11-04: qty 2

## 2022-11-04 MED ORDER — INSULIN ASPART 100 UNIT/ML IJ SOLN: 0.0000 [IU] | Freq: Every day | INTRAMUSCULAR | Status: DC

## 2022-11-04 MED ORDER — PANTOPRAZOLE SODIUM 40 MG PO TBEC
40.0000 mg | DELAYED_RELEASE_TABLET | Freq: Every evening | ORAL | Status: DC
  Administered 2022-11-04 – 2022-11-06 (×3): 40 mg via ORAL
  Filled 2022-11-04 (×3): qty 1

## 2022-11-04 MED ORDER — LIRAGLUTIDE 18 MG/3ML ~~LOC~~ SOPN: 1.8000 mg | PEN_INJECTOR | Freq: Every day | SUBCUTANEOUS | Status: DC

## 2022-11-04 MED ORDER — ONDANSETRON HCL 4 MG PO TABS: 4.0000 mg | ORAL_TABLET | Freq: Four times a day (QID) | ORAL | Status: DC | PRN

## 2022-11-04 MED ORDER — INSULIN ASPART 100 UNIT/ML IJ SOLN
0.0000 [IU] | Freq: Three times a day (TID) | INTRAMUSCULAR | Status: DC
  Administered 2022-11-06: 2 [IU] via SUBCUTANEOUS

## 2022-11-04 MED ORDER — METOPROLOL TARTRATE 5 MG/5ML IV SOLN
5.0000 mg | Freq: Four times a day (QID) | INTRAVENOUS | Status: DC
  Filled 2022-11-04: qty 5

## 2022-11-04 MED ORDER — ACETAMINOPHEN 325 MG PO TABS: 650.0000 mg | ORAL_TABLET | Freq: Four times a day (QID) | ORAL | Status: DC | PRN

## 2022-11-04 MED ORDER — DILTIAZEM HCL ER COATED BEADS 120 MG PO CP24
120.0000 mg | ORAL_CAPSULE | Freq: Every day | ORAL | Status: DC
  Administered 2022-11-05 – 2022-11-07 (×3): 120 mg via ORAL
  Filled 2022-11-04 (×3): qty 1

## 2022-11-04 MED ORDER — DILTIAZEM HCL 30 MG PO TABS: 30.0000 mg | ORAL_TABLET | Freq: Once | ORAL | Status: DC

## 2022-11-04 MED ORDER — TIZANIDINE HCL 4 MG PO TABS
4.0000 mg | ORAL_TABLET | Freq: Three times a day (TID) | ORAL | Status: DC
  Administered 2022-11-04 – 2022-11-07 (×10): 4 mg via ORAL
  Filled 2022-11-04 (×10): qty 1

## 2022-11-04 MED ORDER — GABAPENTIN 400 MG PO CAPS
800.0000 mg | ORAL_CAPSULE | Freq: Three times a day (TID) | ORAL | Status: DC
  Administered 2022-11-04 – 2022-11-07 (×10): 800 mg via ORAL
  Filled 2022-11-04 (×10): qty 2

## 2022-11-04 MED ORDER — ACETAMINOPHEN 650 MG RE SUPP: 650.0000 mg | Freq: Four times a day (QID) | RECTAL | Status: DC | PRN

## 2022-11-04 MED ORDER — PRAVASTATIN SODIUM 40 MG PO TABS
40.0000 mg | ORAL_TABLET | Freq: Every day | ORAL | Status: DC
  Administered 2022-11-04 – 2022-11-06 (×3): 40 mg via ORAL
  Filled 2022-11-04 (×3): qty 1

## 2022-11-04 MED ORDER — ONDANSETRON HCL 4 MG/2ML IJ SOLN: 4.0000 mg | Freq: Four times a day (QID) | INTRAMUSCULAR | Status: DC | PRN

## 2022-11-04 MED ORDER — METOPROLOL TARTRATE 50 MG PO TABS
50.0000 mg | ORAL_TABLET | Freq: Two times a day (BID) | ORAL | Status: DC
  Administered 2022-11-04 – 2022-11-06 (×6): 50 mg via ORAL
  Filled 2022-11-04 (×7): qty 1

## 2022-11-04 MED ORDER — APIXABAN 5 MG PO TABS
5.0000 mg | ORAL_TABLET | Freq: Two times a day (BID) | ORAL | Status: DC
  Administered 2022-11-04 – 2022-11-07 (×7): 5 mg via ORAL
  Filled 2022-11-04 (×7): qty 1

## 2022-11-04 MED ORDER — ALBUTEROL SULFATE (2.5 MG/3ML) 0.083% IN NEBU: 2.5000 mg | INHALATION_SOLUTION | Freq: Four times a day (QID) | RESPIRATORY_TRACT | Status: DC | PRN

## 2022-11-04 MED ORDER — HYDROCODONE-ACETAMINOPHEN 5-325 MG PO TABS: 1.0000 | ORAL_TABLET | ORAL | Status: DC | PRN

## 2022-11-04 MED ORDER — HYDROMORPHONE HCL 2 MG PO TABS: 2.0000 mg | ORAL_TABLET | Freq: Four times a day (QID) | ORAL | Status: DC | PRN

## 2022-11-04 MED ORDER — BISACODYL 5 MG PO TBEC: 5.0000 mg | DELAYED_RELEASE_TABLET | Freq: Every day | ORAL | Status: DC | PRN

## 2022-11-04 MED ORDER — BACLOFEN 10 MG PO TABS: 20.0000 mg | ORAL_TABLET | Freq: Every morning | ORAL | Status: DC

## 2022-11-04 MED ORDER — ZOLPIDEM TARTRATE 5 MG PO TABS
5.0000 mg | ORAL_TABLET | Freq: Every evening | ORAL | Status: DC | PRN
  Filled 2022-11-04: qty 1

## 2022-11-04 MED ORDER — SENNOSIDES-DOCUSATE SODIUM 8.6-50 MG PO TABS: 1.0000 | ORAL_TABLET | Freq: Every evening | ORAL | Status: DC | PRN

## 2022-11-04 MED ORDER — LOSARTAN POTASSIUM 50 MG PO TABS
100.0000 mg | ORAL_TABLET | Freq: Every morning | ORAL | Status: DC
  Administered 2022-11-04 – 2022-11-07 (×4): 100 mg via ORAL
  Filled 2022-11-04: qty 2
  Filled 2022-11-04: qty 4
  Filled 2022-11-04 (×3): qty 2

## 2022-11-04 NOTE — ED Notes (Signed)
Patient would like to speak with nursing supervisor of hospital

## 2022-11-04 NOTE — Progress Notes (Signed)
  Echocardiogram 2D Echocardiogram has been performed.  Christian [name] 11/04/2022, 12:03 PM

## 2022-11-04 NOTE — ED Notes (Signed)
This RN at bedside at bedside with RN Chyrel Masson. Updated patient as much as possible, apologetic towards situation. Patient upset, verbalizing frustration with hospital system, doctors etc. States he needs to know the exact time when Cardiology will see him or he wants everything to be removed and he will leave. Informed unable to provide the exact time. Advised it is not safe to leave. Patient agrees to wait for nursing supervisor  to come.

## 2022-11-04 NOTE — ED Notes (Signed)
ED TO INPATIENT HANDOFF REPORT  Name/Age/Gender Christian [name] 62 y.o. male  Code Status    Code Status Orders  (From admission, onward)           Start     Ordered   11/04/22 0031  Full code  Continuous       Question:  By:  Answer:  Consent: discussion documented in EHR   11/04/22 0032           Code Status History     Date Active Date Inactive Code Status Order ID Comments User Context   08/12/2021 1615 08/13/2021 1706 Full Code [id number]  Derenda Fennel, [place] Inpatient   01/28/2021 1538 01/29/2021 2205 Full Code [id number]  Derenda Fennel, [place] Inpatient   09/01/2018 1259 09/01/2018 1908 Full Code [id number]  Ranee Gosselin, MD Inpatient   09/01/2018 1259 09/01/2018 1259 Full Code [id number]  Ranee Gosselin, MD Inpatient       Home/SNF/Other Home  Chief Complaint Atrial fibrillation with RVR (HCC) [I48.91]  Level of Care/Admitting Diagnosis ED Disposition     ED Disposition  Admit   Condition  --   Comment  Hospital Area: [facility] [[id number]]  Level of Care: Telemetry [5]  Admit to tele based on following criteria: Complex arrhythmia (Bradycardia/Tachycardia)  May place patient in observation at [facility] or [facility] if equivalent level of care is available:: No  Covid Evaluation: Asymptomatic - no recent exposure (last 10 days) testing not required  Diagnosis: Atrial fibrillation with RVR [facility]) [[id number]]  Admitting Physician: Tonye Royalty [[id number]]  Attending Physician: Tonye Royalty [[id number]]          Medical History Past Medical History:  Diagnosis Date   Chronic back pain    Chronic knee pain    DDD (degenerative disc disease), lumbosacral    History of kidney stones    Hyperlipidemia    Hypertension    Kidney stone    OA (osteoarthritis)    knees   Spinal stenosis, lumbar region with neurogenic claudication    Weakness of extremity    per pt upper and lower extermities due to back issues     Allergies Allergies  Allergen Reactions   Pork-Derived Products     PT DOES NOT EAT PORK    IV Location/Drains/Wounds Patient Lines/Drains/Airways Status     Active Line/Drains/Airways     Name Placement date Placement time Site Days   Peripheral IV 11/03/22 20 G Anterior;Distal;Left;Upper Arm 11/03/22  1827  Arm  1            Labs/Imaging Results for orders placed or performed during the hospital encounter of 11/03/22 (from the past 48 hour(s))  CBC     Status: None   Collection Time: 11/03/22  6:32 PM  Result Value Ref Range   WBC 4.8 4.0 - 10.5 K/uL   RBC 5.45 4.22 - 5.81 MIL/uL   Hemoglobin 16.1 13.0 - 17.0 g/dL   HCT 20.2 54.2 - 70.6 %   MCV 89.0 80.0 - 100.0 fL   MCH 29.5 26.0 - 34.0 pg   MCHC 33.2 30.0 - 36.0 g/dL   RDW 23.7 62.8 - 31.5 %   Platelets 208 150 - 400 K/uL   nRBC 0.0 0.0 - 0.2 %    Comment: Performed at [facility], [address] [place], [place] [address]  Comprehensive metabolic panel     Status: Abnormal   Collection Time: 11/03/22  6:32 PM  Result  Value Ref Range   Sodium 136 135 - 145 mmol/L   Potassium 3.4 (L) 3.5 - 5.1 mmol/L   Chloride 106 98 - 111 mmol/L   CO2 23 22 - 32 mmol/L   Glucose, Bld 161 (H) 70 - 99 mg/dL    Comment: Glucose reference range applies only to samples taken after fasting for at least 8 hours.   BUN 17 8 - 23 mg/dL   Creatinine, Ser 8.46 0.61 - 1.24 mg/dL   Calcium 8.5 (L) 8.9 - 10.3 mg/dL   Total Protein 6.9 6.5 - 8.1 g/dL   Albumin 3.6 3.5 - 5.0 g/dL   AST 16 15 - 41 U/L   ALT 14 0 - 44 U/L   Alkaline Phosphatase 65 38 - 126 U/L   Total Bilirubin 0.6 0.3 - 1.2 mg/dL   GFR, Estimated >96 >29 mL/min    Comment: (NOTE) Calculated using the CKD-EPI Creatinine Equation (2021)    Anion gap 7 5 - 15    Comment: Performed at [facility], [address], [place], [place] [address]  Troponin I (High Sensitivity)     Status: None   Collection Time: 11/03/22  6:32 PM   Result Value Ref Range   Troponin I (High Sensitivity) 3 <18 ng/L    Comment: (NOTE) Elevated high sensitivity troponin I (hsTnI) values and significant  changes across serial measurements may suggest ACS but many other  chronic and acute conditions are known to elevate hsTnI results.  Refer to the "Links" section for chest pain algorithms and additional  guidance. Performed at [facility], [address], [place], [place] [address]   Brain natriuretic peptide     Status: Abnormal   Collection Time: 11/03/22  6:32 PM  Result Value Ref Range   B Natriuretic Peptide 331.2 (H) 0.0 - 100.0 pg/mL    Comment: Performed at [facility], [address] [place], [place] [address]  Magnesium     Status: None   Collection Time: 11/03/22  6:32 PM  Result Value Ref Range   Magnesium 2.1 1.7 - 2.4 mg/dL    Comment: Performed at [facility], [address] [place], [place] [address]  Troponin I (High Sensitivity)     Status: None   Collection Time: 11/03/22 10:28 PM  Result Value Ref Range   Troponin I (High Sensitivity) 3 <18 ng/L    Comment: (NOTE) Elevated high sensitivity troponin I (hsTnI) values and significant  changes across serial measurements may suggest ACS but many other  chronic and acute conditions are known to elevate hsTnI results.  Refer to the "Links" section for chest pain algorithms and additional  guidance. Performed at [facility], [address], [place], [place] [address]   Comprehensive metabolic panel     Status: Abnormal   Collection Time: 11/04/22  6:02 AM  Result Value Ref Range   Sodium 136 135 - 145 mmol/L   Potassium 3.8 3.5 - 5.1 mmol/L   Chloride 105 98 - 111 mmol/L   CO2 22 22 - 32 mmol/L   Glucose, Bld 108 (H) 70 - 99 mg/dL    Comment: Glucose reference range applies only to samples taken after fasting for at least 8 hours.   BUN 15 8 - 23 mg/dL   Creatinine, Ser 4.25  (L) 0.61 - 1.24 mg/dL   Calcium 8.3 (L) 8.9 - 10.3 mg/dL   Total Protein 6.1 (L) 6.5 - 8.1 g/dL  Albumin 3.2 (L) 3.5 - 5.0 g/dL   AST 12 (L) 15 - 41 U/L   ALT 13 0 - 44 U/L   Alkaline Phosphatase 59 38 - 126 U/L   Total Bilirubin 0.4 0.3 - 1.2 mg/dL   GFR, Estimated >21 >30 mL/min    Comment: (NOTE) Calculated using the CKD-EPI Creatinine Equation (2021)    Anion gap 9 5 - 15    Comment: Performed at [facility], [address], [place], [place] [address]  CBC     Status: Abnormal   Collection Time: 11/04/22  6:02 AM  Result Value Ref Range   WBC 3.8 (L) 4.0 - 10.5 K/uL   RBC 4.88 4.22 - 5.81 MIL/uL   Hemoglobin 14.3 13.0 - 17.0 g/dL   HCT 46.9 62.9 - 52.8 %   MCV 91.0 80.0 - 100.0 fL   MCH 29.3 26.0 - 34.0 pg   MCHC 32.2 30.0 - 36.0 g/dL   RDW 41.3 24.4 - 01.0 %   Platelets PLATELET CLUMPS NOTED ON SMEAR, UNABLE TO ESTIMATE 150 - 400 K/uL    Comment: Immature Platelet Fraction may be clinically indicated, consider ordering this additional test [id number]    nRBC 0.0 0.0 - 0.2 %    Comment: Performed at [facility], [address] [place], [place] [address]  Lipid panel     Status: None   Collection Time: 11/04/22  6:02 AM  Result Value Ref Range   Cholesterol 146 0 - 200 mg/dL   Triglycerides 425 <956 mg/dL   HDL 44 >38 mg/dL   Total CHOL/HDL Ratio 3.3 RATIO   VLDL 22 0 - 40 mg/dL   LDL Cholesterol 80 0 - 99 mg/dL    Comment:        Total Cholesterol/HDL:CHD Risk Coronary Heart Disease Risk Table                     Men   Women  1/2 Average Risk   3.4   3.3  Average Risk       5.0   4.4  2 X Average Risk   9.6   7.1  3 X Average Risk  23.4   11.0        Use the calculated Patient Ratio above and the CHD Risk Table to determine the patient's CHD Risk.        ATP III CLASSIFICATION (LDL):  <100     mg/dL   Optimal  756-433  mg/dL   Near or Above                    Optimal  130-159  mg/dL   Borderline  295-188  mg/dL    High  >416     mg/dL   Very High Performed at [facility], [address], [place], [place] [address]   Troponin I (High Sensitivity)     Status: None   Collection Time: 11/04/22  6:02 AM  Result Value Ref Range   Troponin I (High Sensitivity) 3 <18 ng/L    Comment: (NOTE) Elevated high sensitivity troponin I (hsTnI) values and significant  changes across serial measurements may suggest ACS but many other  chronic and acute conditions are known to elevate hsTnI results.  Refer to the "Links" section for chest pain algorithms and additional  guidance. Performed at [facility], [address] [place], [place] [address]   Magnesium  Status: None   Collection Time: 11/04/22  6:02 AM  Result Value Ref Range   Magnesium 2.2 1.7 - 2.4 mg/dL    Comment: Performed at [facility], [address], [place], [place] [address]  Phosphorus     Status: None   Collection Time: 11/04/22  6:02 AM  Result Value Ref Range   Phosphorus 3.6 2.5 - 4.6 mg/dL    Comment: Performed at [facility], [address], [place], [place] [address]  TSH     Status: None   Collection Time: 11/04/22  6:55 AM  Result Value Ref Range   TSH 3.422 0.350 - 4.500 uIU/mL    Comment: Performed by a 3rd Generation assay with a functional sensitivity of <=0.01 uIU/mL. Performed at [facility], [address], [place], [place] [address]    DG Chest Port 1 View  Result Date: 11/03/2022 CLINICAL DATA:  Shortness of breath. EXAM: PORTABLE CHEST 1 VIEW COMPARISON:  10/30/2022. FINDINGS: Low lung volumes accentuate the pulmonary vasculature and cardiomediastinal silhouette. No consolidation or pulmonary edema. No pleural effusion or pneumothorax. IMPRESSION: Low lung volumes without evidence of acute cardiopulmonary disease. Electronically Signed   By: Orvan Falconer M.D.   On: 11/03/2022 20:07    Pending Labs Unresulted Labs  (From admission, onward)     Start     Ordered   11/04/22 0730  HIV Antibody (routine testing w rflx)  Once,   R        11/04/22 0730            Vitals/Pain Today's Vitals   11/04/22 0430 11/04/22 0530 11/04/22 0716 11/04/22 1018  BP:  (!) 141/74 (!) 155/79 (!) 136/111  Pulse: (!) 103 84 67 97  Resp: 19 (!) 21 (!) 22 20  Temp:   98.3 F (36.8 C) 98.4 F (36.9 C)  TempSrc:   Oral Oral  SpO2: 100% 98% 98% 99%  Weight:      Height:      PainSc:        Isolation Precautions No active isolations  Medications Medications  diltiazem (CARDIZEM) tablet 30 mg (0 mg Oral Hold 11/04/22 0153)  carvedilol (COREG) tablet 25 mg (25 mg Oral Given 11/04/22 0942)  DULoxetine (CYMBALTA) DR capsule 30 mg (30 mg Oral Given 11/04/22 0943)  gabapentin (NEURONTIN) capsule 800 mg (800 mg Oral Given 11/04/22 0941)  pantoprazole (PROTONIX) EC tablet 40 mg (has no administration in time range)  losartan (COZAAR) tablet 100 mg (100 mg Oral Given 11/04/22 0941)  baclofen (LIORESAL) tablet 20 mg (20 mg Oral Not Given 11/04/22 0849)  liraglutide (VICTOZA) SOPN 1.8 mg (1.8 mg Subcutaneous Not Given 11/04/22 0849)  tiZANidine (ZANAFLEX) tablet 4 mg (4 mg Oral Given 11/04/22 1046)  apixaban (ELIQUIS) tablet 5 mg (5 mg Oral Given 11/04/22 0944)  pravastatin (PRAVACHOL) tablet 40 mg (has no administration in time range)  HYDROmorphone (DILAUDID) tablet 2-4 mg (has no administration in time range)  sodium chloride flush (NS) 0.9 % injection 3 mL (3 mLs Intravenous Given 11/04/22 1046)  acetaminophen (TYLENOL) tablet 650 mg (has no administration in time range)    Or  acetaminophen (TYLENOL) suppository 650 mg (has no administration in time range)  HYDROcodone-acetaminophen (NORCO/VICODIN) 5-325 MG per tablet 1-2 tablet (has no administration in time range)  zolpidem (AMBIEN) tablet 5 mg (has no administration in time range)  senna-docusate (Senokot-S) tablet 1 tablet (has no administration in time range)   bisacodyl (DULCOLAX) EC tablet  5 mg (has no administration in time range)  ondansetron (ZOFRAN) tablet 4 mg (has no administration in time range)    Or  ondansetron (ZOFRAN) injection 4 mg (has no administration in time range)  albuterol (PROVENTIL) (2.5 MG/3ML) 0.083% nebulizer solution 2.5 mg (has no administration in time range)  ipratropium (ATROVENT) nebulizer solution 0.5 mg (has no administration in time range)  metoprolol tartrate (LOPRESSOR) injection 5 mg (5 mg Intravenous Not Given 11/04/22 1130)  diltiazem (CARDIZEM CD) 24 hr capsule 120 mg (has no administration in time range)  diltiazem (CARDIZEM) injection 20 mg (20 mg Intravenous Given 11/03/22 2218)    Mobility walks

## 2022-11-04 NOTE — Hospital Course (Addendum)
62 year old with history of chronic pain, HTN, HLD, osteoarthritis, spinal stenosis with recent diagnosis of A-fib started on Eliquis 3 days prior to admission comes to the hospital for palpitations.  Patient was recently seen in ED and diagnosed with atrial fibrillation and discharged home on Eliquis.  Patient seen by cardiology, changed to Lopressor.  TEE cardioversion was scheduled on 8/16 but prior to his transportation patient converted back to normal sinus rhythm therefore he was canceled.  Cleared by cardiology to be discharged home.  Will discontinue Coreg and Norvasc.  Patient is now on Cardizem and metoprolol.  Advised him to continue his Eliquis.  He is also been advised to hold off on his AV nodal blockers if his heart rate stays consistently below 70.  Will follow-up outpatient with cardiology.  Medically stable for discharge.     Assessment & Plan:  Principal Problem:   Atrial fibrillation (HCC) Active Problems:   Atrial flutter with rapid ventricular response (HCC)      Atrial fibrillation with RVR Intermittent A-fib with RVR.  Currently on Cardizem and metoprolol.  For now patient was Eliquis, seen by cardiology who is planning for TEE cardioversion on 8/16.  TSH is normal.  Echocardiogram shows EF of 60%.   Mild hypokalemia Replete and monitor electrolytes   History of chronic pain - Continued baclofen, gabapentin, Dilaudid, tizanidine, Cymbalta   History of prediabetes On Victoza at admission.  A1c 5.8   History of GERD - Continue Protonix   History of hyperlipidemia - Continue lovastatin   History of hypertension Currently on Cardizem, losartan, metoprolol.  IV as needed   DVT prophylaxis: apixaban (ELIQUIS) tablet 5 mg  Code Status: Full code Family Communication:   Status is: Inpatient Cardiology planning on inpatient cardioversion on 8/16

## 2022-11-04 NOTE — ED Notes (Signed)
Patient verbalized frustration. States he is not happy with this hospital. Patient states he has been here since yesterday with no update on when he will see cardiology. Advised will reach out to MD for update. Patient states he left the hospital Friday and has been trying to get an appointment with cardiology after waiting for them to call his as he was told. He never received a call or was not able to get through to them. Patient standing at the nursing station, demanding to speak with someone in charge. Charge nurse Sheria Lang aware.

## 2022-11-04 NOTE — ED Notes (Signed)
MD Rhona Leavens confirmed patient will see cardiology today, unsure of time

## 2022-11-04 NOTE — Progress Notes (Signed)
This RN updated patient that [facility] was reaching out to cardiologist on call. Echo tech in room w/ pt starting a procedure at this time. Pt back on EKG monitor.

## 2022-11-04 NOTE — Plan of Care (Signed)
  Problem: Education: Goal: Knowledge of disease or condition will improve Outcome: Progressing   Problem: Education: Goal: Knowledge of General Education information will improve Description: Including pain rating scale, medication(s)/side effects and non-pharmacologic comfort measures Outcome: Progressing   Problem: Health Behavior/Discharge Planning: Goal: Ability to manage health-related needs will improve Outcome: Progressing   Problem: Clinical Measurements: Goal: Respiratory complications will improve Outcome: Progressing Goal: Cardiovascular complication will be avoided Outcome: Progressing

## 2022-11-04 NOTE — H&P (Signed)
History and Physical   [facility] [place] [facility] Admission History and Physical [employer], D.O.    Patient Name: Christian [name] MR#: [id number] Date of Birth: 01-Mar-1961 Date of Admission: 11/03/2022  Referring MD/NP/PA: Dr. Wilkie Aye Primary Care Physician: Laqueta Due., MD  Chief Complaint:  Chief Complaint  Patient presents with   Chest Pain   Shortness of Breath    Since Friday    HPI: Christian [name] is a 62 y.o. male with a known history of chronic pain, hypertension, hyperlipidemia, osteoarthritis, spinal stenosis, recent diagnosis of atrial fibrillation started on Eliquis 3 days ago presents to the emergency department for evaluation of palpitations.  Patient was in a usual state of health until 3 days ago he was diagnosed with atrial fibrillation in the emergency department after presenting with palpitations.  He was placed on Eliquis and discharged home and continued his regular Coreg.  He returns today with recurrent symptoms which are in fact worse including shortness of breath, chest pain, fatigue and palpitations.  On my evaluation he is feeling much better. HR still low 100s  EMS/ED Course: Patient received Cardizem. Medical admission has been requested for further management of atrial fibrillation with RVR.  Review of Systems:  CONSTITUTIONAL: Positive fatigue.  No fever/chills, weakness, weight gain/loss, headache. EYES: No blurry or double vision. ENT: No tinnitus, postnasal drip, redness or soreness of the oropharynx. RESPIRATORY: Positive dyspnea no cough,  wheeze.  No hemoptysis.  CARDIOVASCULAR: Positive chest pain, palpitations, negative syncope, orthopnea. No lower extremity edema.  GASTROINTESTINAL: No nausea, vomiting, abdominal pain, diarrhea, constipation.  No hematemesis, melena or hematochezia. GENITOURINARY: No dysuria, frequency, hematuria. ENDOCRINE: No polyuria or nocturia. No heat or cold intolerance. HEMATOLOGY: No anemia, bruising,  bleeding. INTEGUMENTARY: No rashes, ulcers, lesions. MUSCULOSKELETAL: No arthritis, gout. NEUROLOGIC: No numbness, tingling, ataxia, seizure-type activity, weakness. PSYCHIATRIC: No anxiety, depression, insomnia.   Past Medical History:  Diagnosis Date   Chronic back pain    Chronic knee pain    DDD (degenerative disc disease), lumbosacral    History of kidney stones    Hyperlipidemia    Hypertension    Kidney stone    OA (osteoarthritis)    knees   Spinal stenosis, lumbar region with neurogenic claudication    Weakness of extremity    per pt upper and lower extermities due to back issues    Past Surgical History:  Procedure Laterality Date   ANTERIOR CERVICAL DECOMPRESSION/DISCECTOMY FUSION 4 LEVELS N/A 12/02/2012   Procedure: Cervical three-four, Cervical four-five, Cervical five-six, Cervical six-seven Anterior cervical decompression/diskectomy/fusion;  Surgeon: Maeola Harman, MD;  Location: [facility];  Service: Neurosurgery;  Laterality: N/A;  Cervical three-four, Cervical four-five, Cervical five-six, Cervical six-seven Anterior cervical decompression/diskectomy/fusion   KNEE ARTHROSCOPY WITH MEDIAL MENISECTOMY Left 09/01/2018   Procedure: KNEE ARTHROSCOPY WITH MEDIAL MENISECTOMY;  Surgeon: Ranee Gosselin, MD;  Location: [facility] [place];  Service: Orthopedics;  Laterality: Left;    LUMBAR DISC SURGERY  12/20/2015   L4-5   TOTAL KNEE ARTHROPLASTY Right 01/28/2021   Procedure: TOTAL KNEE ARTHROPLASTY;  Surgeon: Ollen Gross, MD;  Location: [facility];  Service: Orthopedics;  Laterality: Right;   TOTAL KNEE ARTHROPLASTY Left 08/12/2021   Procedure: TOTAL KNEE ARTHROPLASTY;  Surgeon: Ollen Gross, MD;  Location: [facility];  Service: Orthopedics;  Laterality: Left;     reports that he has been smoking cigarettes. He has a 7.5 pack-year smoking history. He has never used smokeless tobacco. He reports that he does not  drink alcohol and does not use drugs.  Allergies   Allergen Reactions   Pork-Derived Products     PT DOES NOT EAT PORK    History reviewed. No pertinent family history.  Prior to Admission medications   Medication Sig Start Date End Date Taking? Authorizing Provider  apixaban (ELIQUIS) 5 MG TABS tablet Take 1 tablet (5 mg total) by mouth 2 (two) times daily. 10/31/22 11/30/22  Tilden Fossa, MD  baclofen (LIORESAL) 20 MG tablet Take 20 mg by mouth in the morning.    [provider]  carvedilol (COREG) 25 MG tablet Take 25 mg by mouth 2 (two) times daily with a meal.    [provider]  DULoxetine (CYMBALTA) 30 MG capsule Take 30 mg by mouth 3 (three) times daily.    [provider]  gabapentin (NEURONTIN) 800 MG tablet Take 800 mg by mouth in the morning, at noon, and at bedtime.    [provider]  HYDROmorphone (DILAUDID) 2 MG tablet Take 1-2 tablets (2-4 mg total) by mouth every 6 (six) hours as needed for severe pain. Take lowest dose possible and taper off medication as tolerated. 08/13/21   Edmisten, Kristie L, PA  losartan (COZAAR) 100 MG tablet Take 100 mg by mouth in the morning.    [provider]  lovastatin (MEVACOR) 40 MG tablet Take 40 mg by mouth at bedtime.    [provider]  pantoprazole (PROTONIX) 40 MG tablet Take 40 mg by mouth every evening.    [provider]  tiZANidine (ZANAFLEX) 4 MG tablet Take 4 mg by mouth in the morning, at noon, and at bedtime. 07/11/21   [provider]  VICTOZA 18 MG/3ML SOPN Inject 1.8 mg into the skin in the morning. 01/08/21   [provider]    Physical Exam: Vitals:   11/03/22 2200 11/03/22 2215 11/03/22 2230 11/03/22 2231  BP: (!) 149/79 (!) 142/84 133/71   Pulse: 67 97 (!) 50 83  Resp: (!) 26 (!) 22 11 (!) 21  Temp: 97.9 F (36.6 C)     SpO2: 99% 99% 99% 99%  Weight:      Height:        GENERAL: 62 y.o.-year-old male patient, well-developed, well-nourished lying in the bed in no acute distress.   Pleasant and cooperative.   HEENT: Head atraumatic, normocephalic. Pupils equal. Mucus membranes moist. NECK: Supple. No JVD. CHEST: Minich breath sounds at the bases no wheezing, rales, rhonchi or crackles. No use of accessory muscles of respiration.  No reproducible chest wall tenderness.  CARDIOVASCULAR: Irregularly irregular S1, S2 normal. No murmurs, rubs, or gallops. Cap refill <2 seconds. Pulses intact distally.  ABDOMEN: Soft, nondistended, nontender. No rebound, guarding, rigidity. Normoactive bowel sounds present in all four quadrants.  EXTREMITIES: No pedal edema, cyanosis, or clubbing. No calf tenderness or Homan's sign.  NEUROLOGIC: The patient is alert and oriented x 3. Cranial nerves II through XII are grossly intact with no focal sensorimotor deficit. PSYCHIATRIC:  Normal affect, mood, thought content. SKIN: Warm, dry, and intact without obvious rash, lesion, or ulcer.    Labs on Admission:  CBC: Recent Labs  Lab 10/30/22 2202 11/03/22 1832  WBC 5.1 4.8  NEUTROABS 1.7  --   HGB 15.4 16.1  HCT 45.9 48.5  MCV 87.1 89.0  PLT 221 208   Basic Metabolic Panel: Recent Labs  Lab 10/30/22 2202 11/03/22 1832  NA 139 136  K 3.8 3.4*  CL 108 106  CO2  23 23  GLUCOSE 143* 161*  BUN 26* 17  CREATININE 0.58* 0.74  CALCIUM 8.8* 8.5*  MG  --  2.1   GFR: Estimated Creatinine Clearance: 95.7 mL/min (by C-G formula based on SCr of 0.74 mg/dL). Liver Function Tests: Recent Labs  Lab 11/03/22 1832  AST 16  ALT 14  ALKPHOS 65  BILITOT 0.6  PROT 6.9  ALBUMIN 3.6   No results for input(s): "LIPASE", "AMYLASE" in the last 168 hours. No results for input(s): "AMMONIA" in the last 168 hours. Coagulation Profile: No results for input(s): "INR", "PROTIME" in the last 168 hours. Cardiac Enzymes: No results for input(s): "CKTOTAL", "CKMB", "CKMBINDEX", "TROPONINI" in the last 168 hours. BNP (last 3 results) No results for input(s): "PROBNP" in the last 8760  hours. HbA1C: No results for input(s): "HGBA1C" in the last 72 hours. CBG: No results for input(s): "GLUCAP" in the last 168 hours. Lipid Profile: No results for input(s): "CHOL", "HDL", "LDLCALC", "TRIG", "CHOLHDL", "LDLDIRECT" in the last 72 hours. Thyroid Function Tests: No results for input(s): "TSH", "T4TOTAL", "FREET4", "T3FREE", "THYROIDAB" in the last 72 hours. Anemia Panel: No results for input(s): "VITAMINB12", "FOLATE", "FERRITIN", "TIBC", "IRON", "RETICCTPCT" in the last 72 hours. Urine analysis:    Component Value Date/Time   COLORURINE YELLOW 10/18/2011 2302   APPEARANCEUR TURBID (A) 10/18/2011 2302   LABSPEC 1.020 10/18/2011 2302   PHURINE 5.5 10/18/2011 2302   GLUCOSEU NEGATIVE 10/18/2011 2302   HGBUR LARGE (A) 10/18/2011 2302   BILIRUBINUR NEGATIVE 10/18/2011 2302   KETONESUR NEGATIVE 10/18/2011 2302   PROTEINUR NEGATIVE 10/18/2011 2302   UROBILINOGEN 0.2 10/18/2011 2302   NITRITE NEGATIVE 10/18/2011 2302   LEUKOCYTESUR SMALL (A) 10/18/2011 2302   Sepsis Labs: @LABRCNTIP (procalcitonin:4,lacticidven:4) )No results found for this or any previous visit (from the past 240 hour(s)).   Radiological Exams on Admission: DG Chest Port 1 View  Result Date: 11/03/2022 CLINICAL DATA:  Shortness of breath. EXAM: PORTABLE CHEST 1 VIEW COMPARISON:  10/30/2022. FINDINGS: Low lung volumes accentuate the pulmonary vasculature and cardiomediastinal silhouette. No consolidation or pulmonary edema. No pleural effusion or pneumothorax. IMPRESSION: Low lung volumes without evidence of acute cardiopulmonary disease. Electronically Signed   By: Orvan Falconer M.D.   On: 11/03/2022 20:07    EKG: Atrial fibrillation with RVR at 123 bpm, right bundle branch block  Assessment/Plan  This is a 62 y.o. male with a history of chronic pain, GERD hypertension, hyperlipidemia, osteoarthritis, spinal stenosis, prediabetes recent diagnosis of atrial fibrillation now being admitted with:  #.   Atrial fibrillation with RVR -Admit telemetry observation - Add p.o. Cardizem - Continue Coreg and Eliquis - Check echo - Check TSH, lipids, troponins - Cardiology consult  #.  Mild hypokalemia - Replace orally  #.  History of chronic pain - Continue baclofen, gabapentin, Dilaudid, tizanidine, Cymbalta  #. History of prediabetes - Continue Accu-Cheks ACHS -Continue Victoza  #. History of GERD - Continue Protonix  #. History of hyperlipidemia - Continue lovastatin  #. History of hypertension - Continue losartan   Admission status: Observation telemetry IV Fluids: Hep-Lock Diet/Nutrition: Heart healthy, carb controlled Consults called: Cardiology DVT Px: Eliquis, SCDs and early ambulation. Code Status: Full Code  Disposition Plan: To home in less than 24 hours  All the records are reviewed and case discussed with ED provider. Management plans discussed with the patient and/or family who express understanding and agree with plan of care.    D.O. on 11/04/2022 at 12:26 AM CC: Primary care physician; Laqueta Due.,  MD   11/04/2022, 12:26 AM

## 2022-11-04 NOTE — ED Notes (Signed)
Charge RN Water engineer at bedside

## 2022-11-04 NOTE — Consult Note (Signed)
Cardiology Consultation   Patient ID: Christian [name] MRN: [id number]; DOB: 01-12-1961  Admit date: 11/03/2022 Date of Consult: 11/04/2022  PCP:  Laqueta Due., MD   [facility] Providers Cardiologist:  new to Dr Mayford Knife   Patient Profile:   Christian [name] is a 62 y.o. male with a hx of HTN, HLD, chronic back pain with hx of back surgery, osteoarthritis,  who is being seen 11/04/2022 for the evaluation of A fib  at the request of Dr Rhona Leavens.  History of Present Illness:   Christian [name] with above PMH presented to the ER last night complaining of heart palpitation.  Patient states he initially visited ER on 10/30/2022 for shortness of breath, fast heart rate.  He was diagnosed with atrial fibrillation, noted the heart rate up to 140s.  He was started on Eliquis and advised to continue home medication carvedilol.  He was discharged with referral to atrial fibrillation clinic.  He states his symptom had never improved and therefore return to the ER last night.  He states he has not received any call for A-fib clinic as well.  He denied any history of cardiac disease such as CAD, MI, CHF, arrhythmia.  Thursday, 10/30/2022 morning, he woke up feeling tired and short of breath.  He felt her heart was racing.  He recalls having some intermittent dizziness with position change.  He denied any chest pain, syncope, orthopnea, PND, leg edema.  He takes carvedilol 25 mg twice daily and losartan 100 mg daily for high blood pressure.  He has been compliant with Eliquis.  He denied history of stroke.   Admission diagnostic on 11/03/2022 revealed hypokalemia 3.4, CMP otherwise grossly unremarkable.  BNP 331.  High sensitive troponin 3>3>3.  CBC grossly unremarkable.  TSH WNL.  Chest x-ray revealed no acute cardiopulmonary disease.  EKG and telemetry revealed A-fib RVR.  Echocardiogram completed today revealed LVEF 55 to 60%, no RWMA, indeterminate diastolic parameter, normal RV, no significant valvular  disease.  She is consulted for further evaluation today     Past Medical History:  Diagnosis Date   Chronic back pain    Chronic knee pain    DDD (degenerative disc disease), lumbosacral    History of kidney stones    Hyperlipidemia    Hypertension    Kidney stone    OA (osteoarthritis)    knees   Spinal stenosis, lumbar region with neurogenic claudication    Weakness of extremity    per pt upper and lower extermities due to back issues    Past Surgical History:  Procedure Laterality Date   ANTERIOR CERVICAL DECOMPRESSION/DISCECTOMY FUSION 4 LEVELS N/A 12/02/2012   Procedure: Cervical three-four, Cervical four-five, Cervical five-six, Cervical six-seven Anterior cervical decompression/diskectomy/fusion;  Surgeon: Maeola Harman, MD;  Location: [facility];  Service: Neurosurgery;  Laterality: N/A;  Cervical three-four, Cervical four-five, Cervical five-six, Cervical six-seven Anterior cervical decompression/diskectomy/fusion   KNEE ARTHROSCOPY WITH MEDIAL MENISECTOMY Left 09/01/2018   Procedure: KNEE ARTHROSCOPY WITH MEDIAL MENISECTOMY;  Surgeon: Ranee Gosselin, MD;  Location: [facility];  Service: Orthopedics;  Laterality: Left;    LUMBAR DISC SURGERY  12/20/2015   L4-5   TOTAL KNEE ARTHROPLASTY Right 01/28/2021   Procedure: TOTAL KNEE ARTHROPLASTY;  Surgeon: Ollen Gross, MD;  Location: [facility];  Service: Orthopedics;  Laterality: Right;   TOTAL KNEE ARTHROPLASTY Left 08/12/2021   Procedure: TOTAL KNEE ARTHROPLASTY;  Surgeon: Ollen Gross, MD;  Location: [facility];  Service: Orthopedics;  Laterality:  Left;     Home Medications:  Prior to Admission medications   Medication Sig Start Date End Date Taking? Authorizing Provider  apixaban (ELIQUIS) 5 MG TABS tablet Take 1 tablet (5 mg total) by mouth 2 (two) times daily. 10/31/22 11/30/22  Tilden Fossa, MD  baclofen (LIORESAL) 20 MG tablet Take 20 mg by mouth in the morning.    [provider]   carvedilol (COREG) 25 MG tablet Take 25 mg by mouth 2 (two) times daily with a meal.    [provider]  DULoxetine (CYMBALTA) 30 MG capsule Take 30 mg by mouth 3 (three) times daily.    [provider]  gabapentin (NEURONTIN) 800 MG tablet Take 800 mg by mouth in the morning, at noon, and at bedtime.    [provider]  HYDROmorphone (DILAUDID) 2 MG tablet Take 1-2 tablets (2-4 mg total) by mouth every 6 (six) hours as needed for severe pain. Take lowest dose possible and taper off medication as tolerated. 08/13/21   Edmisten, Kristie L, PA  losartan (COZAAR) 100 MG tablet Take 100 mg by mouth in the morning.    [provider]  lovastatin (MEVACOR) 40 MG tablet Take 40 mg by mouth at bedtime.    [provider]  pantoprazole (PROTONIX) 40 MG tablet Take 40 mg by mouth every evening.    [provider]  tiZANidine (ZANAFLEX) 4 MG tablet Take 4 mg by mouth in the morning, at noon, and at bedtime. 07/11/21   [provider]  VICTOZA 18 MG/3ML SOPN Inject 1.8 mg into the skin in the morning. 01/08/21   [provider]    Inpatient Medications: Scheduled Meds:  apixaban  5 mg Oral BID   [START ON 11/05/2022] diltiazem  120 mg Oral Daily   diltiazem  30 mg Oral Once   DULoxetine  30 mg Oral TID   gabapentin  800 mg Oral TID   liraglutide  1.8 mg Subcutaneous Daily   losartan  100 mg Oral q AM   metoprolol tartrate  5 mg Intravenous Q6H   metoprolol tartrate  50 mg Oral BID   pantoprazole  40 mg Oral QPM   pravastatin  40 mg Oral q1800   sodium chloride flush  3 mL Intravenous Q12H   tiZANidine  4 mg Oral TID   Continuous Infusions:  PRN Meds: acetaminophen **OR** acetaminophen, albuterol, bisacodyl, HYDROcodone-acetaminophen, HYDROmorphone, ipratropium, ondansetron **OR** ondansetron (ZOFRAN) IV, senna-docusate, zolpidem  Allergies:    Allergies  Allergen Reactions   Pork-Derived Products     PT DOES NOT EAT PORK     Social History:   Social History   Socioeconomic History   Marital status: Married    Spouse name: Mudlogger   Number of children: 4   Years of education: College   Highest education level: Not on file  Occupational History    Employer: [employer]  Tobacco Use   Smoking status: Every Day    Current packs/day: 0.25    Average packs/day: 0.3 packs/day for 30.0 years (7.5 ttl pk-yrs)    Types: Cigarettes   Smokeless tobacco: Never  Vaping Use   Vaping status: Never Used  Substance and Sexual Activity   Alcohol use: Never   Drug use: Never   Sexual activity: Not on file  Other Topics Concern   Not on file  Social History Narrative   Lives at home with family.   Caffeine Use: 1 cup daily   Social Determinants of Health  Financial Resource Strain: Not on file  Food Insecurity: No Food Insecurity (11/04/2022)   Hunger Vital Sign    Worried About Running Out of Food in the Last Year: Never true    Ran Out of Food in the Last Year: Never true  Transportation Needs: No Transportation Needs (11/04/2022)   PRAPARE - Administrator, Civil Service (Medical): No    Lack of Transportation (Non-Medical): No  Physical Activity: Not on file  Stress: Not on file  Social Connections: Not on file  Intimate Partner Violence: Not At Risk (11/04/2022)   Humiliation, Afraid, Rape, and Kick questionnaire    Fear of Current or Ex-Partner: No    Emotionally Abused: No    Physically Abused: No    Sexually Abused: No    Family History:   History reviewed. No pertinent family history.   ROS:  Constitutional: fatigue  Eyes: Denied vision change or loss Ears/Nose/Mouth/Throat: Denied ear ache, sore throat, coughing, sinus pain Cardiovascular: see HPI  Respiratory:see HPI  Gastrointestinal: Denied nausea, vomiting, abdominal pain, diarrhea Genital/Urinary: Denied dysuria, hematuria, urinary frequency/urgency Musculoskeletal: Denied muscle ache, joint pain, weakness Skin:  Denied rash, wound Neuro: Denied headache, dizziness, syncope Psych: Denied history of depression/anxiety  Endocrine: Denied history of diabetes   Physical Exam/Data:   Vitals:   11/04/22 0530 11/04/22 0716 11/04/22 1018 11/04/22 1238  BP: (!) 141/74 (!) 155/79 (!) 136/111 (!) 134/91  Pulse: 84 67 97 70  Resp: (!) 21 (!) 22 20 18   Temp:  98.3 F (36.8 C) 98.4 F (36.9 C) 97.9 F (36.6 C)  TempSrc:  Oral Oral Oral  SpO2: 98% 98% 99% 99%  Weight:      Height:       No intake or output data in the 24 hours ending 11/04/22 1329    11/03/2022    6:09 PM 10/30/2022    9:52 PM 08/12/2021    9:29 AM  Last 3 Weights  Weight (lbs) 178 lb 9.2 oz 180 lb 181 lb  Weight (kg) 81 kg 81.647 kg 82.101 kg     Body mass index is 28.82 kg/m.   Vitals:  Vitals:   11/04/22 1018 11/04/22 1238  BP: (!) 136/111 (!) 134/91  Pulse: 97 70  Resp: 20 18  Temp: 98.4 F (36.9 C) 97.9 F (36.6 C)  SpO2: 99% 99%   General Appearance: In no apparent distress, laying in bed, well nourished  HEENT: Normocephalic, atraumatic.  Neck: Supple, trachea midline, no JVDs Cardiovascular: Irregularly irregular, S1-S2, no murmur Respiratory: Resting breathing unlabored, lungs sounds clear to auscultation bilaterally, no use of accessory muscles. On room air.  No wheezes, rales or rhonchi.   Gastrointestinal: Bowel sounds positive, abdomen soft, non-tender, non-distended.  Extremities: Able to move all extremities in bed without difficulty, no edema of bilateral lower extremity  Musculoskeletal: Normal muscle bulk and tone Skin: Intact, warm, dry. No rashes or petechiae noted in exposed areas.  Neurologic: Alert, oriented to person, place and time. Fluent speech, no cognitive deficit, no gross focal neuro deficit Psychiatric: Normal affect. Mood is appropriate.    EKG:  The EKG was personally reviewed and demonstrates:    EKG from 10/30/2022 revealed A-fib RVR, 1 1 1  bpm, RBBB  EKG from 10/31/2022 revealed  A-fib RVR 109 bpm, RBBB  EKG from 11/03/2022 revealed A-fib RVR 123 bpm, RBBB  Telemetry:  Telemetry was personally reviewed and demonstrates:    A-fib with ventricular rate 100s   Relevant CV Studies:  Echo 11/04/22:    1. Left ventricular ejection fraction, by estimation, is 55 to 60%. The  left ventricle has normal function. The left ventricle has no regional  wall motion abnormalities. There is severe concentric left ventricular  hypertrophy. Left ventricular diastolic   parameters are indeterminate.   2. Right ventricular systolic function is normal. The right ventricular  size is normal.   3. The mitral valve is normal in structure. No evidence of mitral valve  regurgitation. No evidence of mitral stenosis.   4. The aortic valve was not well visualized. Aortic valve regurgitation  is not visualized. No aortic stenosis is present.   5. The inferior vena cava is normal in size with <50% respiratory  variability, suggesting right atrial pressure of 8 mmHg.   Comparison(s): No prior Echocardiogram.    Laboratory Data:  High Sensitivity Troponin:   Recent Labs  Lab 10/30/22 2202 10/30/22 2338 11/03/22 1832 11/03/22 2228 11/04/22 0602  TROPONINIHS 11 9 3 3 3      Chemistry Recent Labs  Lab 10/30/22 2202 11/03/22 1832 11/04/22 0602  NA 139 136 136  K 3.8 3.4* 3.8  CL 108 106 105  CO2 23 23 22   GLUCOSE 143* 161* 108*  BUN 26* 17 15  CREATININE 0.58* 0.74 0.57*  CALCIUM 8.8* 8.5* 8.3*  MG  --  2.1 2.2  GFRNONAA >60 >60 >60  ANIONGAP 8 7 9     Recent Labs  Lab 11/03/22 1832 11/04/22 0602  PROT 6.9 6.1*  ALBUMIN 3.6 3.2*  AST 16 12*  ALT 14 13  ALKPHOS 65 59  BILITOT 0.6 0.4   Lipids  Recent Labs  Lab 11/04/22 0602  CHOL 146  TRIG 111  HDL 44  LDLCALC 80  CHOLHDL 3.3    Hematology Recent Labs  Lab 10/30/22 2202 11/03/22 1832 11/04/22 0602  WBC 5.1 4.8 3.8*  RBC 5.27 5.45 4.88  HGB 15.4 16.1 14.3  HCT 45.9 48.5 44.4  MCV 87.1 89.0  91.0  MCH 29.2 29.5 29.3  MCHC 33.6 33.2 32.2  RDW 14.3 14.3 14.6  PLT 221 208 PLATELET CLUMPS NOTED ON SMEAR, UNABLE TO ESTIMATE   Thyroid  Recent Labs  Lab 11/04/22 0655  TSH 3.422    BNP Recent Labs  Lab 10/30/22 2338 11/03/22 1832  BNP 532.5* 331.2*    DDimer No results for input(s): "DDIMER" in the last 168 hours.   Radiology/Studies:  ECHOCARDIOGRAM COMPLETE  Result Date: 11/04/2022    ECHOCARDIOGRAM REPORT   Patient Name:   Christian [name] Date of Exam: 11/04/2022 Medical Rec #:  [id number]       Height:       66.0 in Accession #:    [id number]      Weight:       178.6 lb Date of Birth:  29-Apr-1960       BSA:          1.906 m Patient Age:    61 years        BP:           136/111 mmHg Patient Gender: M               HR:           110 bpm. Exam Location:  Inpatient Procedure: 2D Echo, Cardiac Doppler and Color Doppler Indications:    Atrial fibrillation  History:        Patient has no prior history of Echocardiogram examinations.  Arrythmias:Atrial Fibrillation; Risk Factors:Hypertension and                 Dyslipidemia.  Sonographer:    Milda Smart Referring Phys: [id number] ALEXIS HUGELMEYER IMPRESSIONS  1. Left ventricular ejection fraction, by estimation, is 55 to 60%. The left ventricle has normal function. The left ventricle has no regional wall motion abnormalities. There is severe concentric left ventricular hypertrophy. Left ventricular diastolic  parameters are indeterminate.  2. Right ventricular systolic function is normal. The right ventricular size is normal.  3. The mitral valve is normal in structure. No evidence of mitral valve regurgitation. No evidence of mitral stenosis.  4. The aortic valve was not well visualized. Aortic valve regurgitation is not visualized. No aortic stenosis is present.  5. The inferior vena cava is normal in size with <50% respiratory variability, suggesting right atrial pressure of 8 mmHg. Comparison(s): No prior Echocardiogram.  FINDINGS  Left Ventricle: Left ventricular ejection fraction, by estimation, is 55 to 60%. The left ventricle has normal function. The left ventricle has no regional wall motion abnormalities. The left ventricular internal cavity size was normal in size. There is  severe concentric left ventricular hypertrophy. Left ventricular diastolic parameters are indeterminate. Right Ventricle: The right ventricular size is normal. No increase in right ventricular wall thickness. Right ventricular systolic function is normal. Left Atrium: Left atrial size was normal in size. Right Atrium: Right atrial size was normal in size. Pericardium: There is no evidence of pericardial effusion. Mitral Valve: The mitral valve is normal in structure. No evidence of mitral valve regurgitation. No evidence of mitral valve stenosis. Tricuspid Valve: The tricuspid valve is normal in structure. Tricuspid valve regurgitation is not demonstrated. No evidence of tricuspid stenosis. Aortic Valve: The aortic valve was not well visualized. Aortic valve regurgitation is not visualized. No aortic stenosis is present. Pulmonic Valve: The pulmonic valve was normal in structure. Pulmonic valve regurgitation is not visualized. No evidence of pulmonic stenosis. Aorta: The aortic root, ascending aorta and aortic arch are all structurally normal, with no evidence of dilitation or obstruction. Venous: The inferior vena cava is normal in size with less than 50% respiratory variability, suggesting right atrial pressure of 8 mmHg. IAS/Shunts: No atrial level shunt detected by color flow Doppler.  LEFT VENTRICLE PLAX 2D LVIDd:         3.95 cm     Diastology LVIDs:         3.20 cm     LV e' medial:  6.09 cm/s LV PW:         1.35 cm     LV e' lateral: 8.81 cm/s LV IVS:        1.30 cm  LV Volumes (MOD) LV vol d, MOD A2C: 59.9 ml LV vol d, MOD A4C: 69.8 ml LV vol s, MOD A2C: 25.1 ml LV vol s, MOD A4C: 24.6 ml LV SV MOD A2C:     34.8 ml LV SV MOD A4C:     69.8 ml LV SV  MOD BP:      38.7 ml RIGHT VENTRICLE            IVC RV Basal diam:  3.30 cm    IVC diam: 1.90 cm RV S prime:     8.92 cm/s TAPSE (M-mode): 1.6 cm LEFT ATRIUM             Index        RIGHT ATRIUM  Index LA diam:        4.00 cm 2.10 cm/m   RA Area:     16.30 cm LA Vol (A2C):   54.8 ml 28.73 ml/m  RA Volume:   37.50 ml  19.68 ml/m LA Vol (A4C):   37.3 ml 19.57 ml/m LA Biplane Vol: 50.4 ml 26.45 ml/m  AORTIC VALVE LVOT Vmax:   95.30 cm/s LVOT Vmean:  64.700 cm/s LVOT VTI:    0.181 m  SHUNTS Systemic VTI: 0.18 m Riley Lam MD Electronically signed by Riley Lam MD Signature Date/Time: 11/04/2022/12:40:48 PM    Final    DG Chest Port 1 View  Result Date: 11/03/2022 CLINICAL DATA:  Shortness of breath. EXAM: PORTABLE CHEST 1 VIEW COMPARISON:  10/30/2022. FINDINGS: Low lung volumes accentuate the pulmonary vasculature and cardiomediastinal silhouette. No consolidation or pulmonary edema. No pleural effusion or pneumothorax. IMPRESSION: Low lung volumes without evidence of acute cardiopulmonary disease. Electronically Signed   By: Orvan Falconer M.D.   On: 11/03/2022 20:07     Assessment and Plan:   A-fib with RVR, new since 10/30/2022 RBBB -Presented with heart palpitation, shortness of breath, fatigue started 10/30/2022 morning -EKG and telemetry consistent with A-fib RVR - TSH WNL, no significant infection or metabolic derangement, Hs trop negative x3  -Echo with LVEF 55 to 60%, no regional motion abnormality, severe concentric LVH, indeterminate diastolic parameter, normal RV, no significant valvular disease -Current ventricular rate is around 100bpm, on PTA Coreg 25 mg twice daily -He is quite symptomatic, unclear if rate control would be sufficient for symptom relief, will switch carvedilol to metoprolol 50mg  BID today, continue monitor rate  -CHA2DS2-VASc score is 1 for hypertension, anticoagulation had been started by ER provider on 10/30/22 with Eliquis 5mg  BID, OK -He  potentially may require TEE/DCCV this admission if symptoms remain significant, next available Thursday 8/15 this week; otherwise we can plan for 3-week uninterrupted anticoagulation and outpatient DCCV -Will arrange A-fib clinic follow-up appointment -Will refer to EP for ablation, message has been sent    Risk Assessment/Risk Scores:  { CHA2DS2-VASc Score = 1  This indicates a 0.6% annual risk of stroke. The patient's score is based upon: CHF History: 0 HTN History: 1 Diabetes History: 0 Stroke History: 0 Vascular Disease History: 0 Age Score: 0 Gender Score: 0         For questions or updates, please contact  [facility] Please consult [email or web address] for contact info under    Signed, Cyndi Bender, NP  11/04/2022 1:29 PM

## 2022-11-04 NOTE — Progress Notes (Signed)
  Progress Note   Patient: Christian [name] ZOX:[id number] DOB: [birth year] DOA: 11/03/2022     0 DOS: the patient was seen and examined on 11/04/2022   Brief hospital course: 62 y.o. male with a known history of chronic pain, hypertension, hyperlipidemia, osteoarthritis, spinal stenosis, recent diagnosis of atrial fibrillation started on Eliquis 3 days ago presents to the emergency department for evaluation of palpitations.  Patient was in a usual state of health until 3 days ago he was diagnosed with atrial fibrillation in the emergency department after presenting with palpitations.  He was placed on Eliquis and discharged home and continued his regular Coreg.  He returns with recurrent symptoms which are in fact worse including shortness of breath, chest pain, fatigue and palpitations.  Cardiology consulted  Assessment and Plan: #.  Atrial fibrillation with RVR - p.o. Cardizem was added at time of admission - Continued Coreg and Eliquis - this AM, HR remained in the mid-100's to 120's -Cardiology was consulted, appreciate input. Recs for TEE cardioversion -recheck bmet and CBC in aM   #.  Mild hypokalemia - replaced   #.  History of chronic pain - Continued baclofen, gabapentin, Dilaudid, tizanidine, Cymbalta   #. History of prediabetes - Continue SSI as needed -A1c pending -Continued Victoza at admission   #. History of GERD - Continue Protonix   #. History of hyperlipidemia - Continue lovastatin   #. History of hypertension - Continue losartan -BP stable at this time   Subjective: Reports feeling better this AM. Very eager about going home  Physical Exam: Vitals:   11/04/22 0530 11/04/22 0716 11/04/22 1018 11/04/22 1238  BP: (!) 141/74 (!) 155/79 (!) 136/111 (!) 134/91  Pulse: 84 67 97 70  Resp: (!) 21 (!) 22 20 18   Temp:  98.3 F (36.8 C) 98.4 F (36.9 C) 97.9 F (36.6 C)  TempSrc:  Oral Oral Oral  SpO2: 98% 98% 99% 99%  Weight:      Height:       General exam:  Awake, laying in bed, in nad Respiratory system: Normal respiratory effort, no wheezing Cardiovascular system: regular rate, s1, s2 Gastrointestinal system: Soft, nondistended, positive BS Central nervous system: CN2-12 grossly intact, strength intact Extremities: Perfused, no clubbing Skin: Normal skin turgor, no notable skin lesions seen Psychiatry: Mood normal // no visual hallucinations   Data Reviewed:  Labs reviewed: Na 138, K 3.8, Cr 0.57, WBC 3.8, Hgb 14.3   Family Communication: Pt in room, family at bedside  Disposition: Status is: Observation The patient will require care spanning > 2 midnights and should be moved to inpatient because: severity of illness  Planned Discharge Destination: Home     Author: Rickey Barbara, MD 11/04/2022 3:47 PM  For on call review [email or web address].

## 2022-11-05 DIAGNOSIS — I4819 Other persistent atrial fibrillation: Secondary | ICD-10-CM

## 2022-11-05 DIAGNOSIS — I4891 Unspecified atrial fibrillation: Secondary | ICD-10-CM | POA: Diagnosis not present

## 2022-11-05 DIAGNOSIS — I10 Essential (primary) hypertension: Secondary | ICD-10-CM | POA: Diagnosis not present

## 2022-11-05 LAB — GLUCOSE, CAPILLARY
Glucose-Capillary: 117 mg/dL — ABNORMAL HIGH (ref 70–99)
Glucose-Capillary: 151 mg/dL — ABNORMAL HIGH (ref 70–99)
Glucose-Capillary: 112 mg/dL — ABNORMAL HIGH (ref 70–99)
Glucose-Capillary: 105 mg/dL — ABNORMAL HIGH (ref 70–99)

## 2022-11-05 MED ORDER — GUAIFENESIN 100 MG/5ML PO LIQD: 5.0000 mL | ORAL | Status: DC | PRN

## 2022-11-05 MED ORDER — METOPROLOL TARTRATE 5 MG/5ML IV SOLN: 5.0000 mg | INTRAVENOUS | Status: DC | PRN

## 2022-11-05 MED ORDER — LEVALBUTEROL HCL 1.25 MG/0.5ML IN NEBU: 1.2500 mg | INHALATION_SOLUTION | Freq: Three times a day (TID) | RESPIRATORY_TRACT | Status: DC | PRN

## 2022-11-05 MED ORDER — HYDRALAZINE HCL 20 MG/ML IJ SOLN: 10.0000 mg | INTRAMUSCULAR | Status: DC | PRN

## 2022-11-05 MED ORDER — TRAZODONE HCL 50 MG PO TABS: 50.0000 mg | ORAL_TABLET | Freq: Every evening | ORAL | Status: DC | PRN

## 2022-11-05 NOTE — Progress Notes (Signed)
Rounding Note    Patient Name: Christian [name] Date of Encounter: 11/05/2022  [facility] Cardiologist: Armanda Magic, MD   Subjective   Patient reports feeling very well today. Only has palpitations when he tries to get out of bed or if he walks around. No palpitations at rest. Per telemetry, HR well controlled in the 90s-100s  Inpatient Medications    Scheduled Meds:  apixaban  5 mg Oral BID   diltiazem  120 mg Oral Daily   diltiazem  30 mg Oral Once   DULoxetine  30 mg Oral TID   gabapentin  800 mg Oral TID   insulin aspart  0-15 Units Subcutaneous TID WC   insulin aspart  0-5 Units Subcutaneous QHS   liraglutide  1.8 mg Subcutaneous Daily   losartan  100 mg Oral q AM   metoprolol tartrate  5 mg Intravenous Q6H   metoprolol tartrate  50 mg Oral BID   pantoprazole  40 mg Oral QPM   pravastatin  40 mg Oral q1800   sodium chloride flush  3 mL Intravenous Q12H   tiZANidine  4 mg Oral TID   Continuous Infusions:  PRN Meds: acetaminophen **OR** acetaminophen, bisacodyl, guaiFENesin, hydrALAZINE, HYDROcodone-acetaminophen, HYDROmorphone, ipratropium, levalbuterol, metoprolol tartrate, ondansetron **OR** ondansetron (ZOFRAN) IV, senna-docusate, traZODone, zolpidem   Vital Signs    Vitals:   11/04/22 1643 11/04/22 2102 11/05/22 0144 11/05/22 0806  BP: 128/69 132/83 (!) 120/59 (!) 128/99  Pulse: (!) 107 98 79 86  Resp:  15  16  Temp: 98.3 F (36.8 C) 98.1 F (36.7 C) 97.9 F (36.6 C) 98 F (36.7 C)  TempSrc: Oral Oral Oral Oral  SpO2: 98% 99% 100% 98%  Weight:      Height:       No intake or output data in the 24 hours ending 11/05/22 1006    11/03/2022    6:09 PM 10/30/2022    9:52 PM 08/12/2021    9:29 AM  Last 3 Weights  Weight (lbs) 178 lb 9.2 oz 180 lb 181 lb  Weight (kg) 81 kg 81.647 kg 82.101 kg      Telemetry    Atrial fibrillation, HR in the 90s-100s - Personally Reviewed  ECG    No new tracings - Personally Reviewed  Physical Exam    GEN: No acute distress. Sitting comfortably in the bed  Neck: No JVD Cardiac: Irregular rate and rhythm, no murmurs, rubs, or gallops.  Respiratory: Clear to auscultation bilaterally. Normal work of breathing on room air  GI: Soft, nontender, non-distended  MS: No edema in BLE; No deformity. Neuro:  Nonfocal  Psych: Normal affect   Labs    High Sensitivity Troponin:   Recent Labs  Lab 10/30/22 2338 11/03/22 1832 11/03/22 2228 11/04/22 0602 11/04/22 1311  TROPONINIHS 9 3 3 3 4      Chemistry Recent Labs  Lab 11/03/22 1832 11/04/22 0602 11/05/22 0500  NA 136 136 137  K 3.4* 3.8 3.8  CL 106 105 105  CO2 23 22 24   GLUCOSE 161* 108* 110*  BUN 17 15 18   CREATININE 0.74 0.57* 0.52*  CALCIUM 8.5* 8.3* 8.6*  MG 2.1 2.2 2.1  PROT 6.9 6.1* 6.2*  ALBUMIN 3.6 3.2* 3.3*  AST 16 12* 13*  ALT 14 13 14   ALKPHOS 65 59 60  BILITOT 0.6 0.4 0.7  GFRNONAA >60 >60 >60  ANIONGAP 7 9 8     Lipids  Recent Labs  Lab 11/04/22 0602  CHOL  146  TRIG 111  HDL 44  LDLCALC 80  CHOLHDL 3.3    Hematology Recent Labs  Lab 11/03/22 1832 11/04/22 0602 11/05/22 0500  WBC 4.8 3.8* 4.0  RBC 5.45 4.88 5.03  HGB 16.1 14.3 14.9  HCT 48.5 44.4 44.3  MCV 89.0 91.0 88.1  MCH 29.5 29.3 29.6  MCHC 33.2 32.2 33.6  RDW 14.3 14.6 14.4  PLT 208 PLATELET CLUMPS NOTED ON SMEAR, UNABLE TO ESTIMATE 217   Thyroid  Recent Labs  Lab 11/04/22 0655  TSH 3.422    BNP Recent Labs  Lab 10/30/22 2338 11/03/22 1832  BNP 532.5* 331.2*    DDimer No results for input(s): "DDIMER" in the last 168 hours.   Radiology    ECHOCARDIOGRAM COMPLETE  Result Date: 11/04/2022    ECHOCARDIOGRAM REPORT   Patient Name:   Christian [name] Date of Exam: 11/04/2022 Medical Rec #:  [id number]       Height:       66.0 in Accession #:    [id number]      Weight:       178.6 lb Date of Birth:  1960-03-30       BSA:          1.906 m Patient Age:    61 years        BP:           136/111 mmHg Patient Gender: M                HR:           110 bpm. Exam Location:  Inpatient Procedure: 2D Echo, Cardiac Doppler and Color Doppler Indications:    Atrial fibrillation  History:        Patient has no prior history of Echocardiogram examinations.                 Arrythmias:Atrial Fibrillation; Risk Factors:Hypertension and                 Dyslipidemia.  Sonographer:    Milda Smart Referring Phys: [id number] ALEXIS HUGELMEYER IMPRESSIONS  1. Left ventricular ejection fraction, by estimation, is 55 to 60%. The left ventricle has normal function. The left ventricle has no regional wall motion abnormalities. There is severe concentric left ventricular hypertrophy. Left ventricular diastolic  parameters are indeterminate.  2. Right ventricular systolic function is normal. The right ventricular size is normal.  3. The mitral valve is normal in structure. No evidence of mitral valve regurgitation. No evidence of mitral stenosis.  4. The aortic valve was not well visualized. Aortic valve regurgitation is not visualized. No aortic stenosis is present.  5. The inferior vena cava is normal in size with <50% respiratory variability, suggesting right atrial pressure of 8 mmHg. Comparison(s): No prior Echocardiogram. FINDINGS  Left Ventricle: Left ventricular ejection fraction, by estimation, is 55 to 60%. The left ventricle has normal function. The left ventricle has no regional wall motion abnormalities. The left ventricular internal cavity size was normal in size. There is  severe concentric left ventricular hypertrophy. Left ventricular diastolic parameters are indeterminate. Right Ventricle: The right ventricular size is normal. No increase in right ventricular wall thickness. Right ventricular systolic function is normal. Left Atrium: Left atrial size was normal in size. Right Atrium: Right atrial size was normal in size. Pericardium: There is no evidence of pericardial effusion. Mitral Valve: The mitral valve is normal in structure. No evidence of  mitral valve regurgitation. No evidence of mitral valve  stenosis. Tricuspid Valve: The tricuspid valve is normal in structure. Tricuspid valve regurgitation is not demonstrated. No evidence of tricuspid stenosis. Aortic Valve: The aortic valve was not well visualized. Aortic valve regurgitation is not visualized. No aortic stenosis is present. Pulmonic Valve: The pulmonic valve was normal in structure. Pulmonic valve regurgitation is not visualized. No evidence of pulmonic stenosis. Aorta: The aortic root, ascending aorta and aortic arch are all structurally normal, with no evidence of dilitation or obstruction. Venous: The inferior vena cava is normal in size with less than 50% respiratory variability, suggesting right atrial pressure of 8 mmHg. IAS/Shunts: No atrial level shunt detected by color flow Doppler.  LEFT VENTRICLE PLAX 2D LVIDd:         3.95 cm     Diastology LVIDs:         3.20 cm     LV e' medial:  6.09 cm/s LV PW:         1.35 cm     LV e' lateral: 8.81 cm/s LV IVS:        1.30 cm  LV Volumes (MOD) LV vol d, MOD A2C: 59.9 ml LV vol d, MOD A4C: 69.8 ml LV vol s, MOD A2C: 25.1 ml LV vol s, MOD A4C: 24.6 ml LV SV MOD A2C:     34.8 ml LV SV MOD A4C:     69.8 ml LV SV MOD BP:      38.7 ml RIGHT VENTRICLE            IVC RV Basal diam:  3.30 cm    IVC diam: 1.90 cm RV S prime:     8.92 cm/s TAPSE (M-mode): 1.6 cm LEFT ATRIUM             Index        RIGHT ATRIUM           Index LA diam:        4.00 cm 2.10 cm/m   RA Area:     16.30 cm LA Vol (A2C):   54.8 ml 28.73 ml/m  RA Volume:   37.50 ml  19.68 ml/m LA Vol (A4C):   37.3 ml 19.57 ml/m LA Biplane Vol: 50.4 ml 26.45 ml/m  AORTIC VALVE LVOT Vmax:   95.30 cm/s LVOT Vmean:  64.700 cm/s LVOT VTI:    0.181 m  SHUNTS Systemic VTI: 0.18 m Riley Lam MD Electronically signed by Riley Lam MD Signature Date/Time: 11/04/2022/12:40:48 PM    Final    DG Chest Port 1 View  Result Date: 11/03/2022 CLINICAL DATA:  Shortness of breath. EXAM:  PORTABLE CHEST 1 VIEW COMPARISON:  10/30/2022. FINDINGS: Low lung volumes accentuate the pulmonary vasculature and cardiomediastinal silhouette. No consolidation or pulmonary edema. No pleural effusion or pneumothorax. IMPRESSION: Low lung volumes without evidence of acute cardiopulmonary disease. Electronically Signed   By: Orvan Falconer M.D.   On: 11/03/2022 20:07    Cardiac Studies   Echocardiogram 11/04/22  1. Left ventricular ejection fraction, by estimation, is 55 to 60%. The  left ventricle has normal function. The left ventricle has no regional  wall motion abnormalities. There is severe concentric left ventricular  hypertrophy. Left ventricular diastolic   parameters are indeterminate.   2. Right ventricular systolic function is normal. The right ventricular  size is normal.   3. The mitral valve is normal in structure. No evidence of mitral valve  regurgitation. No evidence of mitral stenosis.   4. The aortic valve was not well  visualized. Aortic valve regurgitation  is not visualized. No aortic stenosis is present.   5. The inferior vena cava is normal in size with <50% respiratory  variability, suggesting right atrial pressure of 8 mmHg.   Patient Profile     62 y.o. male with a past medical history of HTN, HLD, chronic back pain, osteoarthritis. Patient is currently admitted with afib with RVR and is pending TEE guided DCCV on 8/16  Assessment & Plan    Afib with RVR- new since 10/30/22 RBBB - Patient presented with palpitations, SOB, fatigue. Symptoms started on 10/30/22 AM. Found to be in Afib with RVR - TSH within normal limits - hsTn negative x3  - Echocardiogram this admission with EF 55-60%, no regional wall motion abnormalities, severe LVH, normal RV function  - Now on metoprolol tartrate 50 mg BID, cardizem CD 120 mg daily  - Per telemetry, HR is now in the 90s-100s. Does have palpitations on exertion. Overall does feel much better  - CHADS- VASc 1 (HTN)- on eliquis  5 mg BID  - Scheduled for TEE guided DCCV on 8/16 - needs to be NPO midnight 8/16  For questions or updates, please contact [facility] Please consult [email or web address] for contact info under        Signed, Jonita Albee, PA-C  11/05/2022, 10:06 AM

## 2022-11-05 NOTE — TOC Initial Note (Signed)
Transition of Care [facility]) - Initial/Assessment Note    Patient Details  Name: Christian [name] MRN: [id number] Date of Birth: 1960/12/23  Transition of Care [facility]) CM/SW Contact:    Lanier Clam, RN Phone Number: 11/05/2022, 12:53 PM  Clinical Narrative:  d/c plan home. Monitor for d/c plans.                 Expected Discharge Plan: Home/Self Care Barriers to Discharge: Continued Medical Work up   Patient Goals and CMS Choice Patient states their goals for this hospitalization and ongoing recovery are:: Home CMS Medicare.gov Compare Post Acute Care list provided to:: Patient Choice offered to / list presented to : Patient Industry ownership interest in [facility].provided to:: Patient    Expected Discharge Plan and Services   Discharge Planning Services: CM Consult   Living arrangements for the past 2 months: Single Family Home                                      Prior Living Arrangements/Services Living arrangements for the past 2 months: Single Family Home Lives with:: Adult Children Patient language and need for interpreter reviewed:: Yes Do you feel safe going back to the place where you live?: Yes      Need for Family Participation in Patient Care: Yes (Comment) Care giver support system in place?: Yes (comment)   Criminal Activity/Legal Involvement Pertinent to Current Situation/Hospitalization: No - Comment as needed  Activities of Daily Living Home Assistive Devices/Equipment: None ADL Screening (condition at time of admission) Patient's cognitive ability adequate to safely complete daily activities?: Yes Is the patient deaf or have difficulty hearing?: No Does the patient have difficulty seeing, even when wearing glasses/contacts?: No Does the patient have difficulty concentrating, remembering, or making decisions?: No Patient able to express need for assistance with ADLs?: No Does the patient have difficulty dressing or bathing?:  No Independently performs ADLs?: Yes (appropriate for developmental age) Does the patient have difficulty walking or climbing stairs?: No Weakness of Legs: None Weakness of Arms/Hands: None  Permission Sought/Granted Permission sought to share information with : Case Manager Permission granted to share information with : Yes, Verbal Permission Granted  Share Information with NAME: Case Manager           Emotional Assessment Appearance:: Appears stated age Attitude/Demeanor/Rapport: Gracious Affect (typically observed): Accepting   Alcohol / Substance Use: Not Applicable Psych Involvement: No (comment)  Admission diagnosis:  Atrial fibrillation with RVR (HCC) [I48.91] Atrial fibrillation, unspecified type (HCC) [I48.91] Atrial flutter with rapid ventricular response (HCC) [I48.92] Patient Active Problem List   Diagnosis Date Noted   Atrial fibrillation (HCC) 11/04/2022   Atrial flutter with rapid ventricular response (HCC) 11/04/2022   Primary osteoarthritis of left knee 08/12/2021   OA (osteoarthritis) of knee 01/28/2021   Primary osteoarthritis of right knee 01/28/2021   Spinal stenosis in cervical region 11/03/2012   Cervical disc disorder with radiculopathy of cervical region 11/03/2012   Essential hypertension, benign 11/07/2011   History of kidney stones 11/07/2011   PCP:  Laqueta Due., MD Pharmacy:   [facility] [phone] [place], [place] - [address] AT [facility] OF [address] [place] [address] Phone: [phone] Fax: [phone]     Social Determinants of Health (SDOH) Social History: SDOH Screenings   Food Insecurity: No Food Insecurity (11/04/2022)  Housing: Low Risk  (11/04/2022)  Transportation Needs: No Transportation Needs (11/04/2022)  Utilities: Not At Risk (11/04/2022)  Tobacco Use: High Risk (11/04/2022)   SDOH Interventions:     Readmission Risk Interventions     No data to display

## 2022-11-05 NOTE — Progress Notes (Signed)
Mobility Specialist - Progress Note   11/05/22 1052  Mobility  Activity Ambulated independently in hallway  Level of Assistance Independent  Assistive Device None  Distance Ambulated (ft) 480 ft  Activity Response Tolerated well  Mobility Referral Yes  $Mobility charge 1 Mobility  Mobility Specialist Start Time (ACUTE ONLY) 1019  Mobility Specialist Stop Time (ACUTE ONLY) 1030  Mobility Specialist Time Calculation (min) (ACUTE ONLY) 11 min   Pt received in bed and agreeable to mobility. No complaints during session. Pt to bed after session with all needs met.    During mobility: 120 HR Post-mobility: 95 HR  [facility] Paediatric nurse

## 2022-11-05 NOTE — Progress Notes (Signed)
PROGRESS NOTE    [name]  ZOX:[id number] DOB: 03/23/1961 DOA: 11/03/2022 PCP: Laqueta Due., MD   Brief Narrative:  62 year old with history of chronic pain, HTN, HLD, osteoarthritis, spinal stenosis with recent diagnosis of A-fib started on Eliquis 3 days prior to admission comes to the hospital for palpitations.  Patient was recently seen in ED and diagnosed with atrial fibrillation and discharged home on Eliquis.  Patient seen by cardiology, changed to Lopressor.  Planning on TEE cardioversion.  This is scheduled for 8/15.   Assessment & Plan:  Principal Problem:   Atrial fibrillation (HCC) Active Problems:   Atrial flutter with rapid ventricular response (HCC)     Atrial fibrillation with RVR Intermittent A-fib with RVR.  Currently on Cardizem and metoprolol.  For now patient was Eliquis, seen by cardiology who is planning for TEE cardioversion on 8/15.  TSH is normal.  Echocardiogram shows EF of 60%.   Mild hypokalemia Replete as needed   History of chronic pain - Continued baclofen, gabapentin, Dilaudid, tizanidine, Cymbalta   History of prediabetes On Victoza at admission.  A1c pending   History of GERD - Continue Protonix   History of hyperlipidemia - Continue lovastatin   History of hypertension Currently on Cardizem, losartan, metoprolol.  IV as needed   DVT prophylaxis:  apixaban (ELIQUIS) tablet 5 mg   Code Status: Full code Family Communication:   Status is: Inpatient Remains inpatient appropriate because: Pending cardiology and patient decision regarding inptn vs outpatient Cardioversion.        Diet Orders (From admission, onward)     Start     Ordered   11/04/22 0430  Diet heart healthy/carb modified Room service appropriate? Yes; Fluid consistency: Thin  Diet effective now       Question Answer Comment  Diet-HS Snack? Nothing   Room service appropriate? Yes   Fluid consistency: Thin      11/04/22 0429             Subjective: Tachycardic with ambulation No complaints at rest.    Examination:  General exam: Appears calm and comfortable  Respiratory system: Clear to auscultation. Respiratory effort normal. Cardiovascular system: S1 & S2 heard, RRR. No JVD, murmurs, rubs, gallops or clicks. No pedal edema. Gastrointestinal system: Abdomen is nondistended, soft and nontender. No organomegaly or masses felt. Normal bowel sounds heard. Central nervous system: Alert and oriented. No focal neurological deficits. Extremities: Symmetric 5 x 5 power. Skin: No rashes, lesions or ulcers Psychiatry: Judgement and insight appear normal. Mood & affect appropriate.  Objective: Vitals:   11/04/22 1643 11/04/22 2102 11/05/22 0144 11/05/22 0806  BP: 128/69 132/83 (!) 120/59 (!) 128/99  Pulse: (!) 107 98 79 86  Resp:  15  16  Temp: 98.3 F (36.8 C) 98.1 F (36.7 C) 97.9 F (36.6 C) 98 F (36.7 C)  TempSrc: Oral Oral Oral Oral  SpO2: 98% 99% 100% 98%  Weight:      Height:       No intake or output data in the 24 hours ending 11/05/22 0841 Filed Weights   11/03/22 1809  Weight: 81 kg    Scheduled Meds:  apixaban  5 mg Oral BID   diltiazem  120 mg Oral Daily   diltiazem  30 mg Oral Once   DULoxetine  30 mg Oral TID   gabapentin  800 mg Oral TID   insulin aspart  0-15 Units Subcutaneous TID WC   insulin aspart  0-5 Units Subcutaneous QHS  liraglutide  1.8 mg Subcutaneous Daily   losartan  100 mg Oral q AM   metoprolol tartrate  5 mg Intravenous Q6H   metoprolol tartrate  50 mg Oral BID   pantoprazole  40 mg Oral QPM   pravastatin  40 mg Oral q1800   sodium chloride flush  3 mL Intravenous Q12H   tiZANidine  4 mg Oral TID   Continuous Infusions:  Nutritional status     Body mass index is 28.82 kg/m.  Data Reviewed:   CBC: Recent Labs  Lab 10/30/22 2202 11/03/22 1832 11/04/22 0602 11/05/22 0500  WBC 5.1 4.8 3.8* 4.0  NEUTROABS 1.7  --   --   --   HGB 15.4 16.1 14.3 14.9   HCT 45.9 48.5 44.4 44.3  MCV 87.1 89.0 91.0 88.1  PLT 221 208 PLATELET CLUMPS NOTED ON SMEAR, UNABLE TO ESTIMATE 217   Basic Metabolic Panel: Recent Labs  Lab 10/30/22 2202 11/03/22 1832 11/04/22 0602 11/05/22 0500  NA 139 136 136 137  K 3.8 3.4* 3.8 3.8  CL 108 106 105 105  CO2 23 23 22 24   GLUCOSE 143* 161* 108* 110*  BUN 26* 17 15 18   CREATININE 0.58* 0.74 0.57* 0.52*  CALCIUM 8.8* 8.5* 8.3* 8.6*  MG  --  2.1 2.2 2.1  PHOS  --   --  3.6  --    GFR: Estimated Creatinine Clearance: 95.7 mL/min (A) (by C-G formula based on SCr of 0.52 mg/dL (L)). Liver Function Tests: Recent Labs  Lab 11/03/22 1832 11/04/22 0602 11/05/22 0500  AST 16 12* 13*  ALT 14 13 14   ALKPHOS 65 59 60  BILITOT 0.6 0.4 0.7  PROT 6.9 6.1* 6.2*  ALBUMIN 3.6 3.2* 3.3*   No results for input(s): "LIPASE", "AMYLASE" in the last 168 hours. No results for input(s): "AMMONIA" in the last 168 hours. Coagulation Profile: No results for input(s): "INR", "PROTIME" in the last 168 hours. Cardiac Enzymes: No results for input(s): "CKTOTAL", "CKMB", "CKMBINDEX", "TROPONINI" in the last 168 hours. BNP (last 3 results) No results for input(s): "PROBNP" in the last 8760 hours. HbA1C: No results for input(s): "HGBA1C" in the last 72 hours. CBG: Recent Labs  Lab 11/04/22 1641 11/04/22 2104 11/05/22 0753  GLUCAP 115* 120* 117*   Lipid Profile: Recent Labs    11/04/22 0602  CHOL 146  HDL 44  LDLCALC 80  TRIG 111  CHOLHDL 3.3   Thyroid Function Tests: Recent Labs    11/04/22 0655  TSH 3.422   Anemia Panel: No results for input(s): "VITAMINB12", "FOLATE", "FERRITIN", "TIBC", "IRON", "RETICCTPCT" in the last 72 hours. Sepsis Labs: No results for input(s): "PROCALCITON", "LATICACIDVEN" in the last 168 hours.  No results found for this or any previous visit (from the past 240 hour(s)).       Radiology Studies: ECHOCARDIOGRAM COMPLETE  Result Date: 11/04/2022    ECHOCARDIOGRAM REPORT    Patient Name:   Christian [name] Date of Exam: 11/04/2022 Medical Rec #:  [id number]       Height:       66.0 in Accession #:    [id number]      Weight:       178.6 lb Date of Birth:  April 04, 1960       BSA:          1.906 m Patient Age:    61 years        BP:  136/111 mmHg Patient Gender: M               HR:           110 bpm. Exam Location:  Inpatient Procedure: 2D Echo, Cardiac Doppler and Color Doppler Indications:    Atrial fibrillation  History:        Patient has no prior history of Echocardiogram examinations.                 Arrythmias:Atrial Fibrillation; Risk Factors:Hypertension and                 Dyslipidemia.  Sonographer:    Milda Smart Referring Phys: [id number] ALEXIS HUGELMEYER IMPRESSIONS  1. Left ventricular ejection fraction, by estimation, is 55 to 60%. The left ventricle has normal function. The left ventricle has no regional wall motion abnormalities. There is severe concentric left ventricular hypertrophy. Left ventricular diastolic  parameters are indeterminate.  2. Right ventricular systolic function is normal. The right ventricular size is normal.  3. The mitral valve is normal in structure. No evidence of mitral valve regurgitation. No evidence of mitral stenosis.  4. The aortic valve was not well visualized. Aortic valve regurgitation is not visualized. No aortic stenosis is present.  5. The inferior vena cava is normal in size with <50% respiratory variability, suggesting right atrial pressure of 8 mmHg. Comparison(s): No prior Echocardiogram. FINDINGS  Left Ventricle: Left ventricular ejection fraction, by estimation, is 55 to 60%. The left ventricle has normal function. The left ventricle has no regional wall motion abnormalities. The left ventricular internal cavity size was normal in size. There is  severe concentric left ventricular hypertrophy. Left ventricular diastolic parameters are indeterminate. Right Ventricle: The right ventricular size is normal. No increase in  right ventricular wall thickness. Right ventricular systolic function is normal. Left Atrium: Left atrial size was normal in size. Right Atrium: Right atrial size was normal in size. Pericardium: There is no evidence of pericardial effusion. Mitral Valve: The mitral valve is normal in structure. No evidence of mitral valve regurgitation. No evidence of mitral valve stenosis. Tricuspid Valve: The tricuspid valve is normal in structure. Tricuspid valve regurgitation is not demonstrated. No evidence of tricuspid stenosis. Aortic Valve: The aortic valve was not well visualized. Aortic valve regurgitation is not visualized. No aortic stenosis is present. Pulmonic Valve: The pulmonic valve was normal in structure. Pulmonic valve regurgitation is not visualized. No evidence of pulmonic stenosis. Aorta: The aortic root, ascending aorta and aortic arch are all structurally normal, with no evidence of dilitation or obstruction. Venous: The inferior vena cava is normal in size with less than 50% respiratory variability, suggesting right atrial pressure of 8 mmHg. IAS/Shunts: No atrial level shunt detected by color flow Doppler.  LEFT VENTRICLE PLAX 2D LVIDd:         3.95 cm     Diastology LVIDs:         3.20 cm     LV e' medial:  6.09 cm/s LV PW:         1.35 cm     LV e' lateral: 8.81 cm/s LV IVS:        1.30 cm  LV Volumes (MOD) LV vol d, MOD A2C: 59.9 ml LV vol d, MOD A4C: 69.8 ml LV vol s, MOD A2C: 25.1 ml LV vol s, MOD A4C: 24.6 ml LV SV MOD A2C:     34.8 ml LV SV MOD A4C:     69.8 ml LV SV  MOD BP:      38.7 ml RIGHT VENTRICLE            IVC RV Basal diam:  3.30 cm    IVC diam: 1.90 cm RV S prime:     8.92 cm/s TAPSE (M-mode): 1.6 cm LEFT ATRIUM             Index        RIGHT ATRIUM           Index LA diam:        4.00 cm 2.10 cm/m   RA Area:     16.30 cm LA Vol (A2C):   54.8 ml 28.73 ml/m  RA Volume:   37.50 ml  19.68 ml/m LA Vol (A4C):   37.3 ml 19.57 ml/m LA Biplane Vol: 50.4 ml 26.45 ml/m  AORTIC VALVE LVOT  Vmax:   95.30 cm/s LVOT Vmean:  64.700 cm/s LVOT VTI:    0.181 m  SHUNTS Systemic VTI: 0.18 m Riley Lam MD Electronically signed by Riley Lam MD Signature Date/Time: 11/04/2022/12:40:48 PM    Final    DG Chest Port 1 View  Result Date: 11/03/2022 CLINICAL DATA:  Shortness of breath. EXAM: PORTABLE CHEST 1 VIEW COMPARISON:  10/30/2022. FINDINGS: Low lung volumes accentuate the pulmonary vasculature and cardiomediastinal silhouette. No consolidation or pulmonary edema. No pleural effusion or pneumothorax. IMPRESSION: Low lung volumes without evidence of acute cardiopulmonary disease. Electronically Signed   By: Orvan Falconer M.D.   On: 11/03/2022 20:07           LOS: 1 day   Time spent= 35 mins     Joline Maxcy, MD [facility]  If 7PM-7AM, please contact night-coverage  11/05/2022, 8:41 AM

## 2022-11-06 ENCOUNTER — Other Ambulatory Visit: Payer: Self-pay | Admitting: Cardiology

## 2022-11-06 DIAGNOSIS — I517 Cardiomegaly: Secondary | ICD-10-CM | POA: Diagnosis not present

## 2022-11-06 DIAGNOSIS — I10 Essential (primary) hypertension: Secondary | ICD-10-CM | POA: Diagnosis not present

## 2022-11-06 DIAGNOSIS — I4891 Unspecified atrial fibrillation: Secondary | ICD-10-CM | POA: Diagnosis not present

## 2022-11-06 LAB — MAGNESIUM: Magnesium: 2.4 mg/dL (ref 1.7–2.4)

## 2022-11-06 LAB — CBC
HCT: 43.7 % (ref 39.0–52.0)
Hemoglobin: 14.5 g/dL (ref 13.0–17.0)
MCH: 29.8 pg (ref 26.0–34.0)
MCHC: 33.2 g/dL (ref 30.0–36.0)
MCV: 89.7 fL (ref 80.0–100.0)
Platelets: 193 10*3/uL (ref 150–400)
RBC: 4.87 MIL/uL (ref 4.22–5.81)
RDW: 14.2 % (ref 11.5–15.5)
WBC: 4.4 10*3/uL (ref 4.0–10.5)
nRBC: 0 % (ref 0.0–0.2)

## 2022-11-06 LAB — BASIC METABOLIC PANEL
Anion gap: 9 (ref 5–15)
BUN: 20 mg/dL (ref 8–23)
CO2: 21 mmol/L — ABNORMAL LOW (ref 22–32)
Calcium: 8.2 mg/dL — ABNORMAL LOW (ref 8.9–10.3)
Chloride: 104 mmol/L (ref 98–111)
Creatinine, Ser: 0.75 mg/dL (ref 0.61–1.24)
GFR, Estimated: >60 mL/min (ref >60)
Glucose, Bld: 126 mg/dL — ABNORMAL HIGH (ref 70–99)
Potassium: 3.6 mmol/L (ref 3.5–5.1)
Sodium: 134 mmol/L — ABNORMAL LOW (ref 135–145)

## 2022-11-06 LAB — GLUCOSE, CAPILLARY
Glucose-Capillary: 114 mg/dL — ABNORMAL HIGH (ref 70–99)
Glucose-Capillary: 142 mg/dL — ABNORMAL HIGH (ref 70–99)
Glucose-Capillary: 89 mg/dL (ref 70–99)
Glucose-Capillary: 134 mg/dL — ABNORMAL HIGH (ref 70–99)

## 2022-11-06 MED ORDER — POTASSIUM CHLORIDE CRYS ER 20 MEQ PO TBCR
40.0000 meq | EXTENDED_RELEASE_TABLET | Freq: Once | ORAL | Status: AC
  Administered 2022-11-06: 40 meq via ORAL
  Filled 2022-11-06: qty 2

## 2022-11-06 NOTE — Progress Notes (Signed)
Ordered cardiac MRI with gad to rule out possible HOCM - per Dr. Zacarias Pontes, PA-C 11/06/2022 11:09 AM

## 2022-11-06 NOTE — Progress Notes (Signed)
Christian NOTE    PHU [place]  QIO:[id number] DOB: 10-Dec-1960 DOA: 11/03/2022 PCP: Laqueta Due., Christian    62 year old with history of chronic [name], Christian [name], Christian [name], Christian [name], Christian [name] diagnosed with atrial fibrillation [name] discharged home on Eliquis.  Patient seen by cardiology, changed to Lopressor.  Planning on TEE cardioversion.  This is scheduled for 8/16.     Assessment & Plan:  Principal Problem:   Atrial fibrillation (HCC) Active Problems:   Atrial flutter with rapid ventricular response (HCC)      Atrial fibrillation with RVR Intermittent A-fib with RVR.  Currently on Cardizem [name] metoprolol.  For now patient was Eliquis, seen by cardiology who is planning for TEE cardioversion on 8/16.  TSH is normal.  Echocardiogram shows EF of 60%.   Mild hypokalemia Replete [name] monitor electrolytes   History of chronic [name] - Continued baclofen, gabapentin, Dilaudid, tizanidine, Cymbalta   History of prediabetes On Victoza at admission.  A1c 5.8   History of GERD - Continue Protonix   History of hyperlipidemia - Continue lovastatin   History of hypertension Currently on Cardizem, losartan, metoprolol.  IV as needed     DVT prophylaxis: apixaban (ELIQUIS) tablet 5 mg   Code Status: Full code Family Communication:   Status is: Inpatient Cardiology planning on inpatient cardioversion on 8/16             Diet Orders (From admission, onward)     Start     Ordered   11/04/22 0430  Diet heart healthy/carb modified Room service appropriate? Yes; Fluid consistency: Thin  Diet effective now       Question Answer Comment  Diet-HS Snack? Nothing   Room service appropriate? Yes   Fluid consistency: Thin      11/04/22 0429            Subjective: Sitting up in the bed, denying any palpitations or chest [name].   No other complaints at the moment.   Examination:  General exam: Appears calm [name] comfortable  Respiratory system: Clear to auscultation. Respiratory effort normal. Cardiovascular system: Irregular irregular rhythm. No JVD, murmurs, rubs, gallops or clicks. No pedal edema. Gastrointestinal system: Abdomen is nondistended, soft [name] nontender. No organomegaly or masses felt. Normal bowel sounds heard. Central nervous system: Alert [name] oriented. No focal neurological deficits. Extremities: Symmetric 5 x 5 power. Skin: No rashes, lesions or ulcers Psychiatry: Judgement [name] insight appear normal. Mood & affect appropriate.  Objective: Vitals:   11/05/22 1743 11/05/22 1950 11/06/22 0047 11/06/22 0426  BP: (!) 136/97 133/76 121/80 119/80  Pulse: 87 83 89 89  Resp: 18 19  19   Temp: 97.8 F (36.6 C) 98.8 F (37.1 C)  98.4 F (36.9 C)  TempSrc: Axillary Oral  Oral  SpO2: 100% 99%  97%  Weight:      Height:        Intake/Output Summary (Last 24 hours) at 11/06/2022 1014 Last data filed at 11/05/2022 2200 Gross per 24 hour  Intake 1020 ml  Output --  Net 1020 ml   Filed Weights   11/03/22 1809  Weight: 81 kg    Scheduled Meds:  apixaban  5 mg Oral BID   diltiazem  120 mg Oral Daily   diltiazem  30 mg Oral Once   DULoxetine  30 mg Oral TID   gabapentin  800 mg Oral TID   insulin aspart  0-15 Units Subcutaneous TID WC   insulin aspart  0-5 Units Subcutaneous QHS   losartan  100 mg Oral q AM   metoprolol tartrate  50 mg Oral BID   pantoprazole  40 mg Oral QPM   pravastatin  40 mg Oral q1800   sodium chloride flush  3 mL Intravenous Q12H   tiZANidine  4 mg Oral TID   Continuous Infusions:  Nutritional status     Body mass index is 28.82 kg/m.  Data Reviewed:   CBC: Recent Labs  Lab 10/30/22 2202 11/03/22 1832 11/04/22 0602 11/05/22 0500 11/06/22 0447  WBC 5.1 4.8 3.8* 4.0 4.4  NEUTROABS 1.7  --   --   --   --   HGB 15.4 16.1 14.3 14.9 14.5  HCT 45.9 48.5  44.4 44.3 43.7  MCV 87.1 89.0 91.0 88.1 89.7  PLT 221 208 PLATELET CLUMPS NOTED ON SMEAR, UNABLE TO ESTIMATE 217 193   Basic Metabolic Panel: Recent Labs  Lab 10/30/22 2202 11/03/22 1832 11/04/22 0602 11/05/22 0500 11/06/22 0447  NA 139 136 136 137 134*  K 3.8 3.4* 3.8 3.8 3.6  CL 108 106 105 105 104  CO2 23 23 22 24  21*  GLUCOSE 143* 161* 108* 110* 126*  BUN 26* 17 15 18 20   CREATININE 0.58* 0.74 0.57* 0.52* 0.75  CALCIUM 8.8* 8.5* 8.3* 8.6* 8.2*  MG  --  2.1 2.2 2.1 2.4  PHOS  --   --  3.6  --   --    GFR: Estimated Creatinine Clearance: 95.7 mL/min (by C-G formula based on SCr of 0.75 mg/dL). Liver Function Tests: Recent Labs  Lab 11/03/22 1832 11/04/22 0602 11/05/22 0500  AST 16 12* 13*  ALT 14 13 14   ALKPHOS 65 59 60  BILITOT 0.6 0.4 0.7  PROT 6.9 6.1* 6.2*  ALBUMIN 3.6 3.2* 3.3*   No results for input(s): "LIPASE", "AMYLASE" in the last 168 hours. No results for input(s): "AMMONIA" in the last 168 hours. Coagulation Profile: No results for input(s): "INR", "PROTIME" in the last 168 hours. Cardiac Enzymes: No results for input(s): "CKTOTAL", "CKMB", "CKMBINDEX", "TROPONINI" in the last 168 hours. BNP (last 3 results) No results for input(s): "PROBNP" in the last 8760 hours. HbA1C: Recent Labs    11/04/22 1311  HGBA1C 5.8*   CBG: Recent Labs  Lab 11/05/22 0753 11/05/22 1156 11/05/22 1730 11/05/22 2215 11/06/22 0721  GLUCAP 117* 151* 112* 105* 114*   Lipid Profile: Recent Labs    11/04/22 0602  CHOL 146  HDL 44  LDLCALC 80  TRIG 111  CHOLHDL 3.3   Thyroid Function Tests: Recent Labs    11/04/22 0655  TSH 3.422   Anemia Panel: No results for input(s): "VITAMINB12", "FOLATE", "FERRITIN", "TIBC", "IRON", "RETICCTPCT" in the last 72 hours. Sepsis Labs: No results for input(s): "PROCALCITON", "LATICACIDVEN" in the last 168 hours.  No results found for this or any previous visit (from the past 240 hour(s)).       Radiology  Studies: ECHOCARDIOGRAM COMPLETE  Result Date: 11/04/2022    ECHOCARDIOGRAM REPORT   Patient Name:   Christian [name] Date of Exam: 11/04/2022 Medical Rec #:  [id number]       Height:       66.0 in Accession #:    [id number]      Weight:       178.6 lb Date of Birth:  [birth year]  BSA:          1.906 m Patient Age:    62 years        BP:           136/111 mmHg Patient Gender: M               HR:           110 bpm. Exam Location:  Inpatient Procedure: 2D Echo, Cardiac Doppler [name] Color Doppler Indications:    Atrial fibrillation  History:        Patient has no prior history of Echocardiogram examinations.                 Arrythmias:Atrial Fibrillation; Risk Factors:Hypertension [name]                 Dyslipidemia.  Sonographer:    Milda Smart Referring Phys: [id number] ALEXIS HUGELMEYER IMPRESSIONS  1. Left ventricular ejection fraction, by estimation, is 55 to 60%. The left ventricle has normal function. The left ventricle has no regional wall motion abnormalities. There is severe concentric left ventricular hypertrophy. Left ventricular diastolic  parameters are indeterminate.  2. Right ventricular systolic function is normal. The right ventricular size is normal.  3. The mitral valve is normal in structure. No evidence of mitral valve regurgitation. No evidence of mitral stenosis.  4. The aortic valve was not well visualized. Aortic valve regurgitation is not visualized. No aortic stenosis is present.  5. The inferior vena cava is normal in size with <50% respiratory variability, suggesting right atrial pressure of 8 mmHg. Comparison(s): No prior Echocardiogram. FINDINGS  Left Ventricle: Left ventricular ejection fraction, by estimation, is 55 to 60%. The left ventricle has normal function. The left ventricle has no regional wall motion abnormalities. The left ventricular internal cavity size was normal in size. There is  severe concentric left ventricular hypertrophy. Left ventricular diastolic parameters are  indeterminate. Right Ventricle: The right ventricular size is normal. No increase in right ventricular wall thickness. Right ventricular systolic function is normal. Left Atrium: Left atrial size was normal in size. Right Atrium: Right atrial size was normal in size. Pericardium: There is no evidence of pericardial effusion. Mitral Valve: The mitral valve is normal in structure. No evidence of mitral valve regurgitation. No evidence of mitral valve stenosis. Tricuspid Valve: The tricuspid valve is normal in structure. Tricuspid valve regurgitation is not demonstrated. No evidence of tricuspid stenosis. Aortic Valve: The aortic valve was not well visualized. Aortic valve regurgitation is not visualized. No aortic stenosis is present. Pulmonic Valve: The pulmonic valve was normal in structure. Pulmonic valve regurgitation is not visualized. No evidence of pulmonic stenosis. Aorta: The aortic root, ascending aorta [name] aortic arch are all structurally normal, with no evidence of dilitation or obstruction. Venous: The inferior vena cava is normal in size with less than 50% respiratory variability, suggesting right atrial pressure of 8 mmHg. IAS/Shunts: No atrial level shunt detected by color flow Doppler.  LEFT VENTRICLE PLAX 2D LVIDd:         3.95 cm     Diastology LVIDs:         3.20 cm     LV e' medial:  6.09 cm/s LV PW:         1.35 cm     LV e' lateral: 8.81 cm/s LV IVS:        1.30 cm  LV Volumes (MOD) LV vol d, MOD A2C: 59.9 ml LV vol d, MOD A4C: 69.8  ml LV vol s, MOD A2C: 25.1 ml LV vol s, MOD A4C: 24.6 ml LV SV MOD A2C:     34.8 ml LV SV MOD A4C:     69.8 ml LV SV MOD BP:      38.7 ml RIGHT VENTRICLE            IVC RV Basal diam:  3.30 cm    IVC diam: 1.90 cm RV S prime:     8.92 cm/s TAPSE (M-mode): 1.6 cm LEFT ATRIUM             Index        RIGHT ATRIUM           Index LA diam:        4.00 cm 2.10 cm/m   RA Area:     16.30 cm LA Vol (A2C):   54.8 ml 28.73 ml/m  RA Volume:   37.50 ml  19.68 ml/m LA Vol  (A4C):   37.3 ml 19.57 ml/m LA Biplane Vol: 50.4 ml 26.45 ml/m  AORTIC VALVE LVOT Vmax:   95.30 cm/s LVOT Vmean:  64.700 cm/s LVOT VTI:    0.181 m  SHUNTS Systemic VTI: 0.18 m Riley Lam Christian Electronically signed by Riley Lam Christian Signature Date/Time: 11/04/2022/12:40:48 PM    Final            LOS: 2 days   Time spent= 35 mins     Joline Maxcy, Christian [facility]  If 7PM-7AM, please contact night-coverage  11/06/2022, 10:14 AM

## 2022-11-06 NOTE — Progress Notes (Signed)
Mobility Specialist - Progress Note   11/06/22 1010  Mobility  Activity Ambulated independently in hallway  Level of Assistance Independent  Assistive Device None  Distance Ambulated (ft) 500 ft  Activity Response Tolerated well  Mobility Referral Yes  $Mobility charge 1 Mobility  Mobility Specialist Start Time (ACUTE ONLY) 1004  Mobility Specialist Stop Time (ACUTE ONLY) 1010  Mobility Specialist Time Calculation (min) (ACUTE ONLY) 6 min   Pt received in bed and agreeable to mobility. No complaints during session. Pt to bed after session with all needs met.    Pre-mobility: 95 HR During mobility: 114 HR Post-mobility: 97 HR  [facility] Paediatric nurse

## 2022-11-06 NOTE — Progress Notes (Signed)
   Patient Name: Christian [name] Date of Encounter: 11/06/2022 [facility] Cardiologist: Armanda Magic, MD   Interval Summary  .    Patient is feeling well today. No chest pain or palpitations. Breathing normal   Vital Signs .    Vitals:   11/05/22 1743 11/05/22 1950 11/06/22 0047 11/06/22 0426  BP: (!) 136/97 133/76 121/80 119/80  Pulse: 87 83 89 89  Resp: 18 19  19   Temp: 97.8 F (36.6 C) 98.8 F (37.1 C)  98.4 F (36.9 C)  TempSrc: Axillary Oral  Oral  SpO2: 100% 99%  97%  Weight:      Height:        Intake/Output Summary (Last 24 hours) at 11/06/2022 0850 Last data filed at 11/05/2022 2200 Gross per 24 hour  Intake 1260 ml  Output --  Net 1260 ml      11/03/2022    6:09 PM 10/30/2022    9:52 PM 08/12/2021    9:29 AM  Last 3 Weights  Weight (lbs) 178 lb 9.2 oz 180 lb 181 lb  Weight (kg) 81 kg 81.647 kg 82.101 kg      Telemetry/ECG    Atrial fibrillation. HR in the 80s-100s - Personally Reviewed  Physical Exam .   GEN: No acute distress.  Sitting upright on the side of the bed eating breakfast  Neck: No JVD Cardiac: Irregular rate and rhythm. no murmurs, rubs, or gallops. Radial pulses 2+ bilaterally Respiratory: Clear to auscultation bilaterally. Normal work of breathing on room air  GI: Soft, nontender, non-distended  MS: No edema in BLE    Assessment & Plan .     Afib with RVR- new since 10/30/22 RBBB - Patient presented with palpitations, SOB, fatigue. Symptoms started on 10/30/22 AM. Found to be in Afib with RVR - TSH within normal limits - hsTn negative x3  - Echocardiogram this admission with EF 55-60%, no regional wall motion abnormalities, severe LVH, normal RV function  - Now on metoprolol tartrate 50 mg BID, cardizem CD 120 mg daily  - Per telemetry, HR is now in the 90s-100s. Does have palpitations on exertion. Overall does feel much better  - CHADS- VASc 1 (HTN)- on eliquis 5 mg BID. Will need to remain on eliquis for 4 weeks  uninterrupted after DCCV. May be able to stop anticoagulation at that time  - Scheduled for TEE guided DCCV on 8/16 - needs to be NPO midnight 8/16. Orders placed   Severe LVH  - Noted on echocardiogram this admission.  - Can consider cardiac MRI as an outpatient to rule out HOCM. As LVH is concentric, suspect it is due to HTN   HTN  - BP well controlled on Cardizem CD 120 mg daily, losartan 100 mg daily, metoprolol tartrate 50 mg BID   For questions or updates, please contact [facility] Please consult [email or web address] for contact info under        Signed, Jonita Albee, PA-C

## 2022-11-06 NOTE — Plan of Care (Signed)
  Problem: Activity: Goal: Ability to tolerate increased activity will improve Outcome: Progressing   Problem: Education: Goal: Knowledge of General Education information will improve Description: Including pain rating scale, medication(s)/side effects and non-pharmacologic comfort measures Outcome: Progressing   Problem: Health Behavior/Discharge Planning: Goal: Ability to manage health-related needs will improve Outcome: Progressing   Problem: Clinical Measurements: Goal: Will remain free from infection Outcome: Progressing Goal: Diagnostic test results will improve Outcome: Progressing   Problem: Nutrition: Goal: Adequate nutrition will be maintained Outcome: Progressing

## 2022-11-07 ENCOUNTER — Other Ambulatory Visit (HOSPITAL_COMMUNITY): Payer: Medicare Other

## 2022-11-07 DIAGNOSIS — I4891 Unspecified atrial fibrillation: Secondary | ICD-10-CM | POA: Diagnosis not present

## 2022-11-07 LAB — CBC
HCT: 42.9 % (ref 39.0–52.0)
Hemoglobin: 14.1 g/dL (ref 13.0–17.0)
MCH: 28.9 pg (ref 26.0–34.0)
MCHC: 32.9 g/dL (ref 30.0–36.0)
MCV: 87.9 fL (ref 80.0–100.0)
Platelets: 208 10*3/uL (ref 150–400)
RBC: 4.88 MIL/uL (ref 4.22–5.81)
RDW: 14.2 % (ref 11.5–15.5)
WBC: 4.2 10*3/uL (ref 4.0–10.5)
nRBC: 0 % (ref 0.0–0.2)

## 2022-11-07 LAB — BASIC METABOLIC PANEL
Anion gap: 9 (ref 5–15)
BUN: 16 mg/dL (ref 8–23)
CO2: 24 mmol/L (ref 22–32)
Calcium: 8.3 mg/dL — ABNORMAL LOW (ref 8.9–10.3)
Chloride: 101 mmol/L (ref 98–111)
Creatinine, Ser: 0.68 mg/dL (ref 0.61–1.24)
GFR, Estimated: >60 mL/min (ref >60)
Glucose, Bld: 106 mg/dL — ABNORMAL HIGH (ref 70–99)
Potassium: 3.9 mmol/L (ref 3.5–5.1)
Sodium: 134 mmol/L — ABNORMAL LOW (ref 135–145)

## 2022-11-07 LAB — MAGNESIUM: Magnesium: 2.2 mg/dL (ref 1.7–2.4)

## 2022-11-07 LAB — GLUCOSE, CAPILLARY: Glucose-Capillary: 106 mg/dL — ABNORMAL HIGH (ref 70–99)

## 2022-11-07 SURGERY — ECHOCARDIOGRAM, TRANSESOPHAGEAL
Anesthesia: Monitor Anesthesia Care

## 2022-11-07 MED ORDER — DILTIAZEM HCL ER COATED BEADS 120 MG PO CP24: 120.0000 mg | ORAL_CAPSULE | Freq: Every day | ORAL | 0 refills | Status: AC

## 2022-11-07 MED ORDER — METOPROLOL TARTRATE 50 MG PO TABS: 50.0000 mg | ORAL_TABLET | Freq: Two times a day (BID) | ORAL | 0 refills | Status: DC

## 2022-11-07 NOTE — Plan of Care (Signed)
  Problem: Education: Goal: Knowledge of disease or condition will improve Outcome: Progressing   Problem: Activity: Goal: Ability to tolerate increased activity will improve Outcome: Progressing   Problem: Health Behavior/Discharge Planning: Goal: Ability to safely manage health-related needs after discharge will improve Outcome: Progressing   Problem: Health Behavior/Discharge Planning: Goal: Ability to manage health-related needs will improve Outcome: Progressing   Problem: Clinical Measurements: Goal: Diagnostic test results will improve Outcome: Progressing Goal: Respiratory complications will improve Outcome: Progressing

## 2022-11-07 NOTE — Discharge Summary (Signed)
Physician Discharge Summary  REA PARCHEM NWG:[id number] DOB: [birth year] DOA: 11/03/2022  PCP: Laqueta Due., MD  Admit date: 11/03/2022 Discharge date: 11/07/2022  Admitted From: Home Disposition: Home  Recommendations for Outpatient Follow-up:  Follow up with PCP in 1-2 weeks Please obtain BMP/CBC in one week your next doctors visit.  Continue your Eliquis.  Discontinue Coreg and Norvasc Instructions on taking Cardizem and metoprolol have been given.   Discharge Condition: Stable CODE STATUS: Full code heart Diet recommendation: Healthy  Brief/Interim Summary: 62 year old with history of chronic pain, HTN, HLD, osteoarthritis, spinal stenosis with recent diagnosis of A-fib started on Eliquis 3 days prior to admission comes to the hospital for palpitations.  Patient was recently seen in ED and diagnosed with atrial fibrillation and discharged home on Eliquis.  Patient seen by cardiology, changed to Lopressor.  TEE cardioversion was scheduled on 8/16 but prior to his transportation patient converted back to normal sinus rhythm therefore he was canceled.  Cleared by cardiology to be discharged home.  Will discontinue Coreg and Norvasc.  Patient is now on Cardizem and metoprolol.  Advised him to continue his Eliquis.  He is also been advised to hold off on his AV nodal blockers if his heart rate stays consistently below 70.  Will follow-up outpatient with cardiology.  Medically stable for discharge.     Assessment & Plan:  Principal Problem:   Atrial fibrillation (HCC) Active Problems:   Atrial flutter with rapid ventricular response (HCC)      Atrial fibrillation with RVR Intermittent A-fib with RVR.  Currently on Cardizem and metoprolol.  For now patient was Eliquis, seen by cardiology who is planning for TEE cardioversion on 8/16.  TSH is normal.  Echocardiogram shows EF of 60%.   Mild hypokalemia Replete and monitor electrolytes   History of chronic pain - Continued baclofen,  gabapentin, Dilaudid, tizanidine, Cymbalta   History of prediabetes On Victoza at admission.  A1c 5.8   History of GERD - Continue Protonix   History of hyperlipidemia - Continue lovastatin   History of hypertension Currently on Cardizem, losartan, metoprolol.   DVT prophylaxis: apixaban (ELIQUIS) tablet 5 mg       Consultations: [facility] cardiology  Subjective: He was well patient converted back to normal sinus rhythm spontaneously this morning.  Discharge Exam: Vitals:   11/07/22 0457 11/07/22 0736  BP: 133/76   Pulse: (!) 54   Resp: 18 18  Temp: 98.3 F (36.8 C)   SpO2: 99%    Vitals:   11/06/22 1259 11/06/22 2151 11/07/22 0457 11/07/22 0736  BP: (!) 132/91 (!) 131/91 133/76   Pulse: 95 96 (!) 54   Resp: (!) 22 20 18 18   Temp: 98.2 F (36.8 C) 98.9 F (37.2 C) 98.3 F (36.8 C)   TempSrc: Oral Oral Oral   SpO2: 99% 99% 99%   Weight:      Height:        General: Pt is alert, awake, not in acute distress Cardiovascular: RRR, S1/S2 +, no rubs, no gallops Respiratory: CTA bilaterally, no wheezing, no rhonchi Abdominal: Soft, NT, ND, bowel sounds + Extremities: no edema, no cyanosis  Discharge Instructions   Allergies as of 11/07/2022       Reactions   Pork-derived Products    PT DOES NOT EAT PORK        Medication List     STOP taking these medications    amLODipine 10 MG tablet Commonly known as: NORVASC   carvedilol 25  MG tablet Commonly known as: COREG       TAKE these medications    apixaban 5 MG Tabs tablet Commonly known as: ELIQUIS Take 1 tablet (5 mg total) by mouth 2 (two) times daily.   diltiazem 120 MG 24 hr capsule Commonly known as: CARDIZEM CD Take 1 capsule (120 mg total) by mouth daily. Start taking on: November 08, 2022   DULoxetine 30 MG capsule Commonly known as: CYMBALTA Take 30 mg by mouth 3 (three) times daily.   gabapentin 800 MG tablet Commonly known as: NEURONTIN Take 800 mg by mouth in the morning, at  noon, and at bedtime.   losartan 100 MG tablet Commonly known as: COZAAR Take 100 mg by mouth in the morning.   lovastatin 40 MG tablet Commonly known as: MEVACOR Take 40 mg by mouth at bedtime.   meloxicam 15 MG tablet Commonly known as: MOBIC Take 15 mg by mouth daily.   metoprolol tartrate 50 MG tablet Commonly known as: LOPRESSOR Take 1 tablet (50 mg total) by mouth 2 (two) times daily.   pantoprazole 40 MG tablet Commonly known as: PROTONIX Take 40 mg by mouth every evening.   tiZANidine 4 MG tablet Commonly known as: ZANAFLEX Take 4 mg by mouth in the morning, at noon, and at bedtime.        Allergies  Allergen Reactions   Pork-Derived Products     PT DOES NOT EAT PORK    You were cared for by a hospitalist during your hospital stay. If you have any questions about your discharge medications or the care you received while you were in the hospital after you are discharged, you can call the unit and asked to speak with the hospitalist on call if the hospitalist that took care of you is not available. Once you are discharged, your primary care physician will handle any further medical issues. Please note that no refills for any discharge medications will be authorized once you are discharged, as it is imperative that you return to your primary care physician (or establish a relationship with a primary care physician if you do not have one) for your aftercare needs so that they can reassess your need for medications and monitor your lab values.  You were cared for by a hospitalist during your hospital stay. If you have any questions about your discharge medications or the care you received while you were in the hospital after you are discharged, you can call the unit and asked to speak with the hospitalist on call if the hospitalist that took care of you is not available. Once you are discharged, your primary care physician will handle any further medical issues. Please note  that NO REFILLS for any discharge medications will be authorized once you are discharged, as it is imperative that you return to your primary care physician (or establish a relationship with a primary care physician if you do not have one) for your aftercare needs so that they can reassess your need for medications and monitor your lab values.  Please request your Prim.MD to go over all Hospital Tests and Procedure/Radiological results at the follow up, please get all Hospital records sent to your Prim MD by signing hospital release before you go home.  Get CBC, CMP, 2 view Chest X ray checked  by Primary MD during your next visit or SNF MD in 5-7 days ( we routinely change or add medications that can affect your baseline labs and fluid status, therefore  we recommend that you get the mentioned basic workup next visit with your PCP, your PCP may decide not to get them or add new tests based on their clinical decision)  On your next visit with your primary care physician please Get Medicines reviewed and adjusted.  If you experience worsening of your admission symptoms, develop shortness of breath, life threatening emergency, suicidal or homicidal thoughts you must seek medical attention immediately by calling 911 or calling your MD immediately  if symptoms less severe.  You Must read complete instructions/literature along with all the possible adverse reactions/side effects for all the Medicines you take and that have been prescribed to you. Take any new Medicines after you have completely understood and accpet all the possible adverse reactions/side effects.   Do not drive, operate heavy machinery, perform activities at heights, swimming or participation in water activities or provide baby sitting services if your were admitted for syncope or siezures until you have seen by Primary MD or a Neurologist and advised to do so again.  Do not drive when taking Pain  medications.   Procedures/Studies: ECHOCARDIOGRAM COMPLETE  Result Date: 11/04/2022    ECHOCARDIOGRAM REPORT   Patient Name:   AADON ROUTHIER Date of Exam: 11/04/2022 Medical Rec #:  [id number]       Height:       66.0 in Accession #:    [id number]      Weight:       178.6 lb Date of Birth:  [birth year]       BSA:          1.906 m Patient Age:    61 years        BP:           136/111 mmHg Patient Gender: M               HR:           110 bpm. Exam Location:  Inpatient Procedure: 2D Echo, Cardiac Doppler and Color Doppler Indications:    Atrial fibrillation  History:        Patient has no prior history of Echocardiogram examinations.                 Arrythmias:Atrial Fibrillation; Risk Factors:Hypertension and                 Dyslipidemia.  Sonographer:    Milda Smart Referring Phys: [id number] ALEXIS HUGELMEYER IMPRESSIONS  1. Left ventricular ejection fraction, by estimation, is 55 to 60%. The left ventricle has normal function. The left ventricle has no regional wall motion abnormalities. There is severe concentric left ventricular hypertrophy. Left ventricular diastolic  parameters are indeterminate.  2. Right ventricular systolic function is normal. The right ventricular size is normal.  3. The mitral valve is normal in structure. No evidence of mitral valve regurgitation. No evidence of mitral stenosis.  4. The aortic valve was not well visualized. Aortic valve regurgitation is not visualized. No aortic stenosis is present.  5. The inferior vena cava is normal in size with <50% respiratory variability, suggesting right atrial pressure of 8 mmHg. Comparison(s): No prior Echocardiogram. FINDINGS  Left Ventricle: Left ventricular ejection fraction, by estimation, is 55 to 60%. The left ventricle has normal function. The left ventricle has no regional wall motion abnormalities. The left ventricular internal cavity size was normal in size. There is  severe concentric left ventricular hypertrophy. Left  ventricular diastolic parameters are indeterminate. Right Ventricle: The right ventricular size is  normal. No increase in right ventricular wall thickness. Right ventricular systolic function is normal. Left Atrium: Left atrial size was normal in size. Right Atrium: Right atrial size was normal in size. Pericardium: There is no evidence of pericardial effusion. Mitral Valve: The mitral valve is normal in structure. No evidence of mitral valve regurgitation. No evidence of mitral valve stenosis. Tricuspid Valve: The tricuspid valve is normal in structure. Tricuspid valve regurgitation is not demonstrated. No evidence of tricuspid stenosis. Aortic Valve: The aortic valve was not well visualized. Aortic valve regurgitation is not visualized. No aortic stenosis is present. Pulmonic Valve: The pulmonic valve was normal in structure. Pulmonic valve regurgitation is not visualized. No evidence of pulmonic stenosis. Aorta: The aortic root, ascending aorta and aortic arch are all structurally normal, with no evidence of dilitation or obstruction. Venous: The inferior vena cava is normal in size with less than 50% respiratory variability, suggesting right atrial pressure of 8 mmHg. IAS/Shunts: No atrial level shunt detected by color flow Doppler.  LEFT VENTRICLE PLAX 2D LVIDd:         3.95 cm     Diastology LVIDs:         3.20 cm     LV e' medial:  6.09 cm/s LV PW:         1.35 cm     LV e' lateral: 8.81 cm/s LV IVS:        1.30 cm  LV Volumes (MOD) LV vol d, MOD A2C: 59.9 ml LV vol d, MOD A4C: 69.8 ml LV vol s, MOD A2C: 25.1 ml LV vol s, MOD A4C: 24.6 ml LV SV MOD A2C:     34.8 ml LV SV MOD A4C:     69.8 ml LV SV MOD BP:      38.7 ml RIGHT VENTRICLE            IVC RV Basal diam:  3.30 cm    IVC diam: 1.90 cm RV S prime:     8.92 cm/s TAPSE (M-mode): 1.6 cm LEFT ATRIUM             Index        RIGHT ATRIUM           Index LA diam:        4.00 cm 2.10 cm/m   RA Area:     16.30 cm LA Vol (A2C):   54.8 ml 28.73 ml/m  RA  Volume:   37.50 ml  19.68 ml/m LA Vol (A4C):   37.3 ml 19.57 ml/m LA Biplane Vol: 50.4 ml 26.45 ml/m  AORTIC VALVE LVOT Vmax:   95.30 cm/s LVOT Vmean:  64.700 cm/s LVOT VTI:    0.181 m  SHUNTS Systemic VTI: 0.18 m Riley Lam MD Electronically signed by Riley Lam MD Signature Date/Time: 11/04/2022/12:40:48 PM    Final    DG Chest Port 1 View  Result Date: 11/03/2022 CLINICAL DATA:  Shortness of breath. EXAM: PORTABLE CHEST 1 VIEW COMPARISON:  10/30/2022. FINDINGS: Low lung volumes accentuate the pulmonary vasculature and cardiomediastinal silhouette. No consolidation or pulmonary edema. No pleural effusion or pneumothorax. IMPRESSION: Low lung volumes without evidence of acute cardiopulmonary disease. Electronically Signed   By: Orvan Falconer M.D.   On: 11/03/2022 20:07   DG Chest 2 View  Result Date: 10/30/2022 CLINICAL DATA:  cp, shob EXAM: CHEST - 2 VIEW COMPARISON:  11/29/2012 FINDINGS: The heart size and mediastinal contours are within normal limits. Both lungs are clear. The visualized skeletal  structures are unremarkable. IMPRESSION: No active cardiopulmonary disease. Electronically Signed   By: Charlett Nose M.D.   On: 10/30/2022 22:05     The results of significant diagnostics from this hospitalization (including imaging, microbiology, ancillary and laboratory) are listed below for reference.     Microbiology: No results found for this or any previous visit (from the past 240 hour(s)).   Labs: BNP (last 3 results) Recent Labs    10/30/22 2338 11/03/22 1832  BNP 532.5* 331.2*   Basic Metabolic Panel: Recent Labs  Lab 11/03/22 1832 11/04/22 0602 11/05/22 0500 11/06/22 0447 11/07/22 0432  NA 136 136 137 134* 134*  K 3.4* 3.8 3.8 3.6 3.9  CL 106 105 105 104 101  CO2 23 22 24  21* 24  GLUCOSE 161* 108* 110* 126* 106*  BUN 17 15 18 20 16   CREATININE 0.74 0.57* 0.52* 0.75 0.68  CALCIUM 8.5* 8.3* 8.6* 8.2* 8.3*  MG 2.1 2.2 2.1 2.4 2.2  PHOS  --  3.6  --    --   --    Liver Function Tests: Recent Labs  Lab 11/03/22 1832 11/04/22 0602 11/05/22 0500  AST 16 12* 13*  ALT 14 13 14   ALKPHOS 65 59 60  BILITOT 0.6 0.4 0.7  PROT 6.9 6.1* 6.2*  ALBUMIN 3.6 3.2* 3.3*   No results for input(s): "LIPASE", "AMYLASE" in the last 168 hours. No results for input(s): "AMMONIA" in the last 168 hours. CBC: Recent Labs  Lab 11/03/22 1832 11/04/22 0602 11/05/22 0500 11/06/22 0447 11/07/22 0432  WBC 4.8 3.8* 4.0 4.4 4.2  HGB 16.1 14.3 14.9 14.5 14.1  HCT 48.5 44.4 44.3 43.7 42.9  MCV 89.0 91.0 88.1 89.7 87.9  PLT 208 PLATELET CLUMPS NOTED ON SMEAR, UNABLE TO ESTIMATE 217 193 208   Cardiac Enzymes: No results for input(s): "CKTOTAL", "CKMB", "CKMBINDEX", "TROPONINI" in the last 168 hours. BNP: Invalid input(s): "POCBNP" CBG: Recent Labs  Lab 11/06/22 0721 11/06/22 1122 11/06/22 1629 11/06/22 2146 11/07/22 0736  GLUCAP 114* 142* 89 134* 106*   D-Dimer No results for input(s): "DDIMER" in the last 72 hours. Hgb A1c Recent Labs    11/04/22 1311  HGBA1C 5.8*   Lipid Profile No results for input(s): "CHOL", "HDL", "LDLCALC", "TRIG", "CHOLHDL", "LDLDIRECT" in the last 72 hours. Thyroid function studies No results for input(s): "TSH", "T4TOTAL", "T3FREE", "THYROIDAB" in the last 72 hours.  Invalid input(s): "FREET3" Anemia work up No results for input(s): "VITAMINB12", "FOLATE", "FERRITIN", "TIBC", "IRON", "RETICCTPCT" in the last 72 hours. Urinalysis    Component Value Date/Time   COLORURINE YELLOW 10/18/2011 2302   APPEARANCEUR TURBID (A) 10/18/2011 2302   LABSPEC 1.020 10/18/2011 2302   PHURINE 5.5 10/18/2011 2302   GLUCOSEU NEGATIVE 10/18/2011 2302   HGBUR LARGE (A) 10/18/2011 2302   BILIRUBINUR NEGATIVE 10/18/2011 2302   KETONESUR NEGATIVE 10/18/2011 2302   PROTEINUR NEGATIVE 10/18/2011 2302   UROBILINOGEN 0.2 10/18/2011 2302   NITRITE NEGATIVE 10/18/2011 2302   LEUKOCYTESUR SMALL (A) 10/18/2011 2302   Sepsis  Labs Recent Labs  Lab 11/04/22 0602 11/05/22 0500 11/06/22 0447 11/07/22 0432  WBC 3.8* 4.0 4.4 4.2   Microbiology No results found for this or any previous visit (from the past 240 hour(s)).   Time coordinating discharge:  I have spent 35 minutes face to face with the patient and on the ward discussing the patients care, assessment, plan and disposition with other care givers. >50% of the time was devoted counseling the patient about the risks and  benefits of treatment/Discharge disposition and coordinating care.   SIGNED:   Miguel Rota, MD  [facility] 11/07/2022, 12:25 PM   If 7PM-7AM, please contact night-coverage

## 2022-11-20 ENCOUNTER — Ambulatory Visit (HOSPITAL_COMMUNITY)
Admit: 2022-11-20 | Discharge: 2022-11-20 | Disposition: A | Discharge status: Home / Self Care | Payer: Medicare Other | Source: Ambulatory Visit | Attending: Internal Medicine | Admitting: Internal Medicine

## 2022-11-20 VITALS — BP 220/86 | HR 49 | Ht 66.0 in | Wt 195.4 lb

## 2022-11-20 DIAGNOSIS — I10 Essential (primary) hypertension: Secondary | ICD-10-CM | POA: Diagnosis not present

## 2022-11-20 DIAGNOSIS — R0789 Other chest pain: Secondary | ICD-10-CM | POA: Diagnosis present

## 2022-11-20 DIAGNOSIS — G4733 Obstructive sleep apnea (adult) (pediatric): Secondary | ICD-10-CM | POA: Diagnosis not present

## 2022-11-20 DIAGNOSIS — I480 Paroxysmal atrial fibrillation: Secondary | ICD-10-CM | POA: Diagnosis present

## 2022-11-20 DIAGNOSIS — I4891 Unspecified atrial fibrillation: Secondary | ICD-10-CM

## 2022-11-20 DIAGNOSIS — Z7901 Long term (current) use of anticoagulants: Secondary | ICD-10-CM | POA: Insufficient documentation

## 2022-11-20 MED ORDER — CLONIDINE HCL 0.1 MG PO TABS: 0.1000 mg | ORAL_TABLET | Freq: Two times a day (BID) | ORAL | 3 refills | Status: DC

## 2022-11-20 NOTE — Patient Instructions (Addendum)
Kardia mobile   Start Clonidine 0.1mg  twice a day -- continue to monitor blood pressure at home

## 2022-11-20 NOTE — Progress Notes (Signed)
Primary Care Physician: Laqueta Due., MD Primary Cardiologist: Armanda Magic, MD Electrophysiologist: None     Referring Physician: Dr. Alferd Apa Christian [name] is a 62 y.o. male with a history of HTN, HLD, OSA not compliant with CPAP, chronic pain, OA, spinal stenosis, and paroxysmal atrial fibrillation who presents for consultation in the [facility]. ED visit for chest pain on 8/8 found to be in Afib with RVR. Recent hospital admission 8/12-16/24 for Afib with RVR. He converted to NSR so cardioversion was canceled. He is on cardizem and metoprolol. He is scheduled for an MRI to rule out HOCM due to severe concentric LVH on echo. Patient is on Eliquis for a CHADS2VASC score of 1.  On evaluation today, he is currently in NSR. His systolic BP today is significantly elevated. He denies chest pain, blurry vision, or HA. He does admit to be nervous here in clinic today. He just took his BP medication 30 minutes prior to OV. No missed doses of Eliquis. He does not have a rhythm monitoring device. He does not drink alcohol. He drinks 1-2 cups of caffeinated beverages daily. He admits to not using CPAP because it is too uncomfortable.   Today, he denies symptoms of palpitations, shortness of breath, orthopnea, PND, lower extremity edema, dizziness, presyncope, syncope, snoring, daytime somnolence, bleeding, or neurologic sequela. The patient is tolerating medications without difficulties and is otherwise without complaint today.    Atrial Fibrillation Risk Factors:  he does have symptoms or diagnosis of sleep apnea. he is not compliant with CPAP therapy.  he has a BMI of Body mass index is 31.54 kg/m.Marland Kitchen Filed Weights   11/20/22 0940  Weight: 88.6 kg    Current Outpatient Medications  Medication Sig Dispense Refill   apixaban (ELIQUIS) 5 MG TABS tablet Take 1 tablet (5 mg total) by mouth 2 (two) times daily. 60 tablet 0   cloNIDine (CATAPRES) 0.1 MG tablet  Take 1 tablet (0.1 mg total) by mouth 2 (two) times daily. 60 tablet 3   diltiazem (CARDIZEM CD) 120 MG 24 hr capsule Take 1 capsule (120 mg total) by mouth daily. 30 capsule 0   DULoxetine (CYMBALTA) 30 MG capsule Take 30 mg by mouth 3 (three) times daily.     gabapentin (NEURONTIN) 800 MG tablet Take 800 mg by mouth in the morning, at noon, and at bedtime.     HYDROcodone-acetaminophen (NORCO/VICODIN) 5-325 MG tablet Take 1 tablet by mouth 2 (two) times daily as needed.     losartan (COZAAR) 100 MG tablet Take 100 mg by mouth in the morning.     lovastatin (MEVACOR) 40 MG tablet Take 40 mg by mouth at bedtime.     meloxicam (MOBIC) 15 MG tablet Take 15 mg by mouth daily.     metoprolol tartrate (LOPRESSOR) 50 MG tablet Take 1 tablet (50 mg total) by mouth 2 (two) times daily. 60 tablet 0   pantoprazole (PROTONIX) 40 MG tablet Take 40 mg by mouth every evening.     tiZANidine (ZANAFLEX) 4 MG tablet Take 4 mg by mouth in the morning, at noon, and at bedtime.     No current facility-administered medications for this encounter.    Atrial Fibrillation Management history:  Previous antiarrhythmic drugs: None Previous cardioversions: None Previous ablations: None Anticoagulation history: Eliquis   ROS- All systems are reviewed and negative except as per the HPI above.  Physical Exam: BP (!) 220/86 Comment: right arm 192/70  Pulse (!) 49   Ht 5\' 6"  (1.676 m)   Wt 88.6 kg   BMI 31.54 kg/m   GEN: Well nourished, well developed in no acute distress NECK: No JVD; No carotid bruits CARDIAC: Regular bradycardic rate and rhythm, no murmurs, rubs, gallops RESPIRATORY:  Clear to auscultation without rales, wheezing or rhonchi  ABDOMEN: Soft, non-tender, non-distended EXTREMITIES:  No edema; No deformity   EKG today demonstrates  Vent. rate 49 BPM PR interval 198 ms QRS duration 120 ms QT/QTcB 464/419 ms P-R-T axes -9 10 22  Sinus bradycardia Non-specific intra-ventricular conduction  delay Borderline ECG When compared with ECG of 07-Nov-2022 10:57, PREVIOUS ECG IS PRESENT  Echo 11/04/22 demonstrated  1. Left ventricular ejection fraction, by estimation, is 55 to 60%. The  left ventricle has normal function. The left ventricle has no regional  wall motion abnormalities. There is severe concentric left ventricular  hypertrophy. Left ventricular diastolic   parameters are indeterminate.   2. Right ventricular systolic function is normal. The right ventricular  size is normal.   3. The mitral valve is normal in structure. No evidence of mitral valve  regurgitation. No evidence of mitral stenosis.   4. The aortic valve was not well visualized. Aortic valve regurgitation  is not visualized. No aortic stenosis is present.   5. The inferior vena cava is normal in size with <50% respiratory  variability, suggesting right atrial pressure of 8 mmHg.    ASSESSMENT & PLAN CHA2DS2-VASc Score = 1  The patient's score is based upon: CHF History: 0 HTN History: 1 Diabetes History: 0 Stroke History: 0 Vascular Disease History: 0 Age Score: 0 Gender Score: 0       ASSESSMENT AND PLAN: Paroxysmal Atrial Fibrillation (ICD10:  I48.0) The patient's CHA2DS2-VASc score is 1, indicating a 0.6% annual risk of stroke.    He is currently in NSR.  Education provided about Afib. Discussion about medication treatments and ablation going forward if indicated. After discussion, we will proceed with conservative observation at this time. Rhythm monitoring device recommended.   Regarding his significantly elevated BP in office today, repeat BP is similar. After discussion with cardiology, recommend beginning clonidine 0.1 mg BID and to use his BP cuff at home to monitor BP. ED precautions given if he develops symptoms with elevated BP. I will plan to see him 1 month from cardiology visit and will repeat CBC.  Advised to continue Eliquis for now (score of 1) so we can reassess Afib burden.  Strongly urged patient to speak with ordering provider for CPAP as to whether he's a candidate for a different mask or treatment.   Follow up October in Afib clinic.   Lake Bells, PA-C  Afib [facility] [address] [place], [place] [address] [phone]

## 2022-12-03 NOTE — Progress Notes (Unsigned)
Cardiology Office Note    Patient Name: Christian [name] Date of Encounter: 12/04/2022  Primary Care Provider:  Laqueta Due., MD Primary Cardiologist:  Armanda Magic, MD Primary Electrophysiologist: None   Past Medical History    Past Medical History:  Diagnosis Date   Chronic back pain    Chronic knee pain    DDD (degenerative disc disease), lumbosacral    History of kidney stones    Hyperlipidemia    Hypertension    Kidney stone    OA (osteoarthritis)    knees   Spinal stenosis, lumbar region with neurogenic claudication    Weakness of extremity    per pt upper and lower extermities due to back issues    History of Present Illness  Christian [name] is a 62 y.o. male with a PMH of AF with RVR, HTN, HLD, osteoarthritis, OSA (noncompliant with CPAP) chronic back pain who presents today for posthospital follow-up.  Mr.Crew was seen initially on on 10/30/2022 with complaint of heart palpitations and was found to have new onset AF with RVR.  He was started on Eliquis and discharged with referral to AF clinic.  He was never contacted by referral and presented back to the ED on 11/04/2022 with shortness of breath and palpitations.  Diagnostic workup was completed showing at bedtime troponins flat and hypokalemia with BNP of 331.  2D echo was completed showing EF of 55-60% with no valvular disease and severe concentric LVH.  He is scheduled for cardiac MRI with gadolinium to rule out possible hypertrophic obstructive cardiomyopathy arrange for cardioversion however converted prior to procedure and this was canceled.  He was discharged on Cardizem and metoprolol and was seen in the AF clinic by [place], PA on 11/20/2022 was in sinus rhythm.  Patient's BP was elevated due to nervousness and patient was started on clonidine 0.1 mg twice daily.  He was advised to continue Eliquis for CHA2DS2-VASc score of 1.  During today's visit the patient reports that his blood pressure is remaining  elevated despite new medication.  During today's visit his initial blood pressure was 172/68 and repeat BP was 168/72.  He is compliant with his medications and denies any adverse reactions.  He is currently rate controlled at 56 bpm and reports no episodes of atrial fibrillation since his follow-up visit.  During today's visit we discussed modifiable and nonmodifiable risk factors related to hypertension.  He is currently a smoker and we discussed smoking cessation and patient was also advised to abstain from excess caffeine.  He reports some symptoms such as headache when his blood pressures are elevated.  We discussed the warning signs of stroke and the need to seek help in the ED if headache increases in intensity and is not relieved with medication.  Patient denies chest pain, palpitations, dyspnea, PND, orthopnea, nausea, vomiting, dizziness, syncope, edema, weight gain, or early satiety.   Review of Systems  Please see the history of present illness.    All other systems reviewed and are otherwise negative except as noted above.  Physical Exam    Wt Readings from Last 3 Encounters:  12/04/22 196 lb 3.2 oz (89 kg)  11/20/22 195 lb 6.4 oz (88.6 kg)  11/03/22 178 lb 9.2 oz (81 kg)   VS: Vitals:   12/04/22 1439 12/04/22 1453  BP: (!) 172/68 (!) 168/72  Pulse: (!) 56   SpO2: 97%   ,Body mass index is 31.67 kg/m. GEN: Well nourished, well developed in no acute  distress Neck: No JVD; No carotid bruits Pulmonary: Clear to auscultation without rales, wheezing or rhonchi  Cardiovascular: Normal rate. Regular rhythm. Normal S1. Normal S2.   Murmurs: There is no murmur.  ABDOMEN: Soft, non-tender, non-distended EXTREMITIES:  No edema; No deformity   EKG/LABS/ Recent Cardiac Studies   ECG personally reviewed by me today -none completed today  Risk Assessment/Calculations:    CHA2DS2-VASc Score = 1   This indicates a 0.6% annual risk of stroke. The patient's score is based upon: CHF  History: 0 HTN History: 1 Diabetes History: 0 Stroke History: 0 Vascular Disease History: 0 Age Score: 0 Gender Score: 0          Lab Results  Component Value Date   WBC 4.2 11/07/2022   HGB 14.1 11/07/2022   HCT 42.9 11/07/2022   MCV 87.9 11/07/2022   PLT 208 11/07/2022   Lab Results  Component Value Date   CREATININE 0.68 11/07/2022   BUN 16 11/07/2022   NA 134 (L) 11/07/2022   K 3.9 11/07/2022   CL 101 11/07/2022   CO2 24 11/07/2022   Lab Results  Component Value Date   CHOL 146 11/04/2022   HDL 44 11/04/2022   LDLCALC 80 11/04/2022   TRIG 111 11/04/2022   CHOLHDL 3.3 11/04/2022    Lab Results  Component Value Date   HGBA1C 5.8 (H) 11/04/2022   Assessment & Plan    1.  Paroxysmal AF: -Patient currently on rate control with Cardizem 120 mg daily and metoprolol 50 mg twice daily -Patient has CHA2DS2-VASc score 1 and is currently on Eliquis 5 mg twice daily -CHA2DS2-VASc Score = 1 [CHF History: 0, HTN History: 1, Diabetes History: 0, Stroke History: 0, Vascular Disease History: 0, Age Score: 0, Gender Score: 0].  Therefore, the patient's annual risk of stroke is 0.6 %.      2.  Essential hypertension: -HYPERTENSION CONTROL Vitals:   12/04/22 1439 12/04/22 1453  BP: (!) 172/68 (!) 168/72    The patient's blood pressure is elevated above target today.  In order to address the patient's elevated BP: A current anti-hypertensive medication was adjusted today.; Blood pressure will be monitored at home to determine if medication changes need to be made.; A new medication was prescribed today.; Follow up with general cardiology has been recommended.    - We will discontinue metoprolol today and start Coreg 25 mg twice daily -Continue clonidine 0.1 mg twice daily, losartan 100 mg daily, hydralazine 25 mg 3 times daily  3.  Hyperlipidemia: -Patient's last LDL cholesterol was 80 -Continue lovastatin 40 mg daily  4.  Concentric left ventricular hypertrophy: -2D  echo completed on showing EF of 55-60% with no RWMA and severe concentric LVH with normal RV function with no valvular abnormalities -Patient is scheduled to undergo MRI with gadolinium to rule out hypertrophic obstructive cardiomyopathy  5.  Tobacco abuse: -Patient reports a 30 pack/year smoking habit -He is currently smoking half a pack per day -I advised him to contact 1 800 quit NOW for guidance and support on stopping. -We will discuss further at his upcoming follow-up  Disposition: Follow-up with Armanda Magic, MD or APP in 1 months    Signed, Napoleon Form, Leodis Rains, NP 12/04/2022, 5:37 PM [facility]

## 2022-12-04 ENCOUNTER — Encounter: Payer: Self-pay | Admitting: Nurse Practitioner

## 2022-12-04 ENCOUNTER — Ambulatory Visit: Payer: Medicare Other | Attending: Nurse Practitioner | Admitting: Nurse Practitioner

## 2022-12-04 VITALS — BP 168/72 | HR 56 | Ht 66.0 in | Wt 196.2 lb

## 2022-12-04 DIAGNOSIS — E785 Hyperlipidemia, unspecified: Secondary | ICD-10-CM | POA: Insufficient documentation

## 2022-12-04 DIAGNOSIS — Z720 Tobacco use: Secondary | ICD-10-CM | POA: Insufficient documentation

## 2022-12-04 DIAGNOSIS — I517 Cardiomegaly: Secondary | ICD-10-CM | POA: Insufficient documentation

## 2022-12-04 DIAGNOSIS — I480 Paroxysmal atrial fibrillation: Secondary | ICD-10-CM | POA: Diagnosis not present

## 2022-12-04 DIAGNOSIS — I10 Essential (primary) hypertension: Secondary | ICD-10-CM | POA: Insufficient documentation

## 2022-12-04 MED ORDER — CARVEDILOL 25 MG PO TABS: 25.0000 mg | ORAL_TABLET | Freq: Two times a day (BID) | ORAL | 1 refills | Status: AC

## 2022-12-04 MED ORDER — HYDRALAZINE HCL 25 MG PO TABS: 25.0000 mg | ORAL_TABLET | Freq: Two times a day (BID) | ORAL | Status: DC

## 2022-12-04 NOTE — Patient Instructions (Addendum)
Medication Instructions:  STOP Metoprolol START Coreg 25mg  Take 1 tablet twice a day  DECREASE Hydralazine to 25mg  Take 1 tablet twice a day  *If you need a refill on your cardiac medications before your next appointment, please call your pharmacy*   Lab Work: None ordered If you have labs (blood work) drawn today and your tests are completely normal, you will receive your results only by: MyChart Message (if you have MyChart) OR A paper copy in the mail If you have any lab test that is abnormal or we need to change your treatment, we will call you to review the results.   Testing/Procedures: None ordered   Follow-Up: At [facility], you and your health needs are our priority.  As part of our continuing mission to provide you with exceptional heart care, we have created designated Provider Care Teams.  These Care Teams include your primary Cardiologist (physician) and Advanced Practice Providers (APPs -  Physician Assistants and Nurse Practitioners) who all work together to provide you with the care you need, when you need it.  We recommend signing up for the patient portal called "MyChart".  Sign up information is provided on this After Visit Summary.  MyChart is used to connect with patients for Virtual Visits (Telemedicine).  Patients are able to view lab/test results, encounter notes, upcoming appointments, etc.  Non-urgent messages can be sent to your provider as well.   To learn more about what you can do with MyChart, go to [email or web address].    Your next appointment:   1 month(s)  Provider:   Robin Searing, NP       Other Instructions: Please increase your physical activity. Please exercise for 150 minutes a week or 30 minutes a day.  STOP SMOKING; CALL 1-800-QUIT-NOW Check your blood pressure daily for 2 weeks, then contact the office with your readings.  Make sure to check 2 hours after your medications.   AVOID these things for 30 minutes before checking  your blood pressure: No Drinking caffeine. No Drinking alcohol. No Eating. No Smoking. No Exercising.  Five minutes before checking your blood pressure: Pee. Sit in a dining chair. Avoid sitting in a soft couch or armchair. Be quiet. Do not talk.

## 2022-12-11 ENCOUNTER — Telehealth: Payer: Self-pay | Admitting: Cardiology

## 2022-12-11 MED ORDER — HYDRALAZINE HCL 50 MG PO TABS: 50.0000 mg | ORAL_TABLET | Freq: Three times a day (TID) | ORAL | 3 refills | Status: DC

## 2022-12-11 NOTE — Telephone Encounter (Signed)
Pt c/o BP issue: STAT if pt c/o blurred vision, one-sided weakness or slurred speech  1. What are your last 5 BP readings?  9/19    9:00 am - 198/98  HR 55    2:00 pm - 211/99  HR 59 9/18 - 3:00 pm - 211/98  HR 58 9/17 - 9:00 am - 212/104  HR 54 9/17 - 12:00 noon - 208/97  HR 53  2. Are you having any other symptoms (ex. Dizziness, headache, blurred vision, passed out)?   Headache  3. What is your BP issue?   Patient stated he has a bad headache and feels like " his eyes are going to pop out of his face".

## 2022-12-11 NOTE — Telephone Encounter (Signed)
Call sent straight to triage. Patient advised to call 911 with high BP and headache and eyes feel like they are going to pop out of his head. Encouraged patient to go to ED or Urgent Care if he does not want to call 911. Patient only wants to talk to Robin Searing NP. Will forward this call to him.

## 2022-12-11 NOTE — Telephone Encounter (Signed)
Please advise patient to increase hydralazine to 50 mg TID and continue to monitor BP. If he continues to feel poorly then advise him to go to the ED at [facility] or [facility] for assistance. Please let me know if he has any further questions.  Robin Searing, NP    Called patient with Christian [name]'s recommendations. Patient verbalized understanding and will increase his hydralazine to 50 mg by mouth 3 times a day.

## 2022-12-15 ENCOUNTER — Encounter (HOSPITAL_BASED_OUTPATIENT_CLINIC_OR_DEPARTMENT_OTHER): Payer: Self-pay | Admitting: Emergency Medicine

## 2022-12-15 ENCOUNTER — Emergency Department (HOSPITAL_BASED_OUTPATIENT_CLINIC_OR_DEPARTMENT_OTHER)
Admission: EM | Admit: 2022-12-15 | Discharge: 2022-12-15 | Disposition: A | Discharge status: Home / Self Care | Payer: Medicare Other | Attending: Emergency Medicine | Admitting: Emergency Medicine

## 2022-12-15 ENCOUNTER — Other Ambulatory Visit: Payer: Self-pay

## 2022-12-15 ENCOUNTER — Emergency Department (HOSPITAL_BASED_OUTPATIENT_CLINIC_OR_DEPARTMENT_OTHER): Payer: Medicare Other

## 2022-12-15 DIAGNOSIS — I10 Essential (primary) hypertension: Secondary | ICD-10-CM | POA: Diagnosis not present

## 2022-12-15 DIAGNOSIS — Z79899 Other long term (current) drug therapy: Secondary | ICD-10-CM | POA: Insufficient documentation

## 2022-12-15 DIAGNOSIS — Z7901 Long term (current) use of anticoagulants: Secondary | ICD-10-CM | POA: Diagnosis not present

## 2022-12-15 DIAGNOSIS — R519 Headache, unspecified: Secondary | ICD-10-CM | POA: Diagnosis present

## 2022-12-15 LAB — CBC WITH DIFFERENTIAL/PLATELET
Abs Immature Granulocytes: 0.01 10*3/uL (ref 0.00–0.07)
Basophils Absolute: 0 10*3/uL (ref 0.0–0.1)
Basophils Relative: 1 %
Eosinophils Absolute: 0.1 10*3/uL (ref 0.0–0.5)
Eosinophils Relative: 4 %
HCT: 45.2 % (ref 39.0–52.0)
Hemoglobin: 15.1 g/dL (ref 13.0–17.0)
Immature Granulocytes: 0 %
Lymphocytes Relative: 39 %
Lymphs Abs: 1.5 10*3/uL (ref 0.7–4.0)
MCH: 29.4 pg (ref 26.0–34.0)
MCHC: 33.4 g/dL (ref 30.0–36.0)
MCV: 87.9 fL (ref 80.0–100.0)
Monocytes Absolute: 0.5 10*3/uL (ref 0.1–1.0)
Monocytes Relative: 13 %
Neutro Abs: 1.7 10*3/uL (ref 1.7–7.7)
Neutrophils Relative %: 43 %
Platelets: 223 10*3/uL (ref 150–400)
RBC: 5.14 MIL/uL (ref 4.22–5.81)
RDW: 13.9 % (ref 11.5–15.5)
WBC: 3.8 10*3/uL — ABNORMAL LOW (ref 4.0–10.5)
nRBC: 0 % (ref 0.0–0.2)

## 2022-12-15 LAB — BASIC METABOLIC PANEL
Anion gap: 9 (ref 5–15)
BUN: 11 mg/dL (ref 8–23)
CO2: 27 mmol/L (ref 22–32)
Calcium: 8.6 mg/dL — ABNORMAL LOW (ref 8.9–10.3)
Chloride: 101 mmol/L (ref 98–111)
Creatinine, Ser: 0.68 mg/dL (ref 0.61–1.24)
GFR, Estimated: >60 mL/min (ref >60)
Glucose, Bld: 99 mg/dL (ref 70–99)
Potassium: 3.9 mmol/L (ref 3.5–5.1)
Sodium: 137 mmol/L (ref 135–145)

## 2022-12-15 MED ORDER — PROCHLORPERAZINE EDISYLATE 10 MG/2ML IJ SOLN
5.0000 mg | Freq: Once | INTRAMUSCULAR | Status: DC
  Filled 2022-12-15: qty 2

## 2022-12-15 MED ORDER — DIPHENHYDRAMINE HCL 50 MG/ML IJ SOLN
12.5000 mg | Freq: Once | INTRAMUSCULAR | Status: AC
  Administered 2022-12-15: 12.5 mg via INTRAVENOUS

## 2022-12-15 MED ORDER — DIPHENHYDRAMINE HCL 50 MG/ML IJ SOLN
12.5000 mg | Freq: Once | INTRAMUSCULAR | Status: DC
  Administered 2022-12-15: 12.5 mg via INTRAVENOUS
  Filled 2022-12-15: qty 1

## 2022-12-15 MED ORDER — PROCHLORPERAZINE EDISYLATE 10 MG/2ML IJ SOLN
5.0000 mg | Freq: Once | INTRAMUSCULAR | Status: AC
  Administered 2022-12-15: 5 mg via INTRAVENOUS

## 2022-12-15 MED ORDER — CLONIDINE HCL 0.1 MG PO TABS
0.1000 mg | ORAL_TABLET | Freq: Once | ORAL | Status: AC
  Administered 2022-12-15: 0.1 mg via ORAL
  Filled 2022-12-15: qty 1

## 2022-12-15 MED ORDER — HYDRALAZINE HCL 20 MG/ML IJ SOLN
10.0000 mg | Freq: Once | INTRAMUSCULAR | Status: AC
  Administered 2022-12-15: 10 mg via INTRAVENOUS
  Filled 2022-12-15: qty 1

## 2022-12-15 NOTE — ED Provider Notes (Signed)
  Physical Exam  BP (!) 163/76   Pulse (!) 45   Temp 97.9 F (36.6 C) (Oral)   Resp 13   Ht 5\' 6"  (1.676 m)   Wt 86.2 kg   SpO2 97%   BMI 30.67 kg/m   Physical Exam  Procedures  Procedures  ED Course / MDM    Medical Decision Making Care assumed at 3 PM.  Patient has uncontrolled hypertension and has headaches today.  PCP sent patient in for further evaluation.  Patient denies any chest pain.  Signout pending CT head and migraine cocktail.  3:54 PM CT head showed no bleeding.  Headache has improved after migraine cocktail.  Patient blood pressure is also down to the 130s to 140s.  I reviewed patient's chart from earlier today.  PCP already increase hydralazine to 100 mg 3 times daily.  I told him to take hydralazine and his other BP meds as prescribed.  He also should follow-up with PCP in a week.  Problems Addressed: Uncontrolled hypertension: acute illness or injury  Amount and/or Complexity of Data Reviewed Labs: ordered. Decision-making details documented in ED Course. Radiology: ordered and independent interpretation performed. Decision-making details documented in ED Course.  Risk Prescription drug management.         Charlynne Pander, MD 12/15/22 [phone]

## 2022-12-15 NOTE — ED Notes (Signed)
Patient transported to CT 

## 2022-12-15 NOTE — ED Notes (Signed)
Cereal/Milk  Water

## 2022-12-15 NOTE — ED Notes (Signed)
Pt. Tolerated PO fluids well. No coughing, choking or signs of distress.

## 2022-12-15 NOTE — ED Triage Notes (Signed)
Pt states his BP has been high BP and was seen by PCP today.  PCP sent pt to ED.  Pt has been taking his medication as directed.  Pt having headache and pressure in his eyes.   No Chest pain, no sob.

## 2022-12-15 NOTE — Discharge Instructions (Signed)
As we discussed, your CT head and also labs were unremarkable today  You need to take your blood pressure medicine as prescribed by your doctor.  In particular your doctor increase your hydralazine to 100 mg 3 times daily and you need to pick up the prescription and take as prescribed  You also need to follow-up with your doctor in a week and please keep a very detailed blood pressure log  Return to ER if you have severe headaches or chest pain or numbness or weakness or trouble speaking.

## 2022-12-15 NOTE — ED Notes (Signed)
Fall risk sign on door Fall risk armband Fall risk socks

## 2022-12-15 NOTE — ED Provider Notes (Signed)
[facility] Provider Note   CSN: [id number] Arrival date & time: 12/15/22  1129     History  Chief Complaint  Patient presents with   Hypertension    Christian [name] is a 62 y.o. male.  62 yo M with a chief complaints of high blood pressure.  He has noticed this for the past few weeks.  He made an appointment with his doctor and saw them in the office.  They are worried about the number and told him that he would die imminently if he did not go to the emergency department and so sent him here to be evaluated.  He told me that he has had headaches off and on.  Has a headache currently.  He thinks it is may be worse whenever his blood pressure is up.  He is not really sure.  He denies head injury denies one-sided numbness or weakness denies difficulty speech or swallowing.  He denies recent change to his medications.   Hypertension       Home Medications Prior to Admission medications   Medication Sig Start Date End Date Taking? Authorizing Provider  apixaban (ELIQUIS) 5 MG TABS tablet Take 1 tablet (5 mg total) by mouth 2 (two) times daily. 10/31/22 12/04/22  Tilden Fossa, MD  carvedilol (COREG) 25 MG tablet Take 1 tablet (25 mg total) by mouth 2 (two) times daily. 12/04/22 03/04/23  Gaston Islam., NP  cloNIDine (CATAPRES) 0.1 MG tablet Take 1 tablet (0.1 mg total) by mouth 2 (two) times daily. 11/20/22   Eustace Pen, PA-C  diltiazem (CARDIZEM CD) 120 MG 24 hr capsule Take 1 capsule (120 mg total) by mouth daily. 11/08/22   Amin, Ankit C, MD  DULoxetine (CYMBALTA) 30 MG capsule Take 30 mg by mouth 3 (three) times daily.    [provider]  gabapentin (NEURONTIN) 800 MG tablet Take 800 mg by mouth in the morning, at noon, and at bedtime.    [provider]  hydrALAZINE (APRESOLINE) 50 MG tablet Take 1 tablet (50 mg total) by mouth in the morning, at noon, and at bedtime. 12/11/22   Gaston Islam., NP   HYDROcodone-acetaminophen (NORCO/VICODIN) 5-325 MG tablet Take 1 tablet by mouth 2 (two) times daily as needed. 12/22/18   [provider]  losartan (COZAAR) 100 MG tablet Take 100 mg by mouth in the morning.    [provider]  lovastatin (MEVACOR) 40 MG tablet Take 40 mg by mouth at bedtime.    [provider]  meloxicam (MOBIC) 15 MG tablet Take 15 mg by mouth daily. Patient not taking: Reported on 12/04/2022    [provider]  pantoprazole (PROTONIX) 40 MG tablet Take 40 mg by mouth every evening.    [provider]  Semaglutide (OZEMPIC, 0.25 OR 0.5 MG/DOSE, [place]) Inject 25 mg into the skin once a week.    [provider]  tiZANidine (ZANAFLEX) 4 MG tablet Take 4 mg by mouth in the morning, at noon, and at bedtime. 07/11/21   [provider]      Allergies    Pork allergy and Pork-derived products    Review of Systems   Review of Systems  Physical Exam Updated Vital Signs BP (!) 210/86   Pulse (!) 45   Temp 97.9 F (36.6 C) (Oral)   Resp 13   Ht 5\' 6"  (1.676 m)   Wt 86.2 kg   SpO2 97%  BMI 30.67 kg/m  Physical Exam Vitals and nursing note reviewed.  Constitutional:      Appearance: He is well-developed.  HENT:     Head: Normocephalic and atraumatic.  Eyes:     Pupils: Pupils are equal, round, and reactive to light.  Neck:     Vascular: No JVD.  Cardiovascular:     Rate and Rhythm: Normal rate and regular rhythm.     Heart sounds: No murmur heard.    No friction rub. No gallop.  Pulmonary:     Effort: No respiratory distress.     Breath sounds: No wheezing.  Abdominal:     General: There is no distension.     Tenderness: There is no abdominal tenderness. There is no guarding or rebound.  Musculoskeletal:        General: Normal range of motion.     Cervical back: Normal range of motion and neck supple.  Skin:    Coloration: Skin is not pale.     Findings: No rash.  Neurological:     Mental Status:  He is alert and oriented to person, place, and time.     Cranial Nerves: Cranial nerves 2-12 are intact.     Sensory: Sensation is intact.     Motor: Motor function is intact.     Coordination: Coordination is intact.     Comments: Patient is able to ambulate without instability.  Has a painful gait that he says is due to both of his knees hurting him.    Psychiatric:        Behavior: Behavior normal.     ED Results / Procedures / Treatments   Labs (all labs ordered are listed, but only abnormal results are displayed) Labs Reviewed - No data to display   EKG None  Radiology No results found.  Procedures Procedures    Medications Ordered in ED Medications  diphenhydrAMINE (BENADRYL) injection 12.5 mg (has no administration in time range)  prochlorperazine (COMPAZINE) injection 5 mg (5 mg Intravenous Given 12/15/22 1427)    ED Course/ Medical Decision Making/ A&P                                 Medical Decision Making Amount and/or Complexity of Data Reviewed Labs: ordered. Radiology: ordered.  Risk Prescription drug management.   62 yo M with a chief complaints of high blood pressure.  He has been having some headaches off and on.  Was seen by his family doctor today and encouraged to come to the ED for evaluation.  He was given multiple medicines in their office earlier today and now he feels a bit lightheaded.  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  He is on both clonidine and hydralazine as well as he was recently changed from metoprolol to Coreg in his cardiologist office about 2 weeks ago.  He has a benign neurologic exam for me.  With new onset headaches over the past few weeks will obtain CT imaging.  Give a headache cocktail.  Signed out to Dr. Silverio Lay, please see their note for further details of care in the ED.    Patient  with no noted s/s of end organ damage.  No chest pain, diaphoresis, nausea or other acs  symptoms.  No unequal pulses, normal pulse ox without rales or sob.  Feel this is unlikely to be a Hypertensive Emergency and  recent studies suggest no benefit for inpatient admission.  There are also no studies to my knowledge suggesting that patients with hypertensive urgency have increased risk for end organ disease. In fact there has been a study recently that would suggest that the rapid change can induce harm.  The [employer] policy statement on asymptomatic hypertension does not  recommend routing ED medical intervention. The patient will follow up closely with their PCP.  Compliance with their medication stressed.   The management of elevated blood pressure in the acute care setting: a scientific statement from the American Heart Association Bress AP, Jena Gauss, Flack JM, et al. Hypertension. May 2024. doi: 10.1161/HYP.0000000000000238  Chester Holstein, Christell Constant EH, et al. Characteristics and outcomes of patients presenting with hypertensive urgency in the office setting. JAMA Intern Med. 2016 Jul 1; 176(7): 981-8.   Cerebrovascular risks with rapid blood pressure lowering in the absence of hypertensive emergency Laverle Hobby, Ronalee Red, et al. Am J Emerg Med. 2019;37(6):1073-1077.          Final Clinical Impression(s) / ED Diagnoses Final diagnoses:  Uncontrolled hypertension    Rx / DC Orders ED Discharge Orders     None         Melene Plan, DO 12/15/22 1459

## 2022-12-19 ENCOUNTER — Institutional Professional Consult (permissible substitution): Payer: Medicare Other | Admitting: Cardiovascular Disease

## 2022-12-25 ENCOUNTER — Ambulatory Visit: Payer: Medicare Other | Admitting: Internal Medicine

## 2023-01-04 NOTE — Progress Notes (Deleted)
Cardiology Office Note    Patient Name: Christian [name] Date of Encounter: 01/04/2023  Primary Care Provider:  Laqueta Due., MD Primary Cardiologist:  Armanda Magic, MD Primary Electrophysiologist: None   Past Medical History    Past Medical History:  Diagnosis Date   Chronic back pain    Chronic knee pain    DDD (degenerative disc disease), lumbosacral    History of kidney stones    Hyperlipidemia    Hypertension    Kidney stone    OA (osteoarthritis)    knees   Spinal stenosis, lumbar region with neurogenic claudication    Weakness of extremity    per pt upper and lower extermities due to back issues    History of Present Illness  Christian [name] is a 62 y.o. male with a PMH of AF with RVR, HTN, HLD, osteoarthritis, OSA (noncompliant with CPAP) chronic back pain who presents today for 1 month follow-up of hypertension.  Christian [name] was last seen on 12/04/2022 with complaint of elevated BP.  During visit patient's blood pressures were 172/68 and 168/72.  He was maintaining sinus rhythm on any episodes of AF.  Previously discussed smoking cessation and reviewed its role in elevating his blood pressures.  Metoprolol was discontinued and carvedilol 25 mg twice daily was added with continuation of clonidine 0.1 mg twice daily and losartan as well as hydralazine 25 mg 3 times daily.  Patient contacted our office on 12/11/2022 with complaint of headache and elevated BP.  He was advised to increase his hydralazine to 50 mg 3 times a day.  He continue to have headache complaints and was seen in the ED on 12/15/2022.  He had been seen earlier that day by his PCP hydralazine was increased to 100 mg 3 times daily.  In the ED he underwent a CT of the head that showed no evidence of new acute changes and was given a headache cocktail.  Blood pressure decreased to the 130s to the 140s systolically and was advised to follow-up with his PCP and continue current medications.  He was seen by Dr. Beverely Pace  cardiologist on 12/23/2022 and cholesterol was discontinued and metoprolol 50 mg twice daily was initiated.  Hydralazine was decreased to 50 mg 3 times daily and spironolactone 25 was added.   During today's visit the patient reports*** .  Patient denies chest pain, palpitations, dyspnea, PND, orthopnea, nausea, vomiting, dizziness, syncope, edema, weight gain, or early satiety.  ***Notes: -Last ischemic evaluation: -Last echo: -Interim ED visits: Review of Systems  Please see the history of present illness.    All other systems reviewed and are otherwise negative except as noted above.  Physical Exam    Wt Readings from Last 3 Encounters:  12/15/22 190 lb (86.2 kg)  12/04/22 196 lb 3.2 oz (89 kg)  11/20/22 195 lb 6.4 oz (88.6 kg)   ZO:[id number] were no vitals filed for this visit.,There is no height or weight on file to calculate BMI. GEN: Well nourished, well developed in no acute distress Neck: No JVD; No carotid bruits Pulmonary: Clear to auscultation without rales, wheezing or rhonchi  Cardiovascular: Normal rate. Regular rhythm. Normal S1. Normal S2.   Murmurs: There is no murmur.  ABDOMEN: Soft, non-tender, non-distended EXTREMITIES:  No edema; No deformity   EKG/LABS/ Recent Cardiac Studies   ECG personally reviewed by me today - ***  Risk Assessment/Calculations:   {Does this patient have ATRIAL FIBRILLATION?:[phone]}      Lab Results  Component  Value Date   WBC 3.8 (L) 12/15/2022   HGB 15.1 12/15/2022   HCT 45.2 12/15/2022   MCV 87.9 12/15/2022   PLT 223 12/15/2022   Lab Results  Component Value Date   CREATININE 0.68 12/15/2022   BUN 11 12/15/2022   NA 137 12/15/2022   K 3.9 12/15/2022   CL 101 12/15/2022   CO2 27 12/15/2022   Lab Results  Component Value Date   CHOL 146 11/04/2022   HDL 44 11/04/2022   LDLCALC 80 11/04/2022   TRIG 111 11/04/2022   CHOLHDL 3.3 11/04/2022    Lab Results  Component Value Date   HGBA1C 5.8 (H) 11/04/2022    Assessment & Plan    1.***  2.***  3.***  4.***      Disposition: Follow-up with Armanda Magic, MD or APP in *** months {Are you ordering a CV Procedure (e.g. stress test, cath, DCCV, TEE, etc)?   Press F2        :[id number]}   Signed, Napoleon Form, Leodis Rains, NP 01/04/2023, 2:05 PM [facility]

## 2023-01-05 ENCOUNTER — Ambulatory Visit: Payer: Medicare Other | Attending: Nurse Practitioner | Admitting: Nurse Practitioner

## 2023-01-15 ENCOUNTER — Ambulatory Visit (HOSPITAL_COMMUNITY): Payer: Medicare Other | Admitting: Internal Medicine

## 2023-01-27 ENCOUNTER — Telehealth (HOSPITAL_COMMUNITY): Payer: Self-pay | Admitting: *Deleted

## 2023-01-27 NOTE — Telephone Encounter (Signed)
Reaching out to patient to offer assistance regarding upcoming cardiac imaging study; patient wishes to cancel study and does not wish to reschedule.  Reports establishing care with another doctor.   Appt cancelled.   Johney Frame RN Navigator Cardiac Imaging [facility] [phone] office [phone] cell

## 2023-01-28 ENCOUNTER — Ambulatory Visit (HOSPITAL_COMMUNITY): Payer: Medicare Other

## 2023-03-07 ENCOUNTER — Other Ambulatory Visit (HOSPITAL_COMMUNITY): Payer: Self-pay | Admitting: Internal Medicine

## 2023-04-13 ENCOUNTER — Other Ambulatory Visit: Payer: Self-pay | Admitting: Nurse Practitioner

## 2023-07-06 ENCOUNTER — Other Ambulatory Visit (HOSPITAL_COMMUNITY): Payer: Self-pay | Admitting: Cardiology

## 2024-01-14 ENCOUNTER — Other Ambulatory Visit: Payer: Self-pay | Admitting: Nurse Practitioner

## 2024-01-21 ENCOUNTER — Other Ambulatory Visit: Payer: Self-pay | Admitting: Cardiology

## 2024-02-14 ENCOUNTER — Other Ambulatory Visit: Payer: Self-pay | Admitting: Cardiology

## 2024-03-08 ENCOUNTER — Other Ambulatory Visit: Payer: Self-pay | Admitting: Cardiology

## 2024-03-09 MED ORDER — CLONIDINE HCL 0.1 MG PO TABS: 0.1000 mg | ORAL_TABLET | Freq: Two times a day (BID) | ORAL | 0 refills | Status: AC

## 2024-03-09 NOTE — Addendum Note (Signed)
 Addended by: DARIO IZETTA CROME on: 03/09/2024 04:33 PM   Modules accepted: Orders
